# Patient Record
Sex: Female | Born: 1995 | Race: White | Hispanic: No | Marital: Single | Smoking: Current every day smoker
Health system: Southern US, Community
[De-identification: ages and names within clinical notes are randomized; demographics above are authoritative.]

## PROBLEM LIST (undated history)

## (undated) ENCOUNTER — Inpatient Hospital Stay: Payer: Self-pay

## (undated) DIAGNOSIS — F1220 Cannabis dependence, uncomplicated: Secondary | ICD-10-CM

## (undated) DIAGNOSIS — Z7289 Other problems related to lifestyle: Secondary | ICD-10-CM

## (undated) DIAGNOSIS — F17200 Nicotine dependence, unspecified, uncomplicated: Secondary | ICD-10-CM

## (undated) DIAGNOSIS — Z789 Other specified health status: Secondary | ICD-10-CM

## (undated) DIAGNOSIS — N939 Abnormal uterine and vaginal bleeding, unspecified: Secondary | ICD-10-CM

## (undated) DIAGNOSIS — F321 Major depressive disorder, single episode, moderate: Secondary | ICD-10-CM

## (undated) HISTORY — DX: Abnormal uterine and vaginal bleeding, unspecified: N93.9

## (undated) HISTORY — PX: WISDOM TOOTH EXTRACTION: SHX21

## (undated) HISTORY — PX: ORTHOPEDIC SURGERY: SHX850

---
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
## 2015-11-20 ENCOUNTER — Encounter: Payer: Self-pay | Admitting: Emergency Medicine

## 2015-11-20 ENCOUNTER — Emergency Department
Admission: EM | Admit: 2015-11-20 | Discharge: 2015-11-20 | Disposition: A | Discharge status: Home / Self Care | Payer: Self-pay | Attending: Emergency Medicine | Admitting: Emergency Medicine

## 2015-11-20 DIAGNOSIS — R112 Nausea with vomiting, unspecified: Secondary | ICD-10-CM | POA: Insufficient documentation

## 2015-11-20 DIAGNOSIS — F17200 Nicotine dependence, unspecified, uncomplicated: Secondary | ICD-10-CM | POA: Insufficient documentation

## 2015-11-20 DIAGNOSIS — J069 Acute upper respiratory infection, unspecified: Secondary | ICD-10-CM | POA: Insufficient documentation

## 2015-11-20 LAB — POCT RAPID STREP A: Streptococcus, Group A Screen (Direct): NEGATIVE

## 2015-11-20 MED ORDER — BENZONATATE 100 MG PO CAPS: 200.0000 mg | ORAL_CAPSULE | Freq: Three times a day (TID) | ORAL | 0 refills | Status: DC | PRN

## 2015-11-20 NOTE — Discharge Instructions (Signed)
[facility] if any continued problems. Increase fluids. Take Tessalon as needed for cough. Take Tylenol or ibuprofen if needed for throat pain or fever.

## 2015-11-20 NOTE — ED Notes (Signed)
Pt to ed with c/o sore throat x 3 days, cough, congestion, white colored sputum, denies fever.  Pt in no acute respiratory distress at this time.  Skin warm and dry.

## 2015-11-20 NOTE — ED Triage Notes (Addendum)
Pt with sore throat x 1 week with fever a couple of days ago. Woke up this am with nausea and emesis x 1. Non productive cough x 3-4 days.

## 2015-11-20 NOTE — ED Provider Notes (Signed)
[facility] Emergency Department Provider Note   ____________________________________________   First MD Initiated Contact with Patient 11/20/15 1216     (approximate)  I have reviewed the triage vital signs and the nursing notes.   HISTORY  Chief Complaint Nausea and Emesis   HPI [name] is a 20 y.o. female is here with complaint of sore throat for [redacted] week along with fever for couple days. Patient states that there has been some nausea and she has vomited once. She also has a runny nose and a nonproductive cough for the last 3-4 days.Patient is not taking any over-the-counter medication. She states that when she did vomit it was during a "coughing spell". She states cough is worse at night.   History reviewed. No pertinent past medical history.  There are no active problems to display for this patient.   Past Surgical History:  Procedure Laterality Date  . ORTHOPEDIC SURGERY Right arm    Prior to Admission medications   Medication Sig Start Date End Date Taking? Authorizing Provider  benzonatate (TESSALON PERLES) 100 MG capsule Take 2 capsules (200 mg total) by mouth 3 (three) times daily as needed for cough. 11/20/15 11/19/16  Tommi Rumps, PA-C  fluconazole (DIFLUCAN) 150 MG tablet Take 1 tablet (150 mg total) by mouth daily. 12/19/14   Charmayne Sheer Beers, PA-C  sulfamethoxazole-trimethoprim (BACTRIM DS,SEPTRA DS) 800-160 MG per tablet Take 1 tablet by mouth 2 (two) times daily. 12/19/14   Evangeline Dakin, PA-C    Allergies Review of patient's allergies indicates no known allergies.  History reviewed. No pertinent family history.  Social History Social History  Substance Use Topics  . Smoking status: Current Every Day Smoker  . Smokeless tobacco: Not on file  . Alcohol use No    Review of Systems Constitutional: Positive fever/negative chills Eyes: No visual changes. ENT: Positive sore throat. Cardiovascular: Denies chest  pain. Respiratory: Denies shortness of breath. Gastrointestinal: No abdominal pain.  Positive nausea, positive vomiting 1.  Genitourinary: Negative for dysuria. Musculoskeletal: Negative for back pain. Skin: Negative for rash. Neurological: Negative for headaches, focal weakness or numbness.  10-point ROS otherwise negative.  ____________________________________________   PHYSICAL EXAM:  VITAL SIGNS: ED Triage Vitals  Enc Vitals Group     BP 11/20/15 1202 121/70     Pulse Rate 11/20/15 1202 (!) 117     Resp --      Temp 11/20/15 1202 98.6 F (37 C)     Temp Source 11/20/15 1202 Oral     SpO2 11/20/15 1202 92 %     Weight 11/20/15 1207 90 lb (40.8 kg)     Height 11/20/15 1207  (1.626 m)     Head Circumference --      Peak Flow --      Pain Score --      Pain Loc --      Pain Edu? --      Excl. in GC? --     Constitutional: Alert and oriented. Well appearing and in no acute distress. Eyes: Conjunctivae are normal. PERRL. EOMI. Head: Atraumatic. Nose: Mild congestion/rhinnorhea.  EACs are clear bilaterally. TMs are dull bilaterally without erythema or injection. Mouth/Throat: Mucous membranes are moist.  Oropharynx non-erythematous. No exudate or tonsillar enlargement noted. Neck: No stridor.   Hematological/Lymphatic/Immunilogical: No cervical lymphadenopathy. Cardiovascular: Normal rate, regular rhythm. Grossly normal heart sounds.  Good peripheral circulation. Respiratory: Normal respiratory effort.  No retractions. Lungs CTAB. Musculoskeletal:Moves upper and lower extremities  without any difficulty. Normal gait was noted. Neurologic:  Normal speech and language. No gross focal neurologic deficits are appreciated. No gait instability. Skin:  Skin is warm, dry and intact. No rash noted. Psychiatric: Mood and affect are normal. Speech and behavior are normal.  ____________________________________________   LABS (all labs ordered are listed, but only abnormal  results are displayed)  Labs Reviewed  POCT RAPID STREP A     PROCEDURES  Procedure(s) performed: None  Procedures  Critical Care performed: No  ____________________________________________   INITIAL IMPRESSION / ASSESSMENT AND PLAN / ED COURSE  Pertinent labs & imaging results that were available during my care of the patient were reviewed by me and considered in my medical decision making (see chart for details).    Clinical Course   Patient is encouraged take Tylenol or ibuprofen if needed for throat pain. She is to increase fluids. She is also given a prescription for Tessalon Perles 1 or 2 every 8 hours if needed for cough. [facility] if any continued problems. Patient requested a note for work as she did not go to work today.  ____________________________________________   FINAL CLINICAL IMPRESSION(S) / ED DIAGNOSES  Final diagnoses:  Acute upper respiratory infection      NEW MEDICATIONS STARTED DURING THIS VISIT:  New Prescriptions   BENZONATATE (TESSALON PERLES) 100 MG CAPSULE    Take 2 capsules (200 mg total) by mouth 3 (three) times daily as needed for cough.     Note:  This document was prepared using Dragon voice recognition software and may include unintentional dictation errors.    Tommi Rumpshonda L Summers, PA-C 11/20/15 1302    Emily FilbertJonathan E Williams, MD 11/20/15 [phone]

## 2016-05-03 ENCOUNTER — Emergency Department
Admission: EM | Admit: 2016-05-03 | Discharge: 2016-05-03 | Disposition: A | Discharge status: Home / Self Care | Payer: Self-pay | Attending: Emergency Medicine | Admitting: Emergency Medicine

## 2016-05-03 ENCOUNTER — Encounter: Payer: Self-pay | Admitting: Emergency Medicine

## 2016-05-03 DIAGNOSIS — F17200 Nicotine dependence, unspecified, uncomplicated: Secondary | ICD-10-CM | POA: Insufficient documentation

## 2016-05-03 DIAGNOSIS — J101 Influenza due to other identified influenza virus with other respiratory manifestations: Secondary | ICD-10-CM

## 2016-05-03 DIAGNOSIS — J09X2 Influenza due to identified novel influenza A virus with other respiratory manifestations: Secondary | ICD-10-CM | POA: Insufficient documentation

## 2016-05-03 DIAGNOSIS — J111 Influenza due to unidentified influenza virus with other respiratory manifestations: Secondary | ICD-10-CM

## 2016-05-03 LAB — INFLUENZA PANEL BY PCR (TYPE A & B)
Influenza A By PCR: POSITIVE — AB
Influenza B By PCR: NEGATIVE

## 2016-05-03 MED ORDER — OSELTAMIVIR PHOSPHATE 75 MG PO CAPS: 75.0000 mg | ORAL_CAPSULE | Freq: Two times a day (BID) | ORAL | 0 refills | Status: AC

## 2016-05-03 MED ORDER — SODIUM CHLORIDE 0.9 % IV BOLUS (SEPSIS)
1000.0000 mL | Freq: Once | INTRAVENOUS | Status: AC
  Administered 2016-05-03: 1000 mL via INTRAVENOUS

## 2016-05-03 MED ORDER — BENZONATATE 100 MG PO CAPS: 100.0000 mg | ORAL_CAPSULE | Freq: Three times a day (TID) | ORAL | 0 refills | Status: AC | PRN

## 2016-05-03 NOTE — ED Notes (Signed)

## 2016-05-03 NOTE — ED Provider Notes (Signed)
[facility] Emergency Department Provider Note  ____________________________________________  Time seen: Approximately 4:27 PM  I have reviewed the triage vital signs and the nursing notes.   HISTORY  Chief Complaint Generalized Body Aches    HPI [name] is a 21 y.o. female presenting to the emergency department with rhinorrhea and nonproductive cough, malaise, fatigue, congestion, fever, frontal headache and vomiting for the past 2 days. Fever has been as high as 101F assessed orally. Patient has tried ibuprofen but has attempted no other alleviating measures. Patient has had a diminished appetite and has not been drinking as much as usual. No recent travel. She denies chest pain, chest tightness, nausea, dysuria, hematuria or diarrhea. Patient has numerous sick contacts at school. Patient is currently studying to be a Engineer, civil (consulting)nurse.    History reviewed. No pertinent past medical history.  There are no active problems to display for this patient.   Past Surgical History:  Procedure Laterality Date  . ORTHOPEDIC SURGERY Right arm    Prior to Admission medications   Medication Sig Start Date End Date Taking? Authorizing Provider  benzonatate (TESSALON PERLES) 100 MG capsule Take 1 capsule (100 mg total) by mouth 3 (three) times daily as needed for cough. 05/03/16 05/10/16  Orvil FeilJaclyn M Wynette Jersey, PA-C  fluconazole (DIFLUCAN) 150 MG tablet Take 1 tablet (150 mg total) by mouth daily. 12/19/14   Evangeline Dakinharles M Beers, PA-C  oseltamivir (TAMIFLU) 75 MG capsule Take 1 capsule (75 mg total) by mouth 2 (two) times daily. 05/03/16 05/08/16  Orvil FeilJaclyn M Luvena Wentling, PA-C  sulfamethoxazole-trimethoprim (BACTRIM DS,SEPTRA DS) 800-160 MG per tablet Take 1 tablet by mouth 2 (two) times daily. 12/19/14   Evangeline Dakinharles M Beers, PA-C    Allergies Patient has no known allergies.  History reviewed. No pertinent family history.  Social History Social History  Substance Use Topics  . Smoking status: Current  Every Day Smoker  . Smokeless tobacco: Not on file  . Alcohol use No     Review of Systems  Constitutional: Patient has had fever.  Eyes: No visual changes. No discharge ENT: Patient has had congestion.  Cardiovascular: no chest pain. Respiratory: Patient has had non-productive cough.  No SOB. Gastrointestinal: Patient has had nausea and vomiting Genitourinary: Negative for dysuria. No hematuria Musculoskeletal: Patient has had myalgias. Skin: Negative for rash, abrasions, lacerations, ecchymosis. Neurological: Patient has had headache, no focal weakness or numbness. 10-point ROS otherwise negative. ____________________________________________   PHYSICAL EXAM:  VITAL SIGNS: ED Triage Vitals  Enc Vitals Group     BP 05/03/16 1503 116/81     Pulse Rate 05/03/16 1503 (!) 135     Resp 05/03/16 1503 18     Temp 05/03/16 1503 98.4 F (36.9 C)     Temp Source 05/03/16 1503 Oral     SpO2 05/03/16 1503 98 %     Weight 05/03/16 1501 90 lb (40.8 kg)     Height 05/03/16 1501 5\' 4"  (1.626 m)     Head Circumference --      Peak Flow --      Pain Score 05/03/16 1501 8     Pain Loc --      Pain Edu? --      Excl. in GC? --    Constitutional: Alert and oriented. Patient is lying supine in bed.  Eyes: Conjunctivae are normal. PERRL. EOMI. Head: Atraumatic. ENT:      Ears: Tympanic membranes are injected bilaterally without evidence of effusion or purulent exudate. Bony landmarks are  visualized bilaterally. No pain with palpation at the tragus.      Nose: Nasal turbinates are edematous and erythematous. Trace rhinorrhea visualized.      Mouth/Throat: Mucous membranes are moist. Posterior pharynx is mildly erythematous. No tonsillar hypertrophy or purulent exudate. Uvula is midline. Neck: Full range of motion. No pain is elicited with flexion at the neck. Hematological/Lymphatic/Immunilogical: No cervical lymphadenopathy. Cardiovascular: Tachycardia, regular rhythm. Normal S1 and S2.   Good peripheral circulation. Respiratory: Normal respiratory effort without tachypnea or retractions. Lungs CTAB. Good air entry to the bases with no decreased or absent breath sounds. Gastrointestinal: Bowel sounds 4 quadrants. Soft and nontender to palpation. No guarding or rigidity. No palpable masses. No distention. No CVA tenderness.  Skin:  Skin is warm, dry and intact. No rash noted. Psychiatric: Mood and affect are normal. Speech and behavior are normal. Patient exhibits appropriate insight and judgement.   ____________________________________________   LABS (all labs ordered are listed, but only abnormal results are displayed)  Labs Reviewed  INFLUENZA PANEL BY PCR (TYPE A & B) - Abnormal; Notable for the following:       Result Value   Influenza A By PCR POSITIVE (*)    All other components within normal limits   ____________________________________________  EKG   ____________________________________________  RADIOLOGY   No results found.  ____________________________________________    PROCEDURES  Procedure(s) performed:    Procedures    Medications  sodium chloride 0.9 % bolus 1,000 mL (0 mLs Intravenous Stopped 05/03/16 1731)     ____________________________________________   INITIAL IMPRESSION / ASSESSMENT AND PLAN / ED COURSE  Pertinent labs & imaging results that were available during my care of the patient were reviewed by me and considered in my medical decision making (see chart for details).  Review of the [place] CSRS was performed in accordance of the [place] prior to dispensing any controlled drugs.     Assessment and Plan: Influenza A Patient presents to the emergency department with rhinorrhea,nonproductive cough, malaise, fatigue, congestion, fever, frontal headache and vomiting for the past 2 days. Patient tested positive for influenza A in the emergency department. She received IV fluids for suspected dehydration. Tachycardia largely  resolved after supplemental fluids. Rest and hydration was encouraged. Patient was advised to follow-up with her PCP in one week. Strict return precautions were given to patient twice. All patient questions were answered. Physical exam and vital signs are reassuring at this time. ____________________________________________  FINAL CLINICAL IMPRESSION(S) / ED DIAGNOSES  Final diagnoses:  Influenza  Influenza A      NEW MEDICATIONS STARTED DURING THIS VISIT:  Discharge Medication List as of 05/03/2016  5:35 PM    START taking these medications   Details  oseltamivir (TAMIFLU) 75 MG capsule Take 1 capsule (75 mg total) by mouth 2 (two) times daily., Starting Sun 05/03/2016, Until Fri 05/08/2016, Print            This chart was dictated using voice recognition software/Dragon. Despite best efforts to proofread, errors can occur which can change the meaning. Any change was purely unintentional.    Orvil Feil, PA-C 05/03/16 2243    Nita Sickle, MD 05/05/16 [phone]

## 2016-05-03 NOTE — ED Triage Notes (Signed)
Pt c/o fever of 101, body aches, congestion, runny nose and cough for 2 days. Ambulatory to triage.

## 2016-05-03 NOTE — ED Notes (Signed)
Pt presents to ED with c/o generalized body aches that started approx 2 days ago. Pt c/o fever and vomiting that started yesterday. Pt reports 1 episode of vomiting yesterday. Pt also c/o sore throat and cough at this time.

## 2016-09-17 ENCOUNTER — Encounter: Payer: Self-pay | Admitting: Emergency Medicine

## 2016-09-17 ENCOUNTER — Emergency Department
Admission: EM | Admit: 2016-09-17 | Discharge: 2016-09-17 | Disposition: A | Discharge status: Other Acute Inpatient Hospital | Payer: Self-pay | Attending: Emergency Medicine | Admitting: Emergency Medicine

## 2016-09-17 ENCOUNTER — Inpatient Hospital Stay
Admission: EM | Admit: 2016-09-17 | Discharge: 2016-09-19 | DRG: 885 | Disposition: A | Discharge status: Home / Self Care | Payer: No Typology Code available for payment source | Source: Intra-hospital | Attending: Psychiatry | Admitting: Psychiatry

## 2016-09-17 DIAGNOSIS — F17200 Nicotine dependence, unspecified, uncomplicated: Secondary | ICD-10-CM | POA: Insufficient documentation

## 2016-09-17 DIAGNOSIS — R45851 Suicidal ideations: Secondary | ICD-10-CM | POA: Diagnosis present

## 2016-09-17 DIAGNOSIS — Z7289 Other problems related to lifestyle: Secondary | ICD-10-CM | POA: Diagnosis present

## 2016-09-17 DIAGNOSIS — G4700 Insomnia, unspecified: Secondary | ICD-10-CM | POA: Diagnosis present

## 2016-09-17 DIAGNOSIS — F1220 Cannabis dependence, uncomplicated: Secondary | ICD-10-CM | POA: Diagnosis present

## 2016-09-17 DIAGNOSIS — N760 Acute vaginitis: Secondary | ICD-10-CM | POA: Diagnosis present

## 2016-09-17 DIAGNOSIS — S61519A Laceration without foreign body of unspecified wrist, initial encounter: Secondary | ICD-10-CM | POA: Diagnosis present

## 2016-09-17 DIAGNOSIS — Z915 Personal history of self-harm: Secondary | ICD-10-CM | POA: Insufficient documentation

## 2016-09-17 DIAGNOSIS — X789XXA Intentional self-harm by unspecified sharp object, initial encounter: Secondary | ICD-10-CM | POA: Diagnosis present

## 2016-09-17 DIAGNOSIS — F4310 Post-traumatic stress disorder, unspecified: Secondary | ICD-10-CM | POA: Diagnosis present

## 2016-09-17 DIAGNOSIS — Z811 Family history of alcohol abuse and dependence: Secondary | ICD-10-CM | POA: Diagnosis not present

## 2016-09-17 DIAGNOSIS — F32A Depression, unspecified: Secondary | ICD-10-CM

## 2016-09-17 DIAGNOSIS — S41112A Laceration without foreign body of left upper arm, initial encounter: Secondary | ICD-10-CM | POA: Diagnosis present

## 2016-09-17 DIAGNOSIS — Z79899 Other long term (current) drug therapy: Secondary | ICD-10-CM

## 2016-09-17 DIAGNOSIS — N390 Urinary tract infection, site not specified: Secondary | ICD-10-CM | POA: Diagnosis present

## 2016-09-17 DIAGNOSIS — IMO0002 Reserved for concepts with insufficient information to code with codable children: Secondary | ICD-10-CM

## 2016-09-17 DIAGNOSIS — F332 Major depressive disorder, recurrent severe without psychotic features: Secondary | ICD-10-CM

## 2016-09-17 DIAGNOSIS — Z818 Family history of other mental and behavioral disorders: Secondary | ICD-10-CM | POA: Diagnosis not present

## 2016-09-17 DIAGNOSIS — Z560 Unemployment, unspecified: Secondary | ICD-10-CM

## 2016-09-17 DIAGNOSIS — F329 Major depressive disorder, single episode, unspecified: Secondary | ICD-10-CM | POA: Insufficient documentation

## 2016-09-17 DIAGNOSIS — F321 Major depressive disorder, single episode, moderate: Secondary | ICD-10-CM | POA: Diagnosis present

## 2016-09-17 HISTORY — DX: Other problems related to lifestyle: Z72.89

## 2016-09-17 HISTORY — DX: Nicotine dependence, unspecified, uncomplicated: F17.200

## 2016-09-17 HISTORY — DX: Cannabis dependence, uncomplicated: F12.20

## 2016-09-17 LAB — PREGNANCY, URINE: Preg Test, Ur: NEGATIVE

## 2016-09-17 LAB — URINE DRUG SCREEN, QUALITATIVE (ARMC ONLY)
Amphetamines, Ur Screen: NOT DETECTED
Barbiturates, Ur Screen: NOT DETECTED
Benzodiazepine, Ur Scrn: POSITIVE — AB
Cannabinoid 50 Ng, Ur ~~LOC~~: POSITIVE — AB
Cocaine Metabolite,Ur ~~LOC~~: NOT DETECTED
MDMA (ECSTASY) UR SCREEN: NOT DETECTED
Methadone Scn, Ur: NOT DETECTED
OPIATE, UR SCREEN: NOT DETECTED
PHENCYCLIDINE (PCP) UR S: NOT DETECTED
Tricyclic, Ur Screen: NOT DETECTED

## 2016-09-17 LAB — CBC
HEMATOCRIT: 43.1 % (ref 35.0–47.0)
Hemoglobin: 14.8 g/dL (ref 12.0–16.0)
MCH: 32.8 pg (ref 26.0–34.0)
MCHC: 34.2 g/dL (ref 32.0–36.0)
MCV: 95.9 fL (ref 80.0–100.0)
Platelets: 212 10*3/uL (ref 150–440)
RBC: 4.5 MIL/uL (ref 3.80–5.20)
RDW: 13.4 % (ref 11.5–14.5)
WBC: 9.1 10*3/uL (ref 3.6–11.0)

## 2016-09-17 LAB — URINALYSIS, COMPLETE (UACMP) WITH MICROSCOPIC
Bacteria, UA: NONE SEEN
Bilirubin Urine: NEGATIVE
Glucose, UA: NEGATIVE mg/dL
KETONES UR: NEGATIVE mg/dL
Nitrite: NEGATIVE
Protein, ur: NEGATIVE mg/dL
Specific Gravity, Urine: 1.017 (ref 1.005–1.030)
pH: 5 (ref 5.0–8.0)

## 2016-09-17 LAB — COMPREHENSIVE METABOLIC PANEL
ALT: 12 U/L — ABNORMAL LOW (ref 14–54)
ANION GAP: 8 (ref 5–15)
AST: 22 U/L (ref 15–41)
Albumin: 5 g/dL (ref 3.5–5.0)
Alkaline Phosphatase: 71 U/L (ref 38–126)
BUN: 9 mg/dL (ref 6–20)
CO2: 24 mmol/L (ref 22–32)
Calcium: 9.4 mg/dL (ref 8.9–10.3)
Chloride: 105 mmol/L (ref 101–111)
Creatinine, Ser: 0.65 mg/dL (ref 0.44–1.00)
GFR calc non Af Amer: >60 mL/min (ref >60)
Glucose, Bld: 90 mg/dL (ref 65–99)
Potassium: 3.5 mmol/L (ref 3.5–5.1)
SODIUM: 137 mmol/L (ref 135–145)
Total Bilirubin: 0.8 mg/dL (ref 0.3–1.2)
Total Protein: 8.6 g/dL — ABNORMAL HIGH (ref 6.5–8.1)

## 2016-09-17 LAB — ETHANOL: Alcohol, Ethyl (B): <5 mg/dL (ref <5)

## 2016-09-17 MED ORDER — ALUM & MAG HYDROXIDE-SIMETH 200-200-20 MG/5ML PO SUSP: 30.0000 mL | ORAL | Status: DC | PRN

## 2016-09-17 MED ORDER — HYDROXYZINE HCL 25 MG PO TABS
25.0000 mg | ORAL_TABLET | Freq: Three times a day (TID) | ORAL | Status: DC | PRN
  Administered 2016-09-17: 25 mg via ORAL
  Filled 2016-09-17: qty 1

## 2016-09-17 MED ORDER — MAGNESIUM HYDROXIDE 400 MG/5ML PO SUSP: 30.0000 mL | Freq: Every day | ORAL | Status: DC | PRN

## 2016-09-17 MED ORDER — ACETAMINOPHEN 325 MG PO TABS
650.0000 mg | ORAL_TABLET | Freq: Four times a day (QID) | ORAL | Status: DC | PRN
  Administered 2016-09-17: 650 mg via ORAL
  Filled 2016-09-17: qty 2

## 2016-09-17 MED ORDER — FOSFOMYCIN TROMETHAMINE 3 G PO PACK
3.0000 g | PACK | Freq: Once | ORAL | Status: AC
  Administered 2016-09-17: 3 g via ORAL
  Filled 2016-09-17: qty 3

## 2016-09-17 MED ORDER — TRAZODONE HCL 100 MG PO TABS
100.0000 mg | ORAL_TABLET | Freq: Every evening | ORAL | Status: DC | PRN
  Administered 2016-09-17: 100 mg via ORAL
  Filled 2016-09-17: qty 1

## 2016-09-17 MED ORDER — NICOTINE 14 MG/24HR TD PT24
14.0000 mg | MEDICATED_PATCH | Freq: Every day | TRANSDERMAL | Status: DC
  Administered 2016-09-17: 14 mg via TRANSDERMAL
  Filled 2016-09-17: qty 1

## 2016-09-17 MED ORDER — NICOTINE 21 MG/24HR TD PT24
21.0000 mg | MEDICATED_PATCH | Freq: Every day | TRANSDERMAL | Status: DC
  Administered 2016-09-18 – 2016-09-19 (×2): 21 mg via TRANSDERMAL
  Filled 2016-09-17 (×2): qty 1
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
## 2016-09-17 NOTE — ED Notes (Signed)
BEHAVIORAL HEALTH ROUNDING Patient sleeping: No. Patient alert and oriented: yes Behavior appropriate: Yes.  ; If no, describe:  Nutrition and fluids offered: yes Toileting and hygiene offered: Yes  Sitter present: q15 minute observations and security  monitoring Law enforcement present: Yes  ODS  

## 2016-09-17 NOTE — ED Notes (Signed)
[facility] by Dr. Toni Amendlapacs.  Attending Physician will be Dr. Jennet MaduroPucilowska.   Patient has been [facility] to room 306A, [facility] Gwen.   Intake Paper Work has been signed and placed on patient chart.  ER staff is aware of the admission [facility]( [facility].; Dr ER MD; Vikki PortsValerie Patient's Nurse & Byrd HesselbachMaria Patient Access).
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
## 2016-09-17 NOTE — ED Notes (Addendum)
Pt dressed out into appropriate behavioral health clothing. Pt belongings consist of a pack of marlboro cigarettes,a black cell phone, a white lighter,a green lighter, brown boots, a pink shirt, a white necklace with a cross charm, a pair of white earrings with clear stones, a white nose ring with a clear stone, two navel rings one blue and other is pink and black, a orange bra, a blue sports bra, green panties and a pair of yellow socks.  Pt has a dermal piercing in her back that is unable to take out.

## 2016-09-17 NOTE — Progress Notes (Signed)
Pt also requested to be possibly screened for STD's she complained of discharge and foul odor.
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
## 2016-09-17 NOTE — ED Notes (Signed)
Report given to Tai, RN, BMU

## 2016-09-17 NOTE — ED Notes (Signed)
Pt unable to urinate at this moment, given specimen cup to hold on to for once she feels the urge to void.

## 2016-09-17 NOTE — Tx Team (Signed)
Initial Treatment Plan 09/17/2016 4:31 PM Christy BoutonKendra Warner ZOX:[id number]    PATIENT STRESSORS: Educational concerns Financial difficulties Marital or family conflict Substance abuse   PATIENT STRENGTHS: Ability for insight Average or above average intelligence Capable of independent living Communication skills General fund of knowledge Motivation for treatment/growth Physical Health Supportive family/friends   PATIENT IDENTIFIED PROBLEMS: Depression   PTSD  Anxiety                  DISCHARGE CRITERIA:  Ability to meet basic life and health needs Improved stabilization in mood, thinking, and/or behavior Safe-care adequate arrangements made  PRELIMINARY DISCHARGE PLAN: Attend aftercare/continuing care group Return to previous living arrangement  PATIENT/FAMILY INVOLVEMENT: This treatment plan has been presented to and reviewed with the patient, Christy BoutonKendra Warner, and/or family member, .  The patient and family have been given the opportunity to ask questions and make suggestions.  [name]  Christy Stallone, RN 09/17/2016, 4:31 PM
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
## 2016-09-17 NOTE — ED Notes (Signed)
Patient transferred to lower level, BMU accompanied by staff with all belongings. Pt stable at this time and denies SI/HI and A/V hallucinations.

## 2016-09-17 NOTE — ED Notes (Signed)

## 2016-09-17 NOTE — Plan of Care (Signed)
Problem: Safety: Goal: Ability to remain free from injury will improve Outcome: Progressing Pt commits to safety upon admission during hospital stay

## 2016-09-17 NOTE — ED Notes (Signed)
Clapacs at bedside 

## 2016-09-18 ENCOUNTER — Encounter: Payer: Self-pay | Admitting: Psychiatry

## 2016-09-18 DIAGNOSIS — F321 Major depressive disorder, single episode, moderate: Secondary | ICD-10-CM

## 2016-09-18 HISTORY — DX: Major depressive disorder, single episode, moderate: F32.1

## 2016-09-18 LAB — LIPID PANEL
CHOLESTEROL: 155 mg/dL (ref 0–200)
HDL: 55 mg/dL (ref >40)
LDL Cholesterol: 79 mg/dL (ref 0–99)
Total CHOL/HDL Ratio: 2.8 RATIO
Triglycerides: 107 mg/dL (ref <150)
VLDL: 21 mg/dL (ref 0–40)

## 2016-09-18 LAB — TSH: TSH: 2.929 u[IU]/mL (ref 0.350–4.500)

## 2016-09-18 LAB — CHLAMYDIA/NGC RT PCR (ARMC ONLY)
CHLAMYDIA TR: NOT DETECTED
N GONORRHOEAE: NOT DETECTED

## 2016-09-18 MED ORDER — TRAZODONE HCL 50 MG PO TABS
50.0000 mg | ORAL_TABLET | Freq: Every evening | ORAL | Status: DC | PRN
  Filled 2016-09-18: qty 1

## 2016-09-18 NOTE — Progress Notes (Signed)
D: Patient stated slept good last night .Stated appetite is good and energy level  Is normal. Stated concentration is good . Stated on Depression scale , hopeless and anxiety .( low 0-10 high) Denies suicidal  homicidal ideations  .  No auditory hallucinations  No pain concerns . Appropriate ADL'S. Interacting with peers and staff. Voice fof working on coping skills  Patient signed AMA today at [id number] A: Encourage patient participation with unit programming . Instruction  Given on  Medication , verbalize understanding. R: Voice no other concerns. Staff continue to monitor
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
## 2016-09-18 NOTE — Plan of Care (Signed)
Problem: Coping: Goal: Ability to cope will improve Outcome: Progressing Working on coping skills   

## 2016-09-18 NOTE — Plan of Care (Signed)
Problem: Activity: Goal: Sleeping patterns will improve Outcome: Progressing Patient slept for Estimated Hours of 6.0; q15 minutes safety round maintained, no injury or falls during this shift.

## 2016-09-18 NOTE — Progress Notes (Signed)
   09/18/16 0945  Clinical Encounter Type  Visited With Patient  Visit Type Initial;Spiritual support  Referral From Nurse;Physician  Spiritual Encounters  Spiritual Needs Prayer;Emotional  Stress Factors  Patient Stress Factors Not reviewed  Family Stress Factors Not reviewed   Chaplain responded to consult requests to offer spiritual care and emotional support to patient. Patient was in room eating a snack when Chaplain responded. Patient asked for prayer and said she didn't need anything and didn't know why Chaplain was there. I explained to patient the services and programs of the Chaplain and patient stated she would have nurse page if needed. Prayer and educational services provided for patient.   Nona Dellhristina J Addelyn Alleman, Chaplain

## 2016-09-18 NOTE — [facility] Group Notes (Signed)
[facility] LCSW Group Therapy Note  Date/Time: 09/18/16, 1300  Type of Therapy and Topic:  Group Therapy:  Feelings around Relapse and Recovery  Participation Level:  Active   Mood: pleasant  Description of Group:    Patients in this group will discuss emotions they experience before and after a relapse. They will process how experiencing these feelings, or avoidance of experiencing them, relates to having a relapse. Facilitator will guide patients to explore emotions they have related to recovery. Patients will be encouraged to process which emotions are more powerful. They will be guided to discuss the emotional reaction significant others in their lives may have to patients' relapse or recovery. Patients will be assisted in exploring ways to respond to the emotions of others without this contributing to a relapse.  Therapeutic Goals: 1. Patient will identify two or more emotions that lead to relapse for them:  2. Patient will identify two emotions that result when they relapse:  3. Patient will identify two emotions related to recovery:  4. Patient will demonstrate ability to communicate their needs through discussion and/or role plays.   Summary of Patient Progress: Pt identified anger and lonliness as emotions that can lead to relapse.  Pt was active participant in the discussion about relapse and how feelings of others can lead to relapse as well as personal feelings.  Good participation.     Therapeutic Modalities:   Cognitive Behavioral Therapy Solution-Focused Therapy Assertiveness Training Relapse Prevention Therapy  Daleen SquibbGreg Lynsee Wands, LCSW

## 2016-09-18 NOTE — Progress Notes (Signed)
NUTRITION ASSESSMENT  Pt identified as at risk on the Malnutrition Screen Tool  INTERVENTION: 1. Educated patient on the importance of nutrition and encouraged intake of food and beverages. 2. Discussed weight goals. 3. Encouraged patient to drink milk at each meal.  NUTRITION DIAGNOSIS: Unintentional weight loss related to sub-optimal intake as evidenced by pt report.   Goal: Pt to meet >/= 90% of their estimated nutrition needs.  Monitor:  PO intake  Assessment:  21 y.o. female who presented with worsening depression and self injury.  Patient reports has a good appetite. Her UBW is 90 lbs and she has lost 2 lbs lately due to stress and nervousness. Reports she continues to eat well, though. Finishing 90-100% of meals here and also eating snacks. Patient reports she has always been small and so have her mother and father. Although patient is underweight per BMI, do not visualize any noticeable fat or muscle wasting in face, clavicles, or arms that can be seen during conversation. Patient reports she is interested in gaining some weight in a health way. She reports she has considered nutrition supplements but will not be able to afford them regularly. Discussed eating well-balanced meals with carbohydrate, adequate protein, and fruits and vegetables. Patient also really enjoys milk. Encouraged her to drink milk TID at meals. Encouraged ongoing activity with increased intake of calories and protein for healthy weight gain of lean body mass. Denise any N/V, abdominal pain.  Height: Ht Readings from Last 1 Encounters:  09/17/16 5\' 4"  (1.626 m)    Weight: Wt Readings from Last 1 Encounters:  09/17/16 88 lb (39.9 kg)    Weight Hx: Wt Readings from Last 10 Encounters:  09/17/16 88 lb (39.9 kg)  09/17/16 90 lb (40.8 kg)  05/03/16 90 lb (40.8 kg)  11/20/15 90 lb (40.8 kg)  12/19/14 88 lb (39.9 kg) (<1 %, Z= -3.07)*  11/23/14 88 lb (39.9 kg) (<1 %, Z= -3.07)*  11/19/14 88 lb (39.9 kg)  (<1 %, Z= -3.07)*   * Growth percentiles are based on CDC 2-20 Years data.    BMI:  Body mass index is 15.11 kg/m. Pt meets criteria for underweight based on current BMI. One of patient's goals is weight gain. Agree that healthy weight gain would benefit patient as she is underweight.  Estimated Nutritional Needs: Kcal: 25-30 kcal/kg Protein: > 1 gram protein/kg Fluid: 1 ml/kcal  Diet Order: Diet regular Room service appropriate? Yes; Fluid consistency: Thin Pt is also offered choice of unit snacks mid-morning and mid-afternoon.  Pt is eating as desired.   Lab results and medications reviewed.   Helane RimaLeanne Raun Routh, MS, RD, LDN Pager: [phone] After Hours Pager: [phone]

## 2016-09-18 NOTE — [facility] Suicide Risk Assessment (Addendum)
[facility]   Nursing information obtained from:  Patient Demographic factors:    Current Mental Status:    Loss Factors:    Historical Factors:    Risk Reduction Factors:     Total Time spent with patient: 30 minutes Principal Problem: Severe recurrent major depression without psychotic features (HCC) Diagnosis:   Patient Active Problem List   Diagnosis Date Noted  . Severe recurrent major depression without psychotic features (HCC) [F33.2] 09/17/2016  . PTSD (post-traumatic stress disorder) [F43.10] 09/17/2016  . Self-inflicted laceration of wrist [S61.519A] 09/17/2016  . UTI (urinary tract infection) [N39.0] 09/17/2016  . Tobacco use disorder [F17.200] 09/17/2016  . Cannabis use disorder, moderate, dependence (HCC) [F12.20] 09/17/2016   Subjective Data:   Continued Clinical Symptoms:  Alcohol Use Disorder Identification Test Final Score (AUDIT): 0 The "Alcohol Use Disorders Identification Test", Guidelines for Use in Primary Care, Second Edition.  [facility] [employer] [facility](WHO). Score between 0-7:  no or low risk or alcohol related problems. Score between 8-15:  moderate risk of alcohol related problems. Score between 16-19:  high risk of alcohol related problems. Score 20 or above:  warrants further diagnostic evaluation for alcohol dependence and treatment.   CLINICAL FACTORS:   Severe Anxiety and/or Agitation Depression:   Impulsivity Alcohol/Substance Abuse/Dependencies Previous Psychiatric Diagnoses and Treatments     Psychiatric Specialty Exam: Physical Exam  ROS  Blood pressure 121/71, pulse (!) 111, temperature 98 F (36.7 C), temperature source Oral, resp. rate 18, height 5\' 4"  (1.626 m), weight 39.9 kg (88 lb), last menstrual period 08/27/2016, SpO2 100 %.Body mass index is 15.11 kg/m.                                                    Sleep:  Number of Hours: 6      COGNITIVE FEATURES THAT  CONTRIBUTE TO RISK:  Polarized thinking    SUICIDE RISK:   Moderate:  Frequent suicidal ideation with limited intensity, and duration, some specificity in terms of plans, no associated intent, good self-control, limited dysphoria/symptomatology, some risk factors present, and identifiable protective factors, including available and accessible social support.  [facility]  I certify that inpatient services furnished can reasonably be expected to improve the patient's condition.   Jimmy FootmanHernandez-Gonzalez,  Joffrey Kerce, MD 09/18/2016, 10:42 AM

## 2016-09-18 NOTE — [facility] Group Notes (Signed)
[facility] Group Notes:  (Nursing/MHT/Case Management/Adjunct)  Date:  09/18/2016  Time:  12:32 AM  Type of Therapy:  Group Therapy  Participation Level:  Active  Participation Quality:  Appropriate  Affect:  Appropriate  Cognitive:  Alert  Insight:  Good  Engagement in Group:  Engaged  Modes of Intervention:  Support  Summary of Progress/Problems:  [name] 09/18/2016, 12:32 AM

## 2016-09-18 NOTE — [facility] Suicide Risk Assessment (Signed)
[facility] INPATIENT:  Family/Significant Other Suicide Prevention Education  Suicide Prevention Education:  Education Completed; mother, Johney MaineKimberly Gulley ph#: [phone] has been identified by the patient as the family member/significant other with whom the patient will be residing, and identified as the person(s) who will aid the patient in the event of a mental health crisis (suicidal ideations/suicide attempt).  With written consent from the patient, the family member/significant other has been provided the following suicide prevention education, prior to the and/or following the discharge of the patient.  The suicide prevention education provided includes the following:  Suicide risk factors  Suicide prevention and interventions  National Suicide Hotline telephone number  [facility] assessment telephone number  [facility] Emergency Assistance 911  [facility] and/or [facility]idential [facility] Crisis [facility] telephone number  Request made of family/significant other to:  Remove weapons (e.g., guns, rifles, knives), all items previously/currently identified as safety concern.    Remove drugs/medications (over-the-counter, prescriptions, illicit drugs), all items previously/currently identified as a safety concern.  The family member/significant other verbalizes understanding of the suicide prevention education information provided.  The family member/significant other agrees to remove the items of safety concern listed above.  Lynden OxfordKadijah R Iolanda Folson, MSW, LCSW-A 09/18/2016, 2:52 PM

## 2016-09-18 NOTE — Tx Team (Signed)
Interdisciplinary Treatment and Diagnostic Plan Update  09/18/2016 Time of Session: 11:00 AM [name] MRN: [id number]  Principal Diagnosis: MDD (major depressive disorder), single episode, moderate (HCC)  Secondary Diagnoses: Principal Problem:   MDD (major depressive disorder), single episode, moderate (HCC) Active Problems:   Self-inflicted laceration of wrist   Tobacco use disorder   Cannabis use disorder, moderate, dependence (HCC)   Current Medications:  Current Facility-Administered Medications  Medication Dose Route Frequency Provider Last Rate Last Dose  . acetaminophen (TYLENOL) tablet 650 mg  650 mg Oral Q6H PRN Clapacs, Madie Reno, MD   650 mg at 09/17/16 1751  . alum & mag hydroxide-simeth (MAALOX/MYLANTA) 200-200-20 MG/5ML suspension 30 mL  30 mL Oral Q4H PRN Clapacs, John T, MD      . magnesium hydroxide (MILK OF MAGNESIA) suspension 30 mL  30 mL Oral Daily PRN Clapacs, John T, MD      . nicotine (NICODERM CQ - dosed in mg/24 hours) patch 21 mg  21 mg Transdermal Q0600 Clapacs, Madie Reno, MD   21 mg at 09/18/16 0947  . traZODone (DESYREL) tablet 50 mg  50 mg Oral QHS PRN Hildred Priest, MD       PTA Medications: No prescriptions prior to admission.    Patient Stressors: Network engineer difficulties Marital or family conflict Substance abuse  Patient Strengths: Ability for insight Average or above average intelligence Capable of independent living Curator fund of knowledge Motivation for treatment/growth Physical Health Supportive family/friends  Treatment Modalities: Medication Management, Group therapy, Case management,  1 to 1 session with clinician, Psychoeducation, Recreational therapy.   Physician Treatment Plan for Primary Diagnosis: MDD (major depressive disorder), single episode, moderate (HCC) Long Term Goal(s): Improvement in symptoms so as ready for discharge Improvement in symptoms so as ready for  discharge   Short Term Goals: Ability to identify changes in lifestyle to reduce recurrence of condition will improve Ability to verbalize feelings will improve Ability to identify and develop effective coping behaviors will improve Ability to identify triggers associated with substance abuse/mental health issues will improve  Medication Management: Evaluate patient's response, side effects, and tolerance of medication regimen.  Therapeutic Interventions: 1 to 1 sessions, Unit Group sessions and Medication administration.  Evaluation of Outcomes: Met  Physician Treatment Plan for Secondary Diagnosis: Principal Problem:   MDD (major depressive disorder), single episode, moderate (HCC) Active Problems:   Self-inflicted laceration of wrist   Tobacco use disorder   Cannabis use disorder, moderate, dependence ([place])  Long Term Goal(s): Improvement in symptoms so as ready for discharge Improvement in symptoms so as ready for discharge   Short Term Goals: Ability to identify changes in lifestyle to reduce recurrence of condition will improve Ability to verbalize feelings will improve Ability to identify and develop effective coping behaviors will improve Ability to identify triggers associated with substance abuse/mental health issues will improve     Medication Management: Evaluate patient's response, side effects, and tolerance of medication regimen.  Therapeutic Interventions: 1 to 1 sessions, Unit Group sessions and Medication administration.  Evaluation of Outcomes: Met   RN Treatment Plan for Primary Diagnosis: MDD (major depressive disorder), single episode, moderate (HCC) Long Term Goal(s): Knowledge of disease and therapeutic regimen to maintain health will improve  Short Term Goals: Ability to demonstrate self-control, Ability to disclose and discuss suicidal ideas and Compliance with prescribed medications will improve  Medication Management: RN will administer medications  as ordered by provider, will assess and evaluate patient's response and  provide education to patient for prescribed medication. RN will report any adverse and/or side effects to prescribing provider.  Therapeutic Interventions: 1 on 1 counseling sessions, Psychoeducation, Medication administration, Evaluate responses to treatment, Monitor vital signs and CBGs as ordered, Perform/monitor CIWA, COWS, AIMS and Fall Risk screenings as ordered, Perform wound care treatments as ordered.  Evaluation of Outcomes: Met   LCSW Treatment Plan for Primary Diagnosis: MDD (major depressive disorder), single episode, moderate ([place]) Long Term Goal(s): Safe transition to appropriate next level of care at discharge, Engage patient in therapeutic group addressing interpersonal concerns.  Short Term Goals: Engage patient in aftercare planning with referrals and resources, Increase social support, Increase ability to appropriately verbalize feelings and Increase emotional regulation  Therapeutic Interventions: Assess for all discharge needs, 1 to 1 time with Social worker, Explore available resources and support systems, Assess for adequacy in community support network, Educate family and significant other(s) on suicide prevention, Complete Psychosocial Assessment, Interpersonal group therapy.  Evaluation of Outcomes: Progressing   Progress in Treatment: Attending groups: Yes. Participating in groups: Yes. Taking medication as prescribed: Yes. Toleration medication: Yes. Family/Significant other contact made: Yes, individual(s) contacted:  CSW spoke with patient's mother. Patient understands diagnosis: Yes. Discussing patient identified problems/goals with staff: Yes. Medical problems stabilized or resolved: Yes. Denies suicidal/homicidal ideation: Yes. Issues/concerns per patient self-inventory: No.  New problem(s) identified: No, Describe:  None  New Short Term/Long Term Goal(s): Patient stated that her  goal is to learn how to find better coping skills.  Discharge Plan or Barriers: Patient will discharge home to her dad and f/u with [employer].  Reason for Continuation of Hospitalization: Depression Suicidal ideation  Estimated Length of Stay: Possible d/c Saturday, 6/9  Attendees: Patient: [name]  09/18/2016 3:45 PM  Physician: Dr. Merlyn Albert, MD  09/18/2016 3:45 PM  Nursing: Polly Cobia, RN 09/18/2016 3:45 PM  RN Care Manager: 09/18/2016 3:45 PM  Social Worker: Glorious Peach, MSW, [place] 09/18/2016 3:45 PM  Recreational Therapist:  09/18/2016 3:45 PM  Other:  09/18/2016 3:45 PM  Other:  09/18/2016 3:45 PM  Other: 09/18/2016 3:45 PM    Scribe for Treatment Team: Emilie Rutter, [place] 09/18/2016 3:45 PM

## 2016-09-19 ENCOUNTER — Encounter: Payer: Self-pay | Admitting: Psychiatry

## 2016-09-19 LAB — HEMOGLOBIN A1C
Hgb A1c MFr Bld: 5.1 % (ref 4.8–5.6)
Mean Plasma Glucose: 100 mg/dL

## 2016-09-19 MED ORDER — METRONIDAZOLE 250 MG PO TABS
250.0000 mg | ORAL_TABLET | Freq: Three times a day (TID) | ORAL | Status: DC
  Filled 2016-09-19 (×2): qty 1

## 2016-09-19 MED ORDER — METRONIDAZOLE 250 MG PO TABS: 250.0000 mg | ORAL_TABLET | Freq: Two times a day (BID) | ORAL | Status: DC

## 2016-09-19 MED ORDER — METRONIDAZOLE 500 MG PO TABS
500.0000 mg | ORAL_TABLET | Freq: Two times a day (BID) | ORAL | Status: DC
  Filled 2016-09-19: qty 1

## 2016-09-19 MED ORDER — METRONIDAZOLE 500 MG PO TABS: 500.0000 mg | ORAL_TABLET | Freq: Two times a day (BID) | ORAL | 1 refills | Status: DC

## 2016-09-19 MED ORDER — METRONIDAZOLE 250 MG PO TABS: 250.0000 mg | ORAL_TABLET | Freq: Three times a day (TID) | ORAL | 0 refills | Status: DC

## 2016-09-19 NOTE — [facility] Counselor (Addendum)
CSW completed assessment in notes as a progress note.  Christy PeaJonathan F. Bronislaw Switzer, Theresia MajorsLCSWA, LCAS Clinical Social Worker Ph: [phone]

## 2016-09-19 NOTE — Progress Notes (Signed)
Pt discharged home. Pt was dressed in her personal clothing upon discharged. All paperwork, medication, and appointment's, were explain to patient. Patient verbalized understanding of d/c and medication. Pt denies AH,VH,SI, and HI at this prior to d/c. Pt denies pain. All belongings account and signed by patient prior to d/c. No issues concern/voiced prior to d/c.

## 2016-09-19 NOTE — [facility] Counselor (Signed)
Adult Comprehensive Assessment Late Entry for CSW [name]  Patient ID: [name], female   DOB: Jul 16, 1995, 21 y.o.   MRN: [id number]  Information Source: Information source: Patient  Current Stressors:  Educational / Learning stressors: No stressors identified  Employment / Job issues: Pt unemployed and is seeking employment  Family Relationships: No stressors identified  Surveyor, quantity / Lack of resources (include bankruptcy): Pt experiencing stress from lack of finances  Housing / Lack of housing: No stressors identified  Physical health (include injuries & life threatening diseases): No stressors identified  Social relationships: No stressors identified  Substance abuse: Pt reports daily use of marijuana Bereavement / Loss: No stressors identified   Living/Environment/Situation:  Living Arrangements: Parent Living conditions (as described by patient or guardian): They are fine  How long has patient lived in current situation?: Few weeks  What is atmosphere in current home: Comfortable, Supportive  Family History:  Marital status: Long term relationship Long term relationship, how long?: 4 months  What types of issues is patient dealing with in the relationship?: Some arguments  Are you sexually active?: Yes What is your sexual orientation?: Heterosexual  Has your sexual activity been affected by drugs, alcohol, medication, or emotional stress?: No Does patient have children?: No  Childhood History:  By whom was/is the patient raised?: Both parents Description of patient's relationship with caregiver when they were a child: Had a great relationship with both parents  Patient's description of current relationship with people who raised him/her: Currently has a great relationship with both parents  How were you disciplined when you got in trouble as a child/adolescent?: Spankings, grounded  Does patient have siblings?: Yes Number of Siblings: 1 Description of patient's  current relationship with siblings: One older sister - has an "okay" relationship  Did patient suffer any verbal/emotional/physical/sexual abuse as a child?: No Did patient suffer from severe childhood neglect?: No Has patient ever been sexually abused/assaulted/raped as an adolescent or adult?: No Was the patient ever a victim of a crime or a disaster?: No Witnessed domestic violence?: No Has patient been effected by domestic violence as an adult?: No  Education:  Highest grade of school patient has completed: 10th grade  Currently a student?: No  Employment/Work Situation:   Employment situation: Unemployed Patient's job has been impacted by current illness: No What is the longest time patient has a held a job?: 1 year  Where was the patient employed at that time?: [employer]  Has patient ever been in the [employer]?: No Has patient ever served in combat?: No Did You Receive Any Psychiatric Treatment/Services While in Equities trader?: No Are There Guns or Other Weapons in Your Home?: No Are These Weapons Safely Secured?: No (N/A)  Financial Resources:   Financial resources: Income from spouse, Support from parents / caregiver Does patient have a representative payee or guardian?: No  Alcohol/Substance Abuse:   What has been your use of drugs/alcohol within the last 12 months?: Marijuana daily use  Alcohol/Substance Abuse Treatment Hx: Denies past history Has alcohol/substance abuse ever caused legal problems?: No  Social Support System:   Conservation officer, nature Support System: Good Describe Community Support System: Has a good family/friend support system - feels she counts on multiple people  Type of faith/religion: N/A How does patient's faith help to cope with current illness?: Reading the bible and prayer   Leisure/Recreation:   Leisure and Hobbies: Mudlogger, walking   Strengths/Needs:   What things does the patient do well?: Communication skills,  smiling, outgoing personality   In what areas does patient struggle / problems for patient: Better coping skills   Discharge Plan:   Does patient have access to transportation?: Yes Will patient be returning to same living situation after discharge?: No Plan for living situation after discharge: Return home with dad  Currently receiving community mental health services: No If no, would patient like referral for services when discharged?: Yes (What county?) ([facility] ) Does patient have financial barriers related to discharge medications?: Yes Patient description of barriers related to discharge medications: Pt referred to medication management   Summary/Recommendations:   Summary and Recommendations (to be completed by the evaluator): Patient is a 21 year old female, admitted voluntarily and diagnosed w Major Depressive Disorder.  Stressors prior to admission were worsening symptoms of depression, feeling overwhelmed and losing control.  Psychosocial stressors were relationship conflict, lack of transportation and inability to reach life goals.  No current mental health providers, [facility]  Goals of hospitalization include safety, increase emotion regulation and coping skills.    Sallee LangeAnne C Noella Kipnis. 09/19/2016

## 2016-09-19 NOTE — B[facility] Suicide Risk Assessment (Signed)
[facility]   Principal Problem: MDD (major depressive disorder), single episode, moderate (HCC) Discharge Diagnoses:  Patient Active Problem List   Diagnosis Date Noted  . MDD (major depressive disorder), single episode, moderate (HCC) [F32.1] 09/18/2016  . Self-inflicted laceration of wrist [S61.519A] 09/17/2016  . Tobacco use disorder [F17.200] 09/17/2016  . Cannabis use disorder, moderate, dependence (HCC) [F12.20] 09/17/2016    Total Time spent with patient: 30 minutes  Musculoskeletal: Strength & Muscle Tone: within normal limits Gait & Station: normal Patient leans: N/A  Psychiatric Specialty Exam: Review of Systems  Psychiatric/Behavioral: Positive for substance abuse.  All other systems reviewed and are negative.   Blood pressure 120/75, pulse 92, temperature 98.7 F (37.1 C), temperature source Oral, resp. rate 18, height 5\' 4"  (1.626 m), weight 39.9 kg (88 lb), last menstrual period 08/27/2016, SpO2 100 %.Body mass index is 15.11 kg/m.  General Appearance: Casual  Eye Contact::  Good  Speech:  Clear and Coherent409  Volume:  Normal  Mood:  Anxious  Affect:  Appropriate  Thought Process:  Goal Directed and Descriptions of Associations: Intact  Orientation:  Full (Time, Place, and Person)  Thought Content:  WDL  Suicidal Thoughts:  No  Homicidal Thoughts:  No  Memory:  Immediate;   Fair Recent;   Fair Remote;   Fair  Judgement:  Fair  Insight:  Fair  Psychomotor Activity:  Normal  Concentration:  Fair  Recall:  [employer]  Fund of Knowledge:Fair  Language: Fair  Akathisia:  No  Handed:  Right  AIMS (if indicated):     Assets:  Communication Skills Desire for Improvement Housing Physical Health Resilience Social Support  Sleep:  Number of Hours: 5.5  Cognition: WNL  ADL's:  Intact   Mental Status Per Nursing Assessment::   On Admission:     Demographic Factors:  Adolescent or young adult, Caucasian, Low socioeconomic status  and Unemployed  Loss Factors: Decrease in vocational status, Loss of significant relationship and Financial problems/change in socioeconomic status  Historical Factors: Prior suicide attempts, Family history of mental illness or substance abuse and Impulsivity  Risk Reduction Factors:   Sense of responsibility to family, Living with another person, especially a relative and Positive social support  Continued Clinical Symptoms:  Depression:   Comorbid alcohol abuse/dependence Impulsivity Alcohol/Substance Abuse/Dependencies  Cognitive Features That Contribute To Risk:  None    Suicide Risk:  Minimal: No identifiable suicidal ideation.  Patients presenting with no risk factors but with morbid ruminations; may be classified as minimal risk based on the severity of the depressive symptoms  Follow-up Information    [facility] Go on 09[facility]22/2016.   Why:  [facility] for your initial hospital discharge appointment on Tuesday, June 12th between 9AM-2PM. Please bring your discharge paperwork to this appointment. Contact information: [address] [place] [address] [phone]           Plan Of Care/Follow-up recommendations:  Activity:  as tolerated. Diet:  Regular. Other:  Keep follow-up appointments.  Kristine LineaJolanta Yashica Sterbenz, MD 09/19/2016, 7:55 AM

## 2016-09-19 NOTE — Clinical Social Work Note (Signed)
Clinical Social Work Assessment  Patient Details  Name: [name] MRN: [id number] Date of Birth: December 20, 1995  Date of referral:  09/19/16               Reason for consult:  Substance Use/ETOH Abuse, Mental Health Concerns, Emotional/Coping/Adjustment to Illness                Permission sought to share information with:  Facility Sport and exercise psychologist, Family Supports Permission granted to share information::  Yes, Verbal Permission Granted  Name::        Agency::     Relationship::     Contact Information:     Housing/Transportation Living arrangements for the past 2 months:  Single Family Home Source of Information:  Patient Patient Interpreter Needed:  None Criminal Activity/Legal Involvement Pertinent to Current Situation/Hospitalization:    Significant Relationships:  Significant Other, Parents Lives with:  Significant Other Do you feel safe going back to the place where you live?  Yes Need for family participation in patient care:  No (Coment)  Care giving concerns:  *None listed by pt/family  Social Worker assessment / plan:  CSW met with pt and confirmed pt's plan to be discharged to her father's house to live at discharge.  CSW provided active listening and validated pt's concerns.   CSW Dept has completed ROI and follow up with [employer].  Pt has been living independently prior to being admitted to [facility]  Employment status:    Insurance information:  Self Pay (Medicaid Pending) PT Recommendations:  Not assessed at this time Information / Referral to community resources:  Outpatient Psychiatric Care (Comment Required)  Patient/Family's Response to care:  Patient alert and oriented.  Patient agreeable to plan.  Pt's father supportive and strongly involved in pt.'s care.  Pt pleasant and appreciated CSW intervention.    Patient/Family's Understanding of and Emotional Response to Diagnosis, Current Treatment, and Prognosis:  Pt understands current  prognosis and treatment.  Emotional Assessment Appearance:  Appears stated age Attitude/Demeanor/Rapport:    Affect (typically observed):  Accepting, Calm, Pleasant, Adaptable Orientation:  Oriented to Self, Oriented to Place, Oriented to  Time, Oriented to Situation Alcohol / Substance use:    Psych involvement (Current and /or in the community):     Discharge Needs  Concerns to be addressed:  Coping/Stress Concerns Readmission within the last 30 days:  No Current discharge risk:  None Barriers to Discharge:  No Barriers Identified   Claudine Mouton, LCSWA 09/19/2016, 12:50 PM

## 2016-09-19 NOTE — BHH Group Notes (Signed)
B[facility] Group Notes:  (Nursing/MHT/Case Management/Adjunct)  Date:  09/19/2016  Time:  12:46 AM  Type of Therapy:  Evening Wrap-up Group  Participation Level:  Active  Participation Quality:  Appropriate and Attentive  Affect:  Appropriate  Cognitive:  Alert and Appropriate  Insight:  Appropriate, Good and Improving  Engagement in Group:  Developing/Improving and Engaged  Modes of Intervention:  Activity  Summary of Progress/Problems:  Christy MorrowChelsea Warner [name] 09/19/2016, 12:46 AM

## 2016-09-19 NOTE — Progress Notes (Signed)
  [facility] :  Will you be returning to the same living situation after discharge:  No. Will live w mother At discharge, do you have transportation home?: Yes,  mother Do you have the ability to pay for your medications: No. REferred to provider who can assist  Release of information consent forms completed and in the chart;  Patient's signature needed at discharge.  Patient to Follow up at: Follow-up Information    [facility], [facility]. Go on 09/22/2016.   Why:  [facility] for your initial hospital discharge appointment on Tuesday, June 12th between 9AM-2PM. Please bring your discharge paperwork to this appointment. Contact information: [address] [place] [address] [phone]           Next level of care provider has access to [facility] Link:no  Safety Planning and Suicide Prevention discussed: Yes,  reviewed w mother and patient, brochure provided  Have you used any form of tobacco in the last 30 days? (Cigarettes, Smokeless Tobacco, Cigars, and/or Pipes): Yes  Has patient been referred to the Quitline?: Patient refused referral  Patient has been referred for addiction treatment: Yes  [name] 09/19/2016, 12:13 PM
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
## 2016-09-19 NOTE — Progress Notes (Signed)
Cooperative with treatment, she spent most of the evening in the dayroom with peers, she had no complaints on shift or no behavioral issues. She appears to be resting in bed at this time.
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
## 2017-01-23 ENCOUNTER — Emergency Department: Payer: Medicaid Other

## 2017-01-23 ENCOUNTER — Encounter: Payer: Self-pay | Admitting: Emergency Medicine

## 2017-01-23 ENCOUNTER — Emergency Department
Admission: EM | Admit: 2017-01-23 | Discharge: 2017-01-23 | Disposition: A | Discharge status: Home / Self Care | Payer: Medicaid Other | Attending: Emergency Medicine | Admitting: Emergency Medicine

## 2017-01-23 DIAGNOSIS — O418X10 Other specified disorders of amniotic fluid and membranes, first trimester, not applicable or unspecified: Secondary | ICD-10-CM | POA: Diagnosis not present

## 2017-01-23 DIAGNOSIS — O99331 Smoking (tobacco) complicating pregnancy, first trimester: Secondary | ICD-10-CM | POA: Diagnosis not present

## 2017-01-23 DIAGNOSIS — O209 Hemorrhage in early pregnancy, unspecified: Secondary | ICD-10-CM | POA: Diagnosis present

## 2017-01-23 DIAGNOSIS — O200 Threatened abortion: Secondary | ICD-10-CM | POA: Diagnosis not present

## 2017-01-23 DIAGNOSIS — Z3A01 Less than 8 weeks gestation of pregnancy: Secondary | ICD-10-CM | POA: Insufficient documentation

## 2017-01-23 DIAGNOSIS — F17210 Nicotine dependence, cigarettes, uncomplicated: Secondary | ICD-10-CM | POA: Diagnosis not present

## 2017-01-23 DIAGNOSIS — O468X1 Other antepartum hemorrhage, first trimester: Secondary | ICD-10-CM

## 2017-01-23 LAB — HCG, QUANTITATIVE, PREGNANCY: hCG, Beta Chain, Quant, S: 46850 m[IU]/mL — ABNORMAL HIGH (ref <5)

## 2017-01-23 LAB — ABO/RH: ABO/RH(D): AB POS

## 2017-01-23 NOTE — ED Triage Notes (Signed)
States saw health department couple weeks ago and was told at that time she was about [redacted] weeks pregnant. Bloody vaginal spotting started today.

## 2017-01-23 NOTE — ED Notes (Signed)
ED Provider at bedside. 

## 2017-01-23 NOTE — ED Provider Notes (Signed)
[facility] Emergency Department Provider Note  Time seen: 4:04 PM  I have reviewed the triage vital signs and the nursing notes.   HISTORY  Chief Complaint Vaginal Bleeding    HPI [name] is a 21 y.o. female with a past medical history of depression, G1 P0 presents to the emergency department with vaginal bleeding according to the patient she took a pregnancy test one week ago and was seen at the health department with a repeat of the urine pregnancy test which was positive. She states today she had vaginal bleeding/spotting, which concerned her so she came to the emergency department. Denies any abdominal pain.  History reviewed. No pertinent past medical history.  Patient Active Problem List   Diagnosis Date Noted  . MDD (major depressive disorder), single episode, moderate (HCC) 09/18/2016  . Self-inflicted laceration of wrist 09/17/2016  . Tobacco use disorder 09/17/2016  . Cannabis use disorder, moderate, dependence (HCC) 09/17/2016    Past Surgical History:  Procedure Laterality Date  . ORTHOPEDIC SURGERY Right arm    Prior to Admission medications   Medication Sig Start Date End Date Taking? Authorizing Provider  metroNIDAZOLE (FLAGYL) 500 MG tablet Take 1 tablet (500 mg total) by mouth every 12 (twelve) hours. 09/19/16   Pucilowska, Ellin Goodie, MD    No Known Allergies  No family history on file.  Social History Social History  Substance Use Topics  . Smoking status: Current Every Day Smoker    Packs/day: 0.10    Types: Cigarettes  . Smokeless tobacco: Never Used  . Alcohol use Yes    Review of Systems Constitutional: Negative for fever. Cardiovascular: Negative for chest pain. Respiratory: Negative for shortness of breath. Gastrointestinal: Negative for abdominal pain, vomiting.  Genitourinary: Negative for dysuria.Positive for vaginal bleeding. Neurological: Negative for headache All other ROS  negative  ____________________________________________   PHYSICAL EXAM:  VITAL SIGNS: ED Triage Vitals [01/23/17 1233]  Enc Vitals Group     BP 124/81     Pulse Rate 73     Resp 20     Temp 98.3 F (36.8 C)     Temp Source Oral     SpO2 100 %     Weight 94 lb (42.6 kg)     Height  (1.626 m)     Head Circumference      Peak Flow      Pain Score      Pain Loc      Pain Edu?      Excl. in GC?     Constitutional: Alert and oriented. Well appearing and in no distress. Eyes: Normal exam ENT   Head: Normocephalic and atraumatic.   Mouth/Throat: Mucous membranes are moist. Cardiovascular: Normal rate, regular rhythm. No murmur Respiratory: Normal respiratory effort without tachypnea nor retractions. Breath sounds are clear Gastrointestinal: Soft and nontender. No distention.  Musculoskeletal: Nontender with normal range of motion in all extremities.  Neurologic:  Normal speech and language. No gross focal neurologic deficits Skin:  Skin is warm, dry and intact.  Psychiatric: Mood and affect are normal.   ____________________________________________   RADIOLOGY  IMPRESSION: Single live intrauterine gestation of 7 weeks 1 day. Moderate size subchorionic hemorrhage is noted as described above.  ____________________________________________   INITIAL IMPRESSION / ASSESSMENT AND PLAN / ED COURSE  Pertinent labs & imaging results that were available during my care of the patient were reviewed by me and considered in my medical decision making (see chart for details).  Patient presents to the emergency department for vaginal bleeding, states her last period was the end of August. Differential this time would include menstrual bleeding, ectopic pregnancy, miscarriage, threatened miscarriage. Patient's beta-hCG is reassuring, she is AB+. Ultrasound shows a 7 week 1 day live intrauterine pregnancy with a normal heart rate.  Ultrasound does show a moderate size  subchorionic hemorrhage. Likely the cause of patient's bleed. Highly suspect this to be the cause of her bleeding. We will discharge with OB care. I discussed return precautions. Patient agreeable to plan.  ____________________________________________   FINAL CLINICAL IMPRESSION(S) / ED DIAGNOSES  Threatened miscarriage Subchorionic hemorrhage    Minna Antis, MD 01/23/17 1616

## 2017-01-23 NOTE — Discharge Instructions (Signed)
Please follow-up with a doctor or the health department in the next 2-3 weeks for recheck/reevaluation. Return to the emergency department for any significant increase in bleeding, development of abdominal pain, or any other symptom personally concerning to yourself.

## 2017-01-31 ENCOUNTER — Emergency Department
Admission: EM | Admit: 2017-01-31 | Discharge: 2017-01-31 | Disposition: A | Discharge status: Home / Self Care | Payer: Medicaid Other | Attending: Emergency Medicine | Admitting: Emergency Medicine

## 2017-01-31 ENCOUNTER — Encounter: Payer: Self-pay | Admitting: Intensive Care

## 2017-01-31 ENCOUNTER — Emergency Department: Payer: Medicaid Other

## 2017-01-31 DIAGNOSIS — F17210 Nicotine dependence, cigarettes, uncomplicated: Secondary | ICD-10-CM | POA: Insufficient documentation

## 2017-01-31 DIAGNOSIS — O2341 Unspecified infection of urinary tract in pregnancy, first trimester: Secondary | ICD-10-CM | POA: Diagnosis not present

## 2017-01-31 DIAGNOSIS — Z3A09 9 weeks gestation of pregnancy: Secondary | ICD-10-CM | POA: Insufficient documentation

## 2017-01-31 DIAGNOSIS — E876 Hypokalemia: Secondary | ICD-10-CM | POA: Diagnosis not present

## 2017-01-31 DIAGNOSIS — O99111 Other diseases of the blood and blood-forming organs and certain disorders involving the immune mechanism complicating pregnancy, first trimester: Secondary | ICD-10-CM | POA: Insufficient documentation

## 2017-01-31 DIAGNOSIS — R8271 Bacteriuria: Secondary | ICD-10-CM

## 2017-01-31 DIAGNOSIS — O208 Other hemorrhage in early pregnancy: Secondary | ICD-10-CM | POA: Insufficient documentation

## 2017-01-31 DIAGNOSIS — O210 Mild hyperemesis gravidarum: Secondary | ICD-10-CM | POA: Insufficient documentation

## 2017-01-31 DIAGNOSIS — O99331 Smoking (tobacco) complicating pregnancy, first trimester: Secondary | ICD-10-CM | POA: Diagnosis not present

## 2017-01-31 DIAGNOSIS — O209 Hemorrhage in early pregnancy, unspecified: Secondary | ICD-10-CM

## 2017-01-31 DIAGNOSIS — O219 Vomiting of pregnancy, unspecified: Secondary | ICD-10-CM | POA: Diagnosis present

## 2017-01-31 LAB — COMPREHENSIVE METABOLIC PANEL
ALK PHOS: 44 U/L (ref 38–126)
ALT: 10 U/L — AB (ref 14–54)
AST: 18 U/L (ref 15–41)
Albumin: 4.2 g/dL (ref 3.5–5.0)
Anion gap: 5 (ref 5–15)
BILIRUBIN TOTAL: 0.4 mg/dL (ref 0.3–1.2)
BUN: 10 mg/dL (ref 6–20)
CALCIUM: 9.2 mg/dL (ref 8.9–10.3)
CHLORIDE: 105 mmol/L (ref 101–111)
CO2: 25 mmol/L (ref 22–32)
CREATININE: 0.46 mg/dL (ref 0.44–1.00)
Glucose, Bld: 85 mg/dL (ref 65–99)
Potassium: 3.3 mmol/L — ABNORMAL LOW (ref 3.5–5.1)
Sodium: 135 mmol/L (ref 135–145)
TOTAL PROTEIN: 7.3 g/dL (ref 6.5–8.1)

## 2017-01-31 LAB — URINALYSIS, COMPLETE (UACMP) WITH MICROSCOPIC
Bilirubin Urine: NEGATIVE
GLUCOSE, UA: NEGATIVE mg/dL
Ketones, ur: NEGATIVE mg/dL
Leukocytes, UA: NEGATIVE
NITRITE: NEGATIVE
PH: 7 (ref 5.0–8.0)
Protein, ur: NEGATIVE mg/dL
SPECIFIC GRAVITY, URINE: 1.016 (ref 1.005–1.030)

## 2017-01-31 LAB — CBC
HEMATOCRIT: 36.2 % (ref 35.0–47.0)
Hemoglobin: 12.6 g/dL (ref 12.0–16.0)
MCH: 33.6 pg (ref 26.0–34.0)
MCHC: 34.8 g/dL (ref 32.0–36.0)
MCV: 96.8 fL (ref 80.0–100.0)
PLATELETS: 194 10*3/uL (ref 150–440)
RBC: 3.75 MIL/uL — AB (ref 3.80–5.20)
RDW: 12.3 % (ref 11.5–14.5)
WBC: 8.7 10*3/uL (ref 3.6–11.0)

## 2017-01-31 LAB — HCG, QUANTITATIVE, PREGNANCY: hCG, Beta Chain, Quant, S: 81617 m[IU]/mL — ABNORMAL HIGH (ref <5)

## 2017-01-31 MED ORDER — SODIUM CHLORIDE 0.9 % IV BOLUS (SEPSIS)
1000.0000 mL | Freq: Once | INTRAVENOUS | Status: AC
  Administered 2017-01-31: 1000 mL via INTRAVENOUS

## 2017-01-31 MED ORDER — VITAMIN B-6 50 MG PO TABS: 50.0000 mg | ORAL_TABLET | Freq: Three times a day (TID) | ORAL | 0 refills | Status: DC | PRN

## 2017-01-31 MED ORDER — ONDANSETRON HCL 4 MG/2ML IJ SOLN
INTRAMUSCULAR | Status: AC
  Administered 2017-01-31: 4 mg via INTRAVENOUS
  Filled 2017-01-31: qty 2

## 2017-01-31 MED ORDER — NITROFURANTOIN MONOHYD MACRO 100 MG PO CAPS
100.0000 mg | ORAL_CAPSULE | Freq: Once | ORAL | Status: AC
  Administered 2017-01-31: 100 mg via ORAL
  Filled 2017-01-31: qty 1

## 2017-01-31 MED ORDER — POTASSIUM CHLORIDE CRYS ER 20 MEQ PO TBCR
40.0000 meq | EXTENDED_RELEASE_TABLET | Freq: Once | ORAL | Status: AC
  Administered 2017-01-31: 40 meq via ORAL
  Filled 2017-01-31: qty 2

## 2017-01-31 MED ORDER — NITROFURANTOIN MONOHYD MACRO 100 MG PO CAPS: 100.0000 mg | ORAL_CAPSULE | Freq: Two times a day (BID) | ORAL | 0 refills | Status: AC

## 2017-01-31 MED ORDER — ONDANSETRON HCL 4 MG/2ML IJ SOLN
4.0000 mg | Freq: Once | INTRAMUSCULAR | Status: AC
  Administered 2017-01-31: 4 mg via INTRAVENOUS

## 2017-01-31 MED ORDER — ONDANSETRON 4 MG PO TBDP: 4.0000 mg | ORAL_TABLET | Freq: Three times a day (TID) | ORAL | 0 refills | Status: DC | PRN

## 2017-01-31 NOTE — Discharge Instructions (Signed)
Please drink plenty of fluids stay well-hydrated. Please try to take something by mouth, even if it is a full liquid diet, prior to going to work. You may pyridoxine or Zofran for nausea.  Return to the emergency department if you develop severe pain, lightheadedness or fainting, shortness of breath, increasing bleeding, abdominal pain, inability to keep down fluids, fever, or any other symptoms concerning to you

## 2017-01-31 NOTE — ED Notes (Signed)
Patient given crackers and ginger ale. Patient states she feels hungry.

## 2017-01-31 NOTE — ED Triage Notes (Signed)
Patient presents today with c/o nausea. Currently reports being [redacted] weeks pregnant and states "I need fluids because I am dehydrated" Patient was seen here X1 week ago for bleeding and reports having US and was told everything is normal and not to be alarmed by minimal bleeding unless having abdominal pain. Denies abdominal pain today. Patient states she has had some bleeding today using 1 pad but is here for nausea

## 2017-01-31 NOTE — ED Provider Notes (Addendum)
[facility] Emergency Department Provider Note  ____________________________________________  Time seen: Approximately 6:40 PM  I have reviewed the triage vital signs and the nursing notes.   HISTORY  Chief Complaint Emesis During Pregnancy    HPI [name] is a 21 y.o. female G1 P08 weeks pregnant presenting with nausea, vomiting, and ongoing vaginal bleeding. The patient reports that every day she is having vomiting, worsen the morning. It is exacerbated by not eating breakfast and having to go to work early. She has not tried anything for her nausea. She is also continuing to have intermittent vaginal bleeding "like a period" without any abdominal cramping or pain. No lightheadedness or syncope, no fevers or chills. She was seen here 01/23/17 for vaginal bleeding and was found to have a live IUP with evidence of a subchorionic hemorrhage.  She has not yet established prenatal care but has been taking prenatal vitamins.   History reviewed. No pertinent past medical history.  Patient Active Problem List   Diagnosis Date Noted  . MDD (major depressive disorder), single episode, moderate (HCC) 09/18/2016  . Self-inflicted laceration of wrist 09/17/2016  . Tobacco use disorder 09/17/2016  . Cannabis use disorder, moderate, dependence (HCC) 09/17/2016    Past Surgical History:  Procedure Laterality Date  . ORTHOPEDIC SURGERY Right arm    Current Outpatient Rx  . Order #: [id number] Class: Print  . Order #: [id number] Class: Print  . Order #: [id number] Class: Print  . Order #: [id number] Class: Print    Allergies Patient has no known allergies.  History reviewed. No pertinent family history.  Social History Social History  Substance Use Topics  . Smoking status: Current Every Day Smoker    Packs/day: 0.10    Types: Cigarettes  . Smokeless tobacco: Never Used  . Alcohol use Yes    Review of Systems Constitutional: No fever/chills. No  lightheadedness or syncope. Eyes: No visual changes. ENT: No sore throat. No congestion or rhinorrhea. Cardiovascular: Denies chest pain. Denies palpitations. Respiratory: Denies shortness of breath.  No cough. Gastrointestinal: No abdominal pain.  Positive nausea, positive vomiting.  No diarrhea.  No constipation. Genitourinary: Negative for dysuria. Positive for ongoing unchanged vaginal bleeding.  No change in vaginal discharge. Musculoskeletal: Negative for back pain. Skin: Negative for rash. Neurological: Negative for headaches. No focal numbness, tingling or weakness.     ____________________________________________   PHYSICAL EXAM:  VITAL SIGNS: ED Triage Vitals  Enc Vitals Group     BP 01/31/17 1653 127/68     Pulse Rate 01/31/17 1653 84     Resp 01/31/17 1653 14     Temp 01/31/17 1653 98.4 F (36.9 C)     Temp Source 01/31/17 1653 Oral     SpO2 01/31/17 1653 100 %     Weight 01/31/17 1654 94 lb (42.6 kg)     Height 01/31/17 1654 5\' 4"  (1.626 m)     Head Circumference --      Peak Flow --      Pain Score 01/31/17 1815 0     Pain Loc --      Pain Edu? --      Excl. in GC? --     Constitutional: Alert and oriented. Well appearing and in no acute distress. Answers questions appropriately. Eyes: Conjunctivae are normal.  EOMI. No scleral icterus. Head: Atraumatic. Nose: No congestion/rhinnorhea. Mouth/Throat: Mucous membranes are moist.  Neck: No stridor.  Supple.   Cardiovascular: Normal rate, regular rhythm. No murmurs, rubs or gallops.  Respiratory: Normal respiratory effort.  No accessory muscle use or retractions. Lungs CTAB.  No wheezes, rales or ronchi. Gastrointestinal: Soft, nontender and nondistended.  No guarding or rebound.  No peritoneal signs. Musculoskeletal: No LE edema.  Neurologic:  A&Ox3.  Speech is clear.  Face and smile are symmetric.  EOMI.  Moves all extremities well. Skin:  Skin is warm, dry and intact. No rash noted. Psychiatric: Mood and  affect are normal. Speech and behavior are normal.  Normal judgement.  ____________________________________________   LABS (all labs ordered are listed, but only abnormal results are displayed)  Labs Reviewed  CBC - Abnormal; Notable for the following:       Result Value   RBC 3.75 (*)    All other components within normal limits  COMPREHENSIVE METABOLIC PANEL - Abnormal; Notable for the following:    Potassium 3.3 (*)    ALT 10 (*)    All other components within normal limits  URINALYSIS, COMPLETE (UACMP) WITH MICROSCOPIC - Abnormal; Notable for the following:    Color, Urine YELLOW (*)    APPearance CLEAR (*)    Hgb urine dipstick SMALL (*)    Bacteria, UA RARE (*)    Squamous Epithelial / LPF 0-5 (*)    All other components within normal limits  HCG, QUANTITATIVE, PREGNANCY - Abnormal; Notable for the following:    hCG, Beta Chain, Quant, S 81,617 (*)    All other components within normal limits   ____________________________________________  EKG  Not indicated ____________________________________________  RADIOLOGY  No results found.  ____________________________________________   PROCEDURES  Procedure(s) performed: None  Procedures  Critical Care performed: No ____________________________________________   INITIAL IMPRESSION / ASSESSMENT AND PLAN / ED COURSE  Pertinent labs & imaging results that were available during my care of the patient were reviewed by me and considered in my medical decision making (see chart for details).  21 y.o. female G1 P0 proximal [redacted] weeks pregnant presenting with nausea and vomiting, ongoing vaginal bleeding in the setting of his known subchorionic hemorrhage. Overall, the patient is medically stable. She does not appear clinically significantly dehydrated. We will plan to give her intravenous fluids and an antiemetic, and make sure she is able to keep down liquids. I have already spoken to her about using pyridoxine and  Zofran for her nausea home, as well as eating small regular meals to prevent nausea area. Given her ongoing bleeding, I'll plan to get an ultrasound for reevaluation of her fetus and subchorionic hemorrhage. The patient is Rh+ on her at her last exam so Rhogam as not indicated. Plan reevaluation for final disposition.  ----------------------------------------- 9:21 PM on 01/31/2017 -----------------------------------------  The patient's workup has been reassuring. Her white blood cell count is normal. Her potassium is 3.3 and has been supplemented. Her hCG has increased. She has bacteriuria without other significant findings for UTI but given her pregnancy state, we'll treat her with Macrobid and she will receive the first dose here. At this time, the patient is feeling significantly better and has been able to tolerate food and liquid without vomiting. I'm awaiting the results of her pelvic ultrasound and if it is reassuring, the patient will be discharged home with symptomatic treatment.  I have reviewed the patient's medical chart. ____________________________________________  FINAL CLINICAL IMPRESSION(S) / ED DIAGNOSES  Final diagnoses:  Hyperemesis gravidarum  Vaginal bleeding in pregnancy, first trimester  Hypokalemia  Bacteriuria         NEW MEDICATIONS STARTED DURING THIS VISIT:  New  Prescriptions   NITROFURANTOIN, MACROCRYSTAL-MONOHYDRATE, (MACROBID) 100 MG CAPSULE    Take 1 capsule (100 mg total) by mouth 2 (two) times daily.   ONDANSETRON (ZOFRAN ODT) 4 MG DISINTEGRATING TABLET    Take 1 tablet (4 mg total) by mouth every 8 (eight) hours as needed for nausea or vomiting.   PYRIDOXINE (VITAMIN B-6) 50 MG TABLET    Take 1 tablet (50 mg total) by mouth every 8 (eight) hours as needed. For nausea or vomiting      Rockne Menghini, MD 01/31/17 1845    Rockne Menghini, MD 01/31/17 2122    Rockne Menghini, MD 01/31/17 2124

## 2017-01-31 NOTE — ED Notes (Signed)
Patient transported to Ultrasound 

## 2017-02-07 ENCOUNTER — Emergency Department
Admission: EM | Admit: 2017-02-07 | Discharge: 2017-02-07 | Disposition: A | Discharge status: Home / Self Care | Payer: Medicaid Other | Attending: Emergency Medicine | Admitting: Emergency Medicine

## 2017-02-07 DIAGNOSIS — Z3A09 9 weeks gestation of pregnancy: Secondary | ICD-10-CM | POA: Insufficient documentation

## 2017-02-07 DIAGNOSIS — O200 Threatened abortion: Secondary | ICD-10-CM | POA: Insufficient documentation

## 2017-02-07 DIAGNOSIS — Z79899 Other long term (current) drug therapy: Secondary | ICD-10-CM | POA: Insufficient documentation

## 2017-02-07 DIAGNOSIS — O209 Hemorrhage in early pregnancy, unspecified: Secondary | ICD-10-CM | POA: Diagnosis not present

## 2017-02-07 DIAGNOSIS — F17210 Nicotine dependence, cigarettes, uncomplicated: Secondary | ICD-10-CM | POA: Diagnosis not present

## 2017-02-07 DIAGNOSIS — O99331 Smoking (tobacco) complicating pregnancy, first trimester: Secondary | ICD-10-CM | POA: Insufficient documentation

## 2017-02-07 DIAGNOSIS — Z3491 Encounter for supervision of normal pregnancy, unspecified, first trimester: Secondary | ICD-10-CM

## 2017-02-07 DIAGNOSIS — R102 Pelvic and perineal pain: Secondary | ICD-10-CM

## 2017-02-07 LAB — URINALYSIS, COMPLETE (UACMP) WITH MICROSCOPIC
BACTERIA UA: NONE SEEN
Bilirubin Urine: NEGATIVE
Glucose, UA: NEGATIVE mg/dL
Ketones, ur: NEGATIVE mg/dL
Leukocytes, UA: NEGATIVE
NITRITE: NEGATIVE
PROTEIN: NEGATIVE mg/dL
RBC / HPF: NONE SEEN RBC/hpf (ref 0–5)
Specific Gravity, Urine: 1.018 (ref 1.005–1.030)
WBC UA: NONE SEEN WBC/hpf (ref 0–5)
pH: 7 (ref 5.0–8.0)

## 2017-02-07 LAB — COMPREHENSIVE METABOLIC PANEL
ALBUMIN: 4 g/dL (ref 3.5–5.0)
ALT: 10 U/L — ABNORMAL LOW (ref 14–54)
ANION GAP: 6 (ref 5–15)
AST: 16 U/L (ref 15–41)
Alkaline Phosphatase: 46 U/L (ref 38–126)
BUN: 12 mg/dL (ref 6–20)
CHLORIDE: 104 mmol/L (ref 101–111)
CO2: 25 mmol/L (ref 22–32)
Calcium: 9.3 mg/dL (ref 8.9–10.3)
Creatinine, Ser: 0.48 mg/dL (ref 0.44–1.00)
GFR calc Af Amer: >60 mL/min (ref >60)
GFR calc non Af Amer: >60 mL/min (ref >60)
GLUCOSE: 72 mg/dL (ref 65–99)
POTASSIUM: 3.7 mmol/L (ref 3.5–5.1)
SODIUM: 135 mmol/L (ref 135–145)
Total Bilirubin: 0.5 mg/dL (ref 0.3–1.2)
Total Protein: 7 g/dL (ref 6.5–8.1)

## 2017-02-07 LAB — CBC
HEMATOCRIT: 34.6 % — AB (ref 35.0–47.0)
HEMOGLOBIN: 12.1 g/dL (ref 12.0–16.0)
MCH: 33.8 pg (ref 26.0–34.0)
MCHC: 34.9 g/dL (ref 32.0–36.0)
MCV: 96.9 fL (ref 80.0–100.0)
Platelets: 200 10*3/uL (ref 150–440)
RBC: 3.57 MIL/uL — ABNORMAL LOW (ref 3.80–5.20)
RDW: 12.8 % (ref 11.5–14.5)
WBC: 9.3 10*3/uL (ref 3.6–11.0)

## 2017-02-07 LAB — LIPASE, BLOOD: Lipase: 33 U/L (ref 11–51)

## 2017-02-07 LAB — HCG, QUANTITATIVE, PREGNANCY: HCG, BETA CHAIN, QUANT, S: 90662 m[IU]/mL — AB (ref <5)

## 2017-02-07 NOTE — ED Triage Notes (Signed)
Patient reports having sharp abdominal pain and is concerned because she is pregnant.  Patient denies vaginal bleeding.

## 2017-02-07 NOTE — ED Notes (Signed)
Pt states that she was seen last week and diagnosed with a subchorionic hemorrhage pt states that she cont to have bleeding daily or every other day. Pt states that the abd discomfort makes her nervous because this is her first pregnancy. No distress noted

## 2017-02-07 NOTE — ED Provider Notes (Signed)
[facility] Emergency Department Provider Note  ____________________________________________  Time seen: Approximately 9:22 PM  I have reviewed the triage vital signs and the nursing notes.   HISTORY  Chief Complaint Abdominal Pain    HPI [name] is a 21 y.o. female who is [redacted] weeks pregnant who complains of sharp pelvic pain that started today. Brief and intermittent, nonradiating, no aggravating or alleviating factors. No dysuria frequency urgency. She has had vaginal bleeding for the past 2 or 3 weeks, had 2 ultrasounds. Review of electronic medical record and ultrasound results showed that she has confirmed IUP. She is a G1 P0. She [facility] appointment [facility] tomorrow.     No past medical history on file.   Patient Active Problem List   Diagnosis Date Noted  . MDD (major depressive disorder), single episode, moderate (HCC) 09/18/2016  . Self-inflicted laceration of wrist 09/17/2016  . Tobacco use disorder 09/17/2016  . Cannabis use disorder, moderate, dependence (HCC) 09/17/2016     Past Surgical History:  Procedure Laterality Date  . ORTHOPEDIC SURGERY Right arm     Prior to Admission medications   Medication Sig Start Date End Date Taking? Authorizing Provider  metroNIDAZOLE (FLAGYL) 500 MG tablet Take 1 tablet (500 mg total) by mouth every 12 (twelve) hours. 09/19/16   Pucilowska, Jolanta B, MD  nitrofurantoin, macrocrystal-monohydrate, (MACROBID) 100 MG capsule Take 1 capsule (100 mg total) by mouth 2 (two) times daily. 01/31/17 02/07/17  Rockne Menghini, MD  ondansetron (ZOFRAN ODT) 4 MG disintegrating tablet Take 1 tablet (4 mg total) by mouth every 8 (eight) hours as needed for nausea or vomiting. 01/31/17   Rockne Menghini, MD  pyridOXINE (VITAMIN B-6) 50 MG tablet Take 1 tablet (50 mg total) by mouth every 8 (eight) hours as needed. For nausea or vomiting 01/31/17   Rockne Menghini, MD      Allergies Patient has no known allergies.   No family history on file.  Social History Social History  Substance Use Topics  . Smoking status: Current Every Day Smoker    Packs/day: 0.10    Types: Cigarettes  . Smokeless tobacco: Never Used  . Alcohol use Yes    Review of Systems  Constitutional:   No fever or chills.  ENT:   No sore throat. No rhinorrhea. Cardiovascular:   No chest pain or syncope. Respiratory:   No dyspnea or cough. Gastrointestinal:   Positive as above for abdominal pain without vomiting and diarrhea.  Musculoskeletal:   Negative for focal pain or swelling All other systems reviewed and are negative except as documented above in ROS and HPI.  ____________________________________________   PHYSICAL EXAM:  VITAL SIGNS: ED Triage Vitals  Enc Vitals Group     BP 02/07/17 2003 112/61     Pulse Rate 02/07/17 2003 89     Resp 02/07/17 2003 18     Temp 02/07/17 2003 98.6 F (37 C)     Temp Source 02/07/17 2003 Oral     SpO2 02/07/17 2003 100 %     Weight --      Height --      Head Circumference --      Peak Flow --      Pain Score 02/07/17 2001 4     Pain Loc --      Pain Edu? --      Excl. in GC? --     Vital signs reviewed, nursing assessments reviewed.   Constitutional:  Alert and oriented. Well appearing and in no distress. Eyes:   No scleral icterus.  EOMI. No nystagmus. No conjunctival pallor. PERRL. ENT   Head:   Normocephalic and atraumatic.   Nose:   No congestion/rhinnorhea.    Mouth/Throat:   MMM, no pharyngeal erythema. No peritonsillar mass.    Neck:   No meningismus. Full ROM. Hematological/Lymphatic/Immunilogical:   No cervical lymphadenopathy. Cardiovascular:   RRR. Symmetric bilateral radial and DP pulses.  No murmurs.  Respiratory:   Normal respiratory effort without tachypnea/retractions. Breath sounds are clear and equal bilaterally. No wheezes/rales/rhonchi. Gastrointestinal:   Soft and nontender.  Non distended. There is no CVA tenderness.  No rebound, rigidity, or guarding. Bedside point of care ultrasound performed by me shows IUP, positive cardiac activity Genitourinary:   deferred Musculoskeletal:   Normal range of motion in all extremities. No joint effusions.  No lower extremity tenderness.  No edema. Neurologic:   Normal speech and language.  Motor grossly intact. No gross focal neurologic deficits are appreciated.  Skin:    Skin is warm, dry and intact. No rash noted.  No petechiae, purpura, or bullae.  ____________________________________________    LABS (pertinent positives/negatives) (all labs ordered are listed, but only abnormal results are displayed) Labs Reviewed  COMPREHENSIVE METABOLIC PANEL - Abnormal; Notable for the following:       Result Value   ALT 10 (*)    All other components within normal limits  CBC - Abnormal; Notable for the following:    RBC 3.57 (*)    HCT 34.6 (*)    All other components within normal limits  URINALYSIS, COMPLETE (UACMP) WITH MICROSCOPIC - Abnormal; Notable for the following:    Color, Urine YELLOW (*)    APPearance TURBID (*)    Hgb urine dipstick MODERATE (*)    Squamous Epithelial / LPF 0-5 (*)    All other components within normal limits  HCG, QUANTITATIVE, PREGNANCY - Abnormal; Notable for the following:    hCG, Beta Chain, Quant, S [id number] (*)    All other components within normal limits  LIPASE, BLOOD   ____________________________________________   EKG    ____________________________________________    RADIOLOGY  No results found.  ____________________________________________   PROCEDURES Procedures  ____________________________________________   DIFFERENTIAL DIAGNOSIS  Spontaneous abortion, subchorionic hemorrhage, gestational pelvic pain, cystitis  CLINICAL IMPRESSION / ASSESSMENT AND PLAN / ED COURSE  Pertinent labs & imaging results that were available during my care of the patient were  reviewed by me and considered in my medical decision making (see chart for details).   Patient well-appearing no acute distress normal vital signs. Labs and urinalysis unremarkable. Sharene ButtersQuant has risen from 81,000 and 90,000 and the past week. Unclear if this is due to impending miscarriage versus already having reached peak and now downtrending. On bedside ultrasound and appears that she has a live IUP. Counseled her to continue [facility] follow-[facility] with [facility] and to continue monitoring her symptoms. Abdomen is otherwise benign on exam      ____________________________________________   FINAL CLINICAL IMPRESSION(S) / ED DIAGNOSES    Final diagnoses:  Pelvic pain in female  First trimester pregnancy  Threatened miscarriage      New Prescriptions   No medications on file     Portions of this note were generated with dragon dictation software. Dictation errors may occur despite best attempts at proofreading.    Sharman CheekStafford, Stefania Goulart, MD 02/07/17 2128

## 2017-02-08 ENCOUNTER — Ambulatory Visit: Payer: Self-pay | Admitting: Certified Nurse Midwife

## 2017-02-08 VITALS — BP 110/55 | HR 65 | Ht 64.0 in | Wt 95.9 lb

## 2017-02-08 DIAGNOSIS — Z3401 Encounter for supervision of normal first pregnancy, first trimester: Secondary | ICD-10-CM

## 2017-02-08 DIAGNOSIS — O468X1 Other antepartum hemorrhage, first trimester: Secondary | ICD-10-CM

## 2017-02-08 DIAGNOSIS — Z1389 Encounter for screening for other disorder: Secondary | ICD-10-CM

## 2017-02-08 DIAGNOSIS — Z113 Encounter for screening for infections with a predominantly sexual mode of transmission: Secondary | ICD-10-CM

## 2017-02-08 DIAGNOSIS — O418X10 Other specified disorders of amniotic fluid and membranes, first trimester, not applicable or unspecified: Secondary | ICD-10-CM

## 2017-02-08 NOTE — Patient Instructions (Signed)
Pregnancy and Zika Virus Disease Zika virus disease, or Zika, is an illness that can spread to people from mosquitoes that carry the virus. It may also spread from person to person through infected body fluids. Zika first occurred in [place], but recently it has spread to new areas. The virus occurs in tropical climates. The location of Zika continues to change. Most people who become infected with Zika virus do not develop serious illness. However, Zika may cause birth defects in an unborn baby whose mother is infected with the virus. It may also increase the risk of miscarriage. What are the symptoms of Zika virus disease? In many cases, people who have been infected with Zika virus do not develop any symptoms. If symptoms appear, they usually start about a week after the person is infected. Symptoms are usually mild. They may include:  Fever.  Rash.  Red eyes.  Joint pain.  How does Zika virus disease spread? The main way that Zika virus spreads is through the bite of a certain type of mosquito. Unlike most types of mosquitos, which bite only at night, the type of mosquito that carries Zika virus bites both at night and during the day. Zika virus can also spread through sexual contact, through a blood transfusion, and from a mother to her baby before or during birth. Once you have had Zika virus disease, it is unlikely that you will get it again. Can I pass Zika to my baby during pregnancy? Yes, Zika can pass from a mother to her baby before or during birth. What problems can Zika cause for my baby? A woman who is infected with Zika virus while pregnant is at risk of having her baby born with a condition in which the brain or head is smaller than expected (microcephaly). Babies who have microcephaly can have developmental delays, seizures, hearing problems, and vision problems. Having Zika virus disease during pregnancy can also increase the risk of miscarriage. How can Zika virus disease be  prevented? There is no vaccine to prevent Zika. The best way to prevent the disease is to avoid infected mosquitoes and avoid exposure to body fluids that can spread the virus. Avoid any possible exposure to Zika by taking the following precautions. For women and their sex partners:  Avoid traveling to high-risk areas. The locations where Zika is being reported change often. To identify high-risk areas, check the CDC travel website: www.cdc.gov/zika/geo/index.html  If you or your sex partner must travel to a high-risk area, talk with a health care provider before and after traveling.  Take all precautions to avoid mosquito bites if you live in, or travel to, any of the high-risk areas. Insect repellents are safe to use during pregnancy.  Ask your health care provider when it is safe to have sexual contact.  For women:  If you are pregnant or trying to become pregnant, avoid sexual contact with persons who may have been exposed to Zika virus, persons who have possible symptoms of Zika, or persons whose history you are unsure about. If you choose to have sexual contact with someone who may have been exposed to Zika virus, use condoms correctly during the entire duration of sexual activity, every time. Do not share sexual devices, as you may be exposed to body fluids.  Ask your health care provider about when it is safe to attempt pregnancy after a possible exposure to Zika virus.  What steps should I take to avoid mosquito bites? Take these steps to avoid mosquito bites   when you are in a high-risk area:  Wear loose clothing that covers your arms and legs.  Limit your outdoor activities.  Do not open windows unless they have window screens.  Sleep under mosquito nets.  Use insect repellent. The best insect repellents have:  DEET, picaridin, oil of lemon eucalyptus (OLE), or IR3535 in them.  Higher amounts of an active ingredient in them.  Remember that insect repellents are safe to  use during pregnancy.  Do not use OLE on children who are younger than 57 years of age. Do not use insect repellent on babies who are younger than 54 months of age.  Cover your child's stroller with mosquito netting. Make sure the netting fits snugly and that any loose netting does not cover your child's mouth or nose. Do not use a blanket as a mosquito-protection cover.  Do not apply insect repellent underneath clothing.  If you are using sunscreen, apply the sunscreen before applying the insect repellent.  Treat clothing with permethrin. Do not apply permethrin directly to your skin. Follow label directions for safe use.  Get rid of standing water, where mosquitoes may reproduce. Standing water is often found in items such as buckets, bowls, animal food dishes, and flowerpots.  When you return from traveling to any high-risk area, continue taking actions to protect yourself against mosquito bites for 3 weeks, even if you show no signs of illness. This will prevent spreading Zika virus to uninfected mosquitoes. What should I know about the sexual transmission of Zika? People can spread Zika to their sexual partners during vaginal, anal, or oral sex, or by sharing sexual devices. Many people with [place] do not develop symptoms, so a person could spread the disease without knowing that they are infected. The greatest risk is to women who are pregnant or who may become pregnant. Zika virus can live longer in semen than it can live in blood. Couples can prevent sexual transmission of the virus by:  Using condoms correctly during the entire duration of sexual activity, every time. This includes vaginal, anal, and oral sex.  Not sharing sexual devices. Sharing increases your risk of being exposed to body fluid from another person.  Avoiding all sexual activity until your health care provider says it is safe.  Should I be tested for Zika virus? A sample of your blood can be tested for Zika virus. A  pregnant woman should be tested if she may have been exposed to the virus or if she has symptoms of Zika. She may also have additional tests done during her pregnancy, such ultrasound testing. Talk with your health care provider about which tests are recommended. This information is not intended to replace advice given to you by your health care provider. Make sure you discuss any questions you have with your health care provider. Document Released: 12/19/2014 Document Revised: 09/05/2015 Document Reviewed: 12/12/2014 Elsevier Interactive Patient Education  2018 [employer]. Hydroxyprogesterone solution for injection What is this medicine? HYDROXYPROGESTERONE (hye drox ee proe JES ter one) is a female hormone. This medicine is used in women who are pregnant and who have delivered a baby too early (preterm) in the past. It helps lower the risk of having a preterm baby again. This medicine may be used for other purposes; ask your health care provider or pharmacist if you have questions. COMMON BRAND NAME(S): Makena What should I tell my health care provider before I take this medicine? They need to know if you have any of these conditions: -  blood clotting disorders -breast, cervical, uterine, or vaginal cancer -depression -diabetes or prediabetes -heart disease -high blood pressure -kidney disease -liver disease -lung or breathing disease, like asthma -migraine headaches -seizures -vaginal bleeding -an unusual or allergic reaction to hydroxyprogesterone, other hormones, medicines, foods, dyes, castor oil, benzyl alcohol, or other preservatives -breast-feeding How should I use this medicine? This medicine is for injection into a muscle. It is given by a health care professional in a hospital or clinic setting. You are likely to get an injection once a week to prevent preterm delivery. Talk to your pediatrician regarding the use of this medicine in children. Special care may be  needed. Overdosage: If you think you have taken too much of this medicine contact a poison control center or emergency room at once. NOTE: This medicine is only for you. Do not share this medicine with others. What if I miss a dose? It is important not to miss your dose. Call your doctor or health care professional if you are unable to keep an appointment. What may interact with this medicine? -acetaminophen -bupropion -clozapine -efavirenz -halothane -methadone -nicotine -theophylline, aminophylline -tizanidine This list may not describe all possible interactions. Give your health care provider a list of all the medicines, herbs, non-prescription drugs, or dietary supplements you use. Also tell them if you smoke, drink alcohol, or use illegal drugs. Some items may interact with your medicine. What should I watch for while using this medicine? Your condition will be monitored carefully while you are receiving this medicine. What side effects may I notice from receiving this medicine? Side effects that you should report to your doctor or health care professional as soon as possible: -allergic reactions like skin rash, itching or hives, swelling of the face, lips, or tongue -breathing problems -breast tissue changes or discharge -changes in vision -confusion, trouble speaking or understanding -depressed mood -increased hunger or thirst -increased urination -pain, redness, or irritation at site where injected -pain, swelling, warmth in the leg -shortness of breath, chest pain, swelling in a leg -sudden numbness or weakness of the face, arm or leg -sudden severe headaches -trouble walking, dizziness, loss of balance or coordination -unusually weak or tired -vaginal bleeding -yellowing of the eyes or skin Side effects that usually do not require medical attention (report to your doctor or health care professional if they continue or are bothersome): -changes in emotions or  moods -diarrhea -fluid retention and swelling -nausea This list may not describe all possible side effects. Call your doctor for medical advice about side effects. You may report side effects to FDA at 1-800-FDA-1088. Where should I keep my medicine? This drug is given in a hospital or clinic and will not be stored at home. NOTE: This sheet is a summary. It may not cover all possible information. If you have questions about this medicine, talk to your doctor, pharmacist, or health care provider.  2018 Elsevier/Gold Standard (2009-05-21 11:17:12) First Trimester of Pregnancy The first trimester of pregnancy is from week 1 until the end of week 13 (months 1 through 3). During this time, your baby will begin to develop inside you. At 6-8 weeks, the eyes and face are formed, and the heartbeat can be seen on ultrasound. At the end of 12 weeks, all the baby's organs are formed. Prenatal care is all the medical care you receive before the birth of your baby. Make sure you get good prenatal care and follow all of your doctor's instructions. Follow these instructions at home: Medicines  Take over-the-counter and prescription medicines only as told by your doctor. Some medicines are safe and some medicines are not safe during pregnancy.  Take a prenatal vitamin that contains at least 600 micrograms (mcg) of folic acid.  If you have trouble pooping (constipation), take medicine that will make your stool soft (stool softener) if your doctor approves. Eating and drinking  Eat regular, healthy meals.  Your doctor will tell you the amount of weight gain that is right for you.  Avoid raw meat and uncooked cheese.  If you feel sick to your stomach (nauseous) or throw up (vomit): ? Eat 4 or 5 small meals a day instead of 3 large meals. ? Try eating a few soda crackers. ? Drink liquids between meals instead of during meals.  To prevent constipation: ? Eat foods that are high in fiber, like fresh  fruits and vegetables, whole grains, and beans. ? Drink enough fluids to keep your pee (urine) clear or pale yellow. Activity  Exercise only as told by your doctor. Stop exercising if you have cramps or pain in your lower belly (abdomen) or low back.  Do not exercise if it is too hot, too humid, or if you are in a place of great height (high altitude).  Try to avoid standing for long periods of time. Move your legs often if you must stand in one place for a long time.  Avoid heavy lifting.  Wear low-heeled shoes. Sit and stand up straight.  You can have sex unless your doctor tells you not to. Relieving pain and discomfort  Wear a good support bra if your breasts are sore.  Take warm water baths (sitz baths) to soothe pain or discomfort caused by hemorrhoids. Use hemorrhoid cream if your doctor says it is okay.  Rest with your legs raised if you have leg cramps or low back pain.  If you have puffy, bulging veins (varicose veins) in your legs: ? Wear support hose or compression stockings as told by your doctor. ? Raise (elevate) your feet for 15 minutes, 3-4 times a day. ? Limit salt in your food. Prenatal care  Schedule your prenatal visits by the twelfth week of pregnancy.  Write down your questions. Take them to your prenatal visits.  Keep all your prenatal visits as told by your doctor. This is important. Safety  Wear your seat belt at all times when driving.  Make a list of emergency phone numbers. The list should include numbers for family, friends, the hospital, and police and fire departments. General instructions  Ask your doctor for a referral to a local prenatal class. Begin classes no later than at the start of month 6 of your pregnancy.  Ask for help if you need counseling or if you need help with nutrition. Your doctor can give you advice or tell you where to go for help.  Do not use hot tubs, steam rooms, or saunas.  Do not douche or use tampons or scented  sanitary pads.  Do not cross your legs for long periods of time.  Avoid all herbs and alcohol. Avoid drugs that are not approved by your doctor.  Do not use any tobacco products, including cigarettes, chewing tobacco, and electronic cigarettes. If you need help quitting, ask your doctor. You may get counseling or other support to help you quit.  Avoid cat litter boxes and soil used by cats. These carry germs that can cause birth defects in the baby and can cause a loss of  your baby (miscarriage) or stillbirth.  Visit your dentist. At home, brush your teeth with a soft toothbrush. Be gentle when you floss. Contact a doctor if:  You are dizzy.  You have mild cramps or pressure in your lower belly.  You have a nagging pain in your belly area.  You continue to feel sick to your stomach, you throw up, or you have watery poop (diarrhea).  You have a bad smelling fluid coming from your vagina.  You have pain when you pee (urinate).  You have increased puffiness (swelling) in your face, hands, legs, or ankles. Get help right away if:  You have a fever.  You are leaking fluid from your vagina.  You have spotting or bleeding from your vagina.  You have very bad belly cramping or pain.  You gain or lose weight rapidly.  You throw up blood. It may look like coffee grounds.  You are around people who have [place] measles, fifth disease, or chickenpox.  You have a very bad headache.  You have shortness of breath.  You have any kind of trauma, such as from a fall or a car accident. Summary  The first trimester of pregnancy is from week 1 until the end of week 13 (months 1 through 3).  To take care of yourself and your unborn baby, you will need to eat healthy meals, take medicines only if your doctor tells you to do so, and do activities that are safe for you and your baby.  Keep all follow-up visits as told by your doctor. This is important as your doctor will have to ensure  that your baby is healthy and growing well. This information is not intended to replace advice given to you by your health care provider. Make sure you discuss any questions you have with your health care provider. Document Released: 09/16/2007 Document Revised: 04/07/2016 Document Reviewed: 04/07/2016 Elsevier Interactive Patient Education  2017 Else[employer] Inc. Commonly Asked Questions During Pregnancy  Cats: A parasite can be excreted in cat feces.  To avoid exposure you need to have another person empty the little box.  If you must empty the litter box you will need to wear gloves.  Wash your hands after handling your cat.  This parasite can also be found in raw or undercooked meat so this should also be avoided.  Colds, Sore Throats, Flu: Please check your medication sheet to see what you can take for symptoms.  If your symptoms are unrelieved by these medications please call the office.  Dental Work: Most any dental work Agricultural consultant recommends is permitted.  X-rays should only be taken during the first trimester if absolutely necessary.  Your abdomen should be shielded with a lead apron during all x-rays.  Please notify your provider prior to receiving any x-rays.  Novocaine is fine; gas is not recommended.  If your dentist requires a note from [place] prior to dental work please call the office and we will provide one for you.  Exercise: Exercise is an important part of staying healthy during your pregnancy.  You may continue most exercises you were accustomed to prior to pregnancy.  Later in your pregnancy you will most likely notice you have difficulty with activities requiring balance like riding a bicycle.  It is important that you listen to your body and avoid activities that put you at a higher risk of falling.  Adequate rest and staying well hydrated are a must!  If you have questions about the safety  of specific activities ask your provider.    Exposure to Children with illness: Try to avoid  obvious exposure; report any symptoms to us when noted,  If you have chicken pos, red measles or mumps, you should be immune to these diseases.   Please do not take any vaccines while pregnant unless you have checked with your OB provider.  Fetal Movement: After 28 weeks we recommend you do "kick counts" twice daily.  Lie or sit down in a calm quiet environment and count your baby movements "kicks".  You should feel your baby at least 10 times per hour.  If you have not felt 10 kicks within the first hour get up, walk around and have something sweet to eat or drink then repeat for an additional hour.  If count remains less than 10 per hour notify your provider.  Fumigating: Follow your pest control agent's advice as to how long to stay out of your home.  Ventilate the area well before re-entering.  Hemorrhoids:   Most over-the-counter preparations can be used during pregnancy.  Check your medication to see what is safe to use.  It is important to use a stool softener or fiber in your diet and to drink lots of liquids.  If hemorrhoids seem to be getting worse please call the office.   Hot Tubs:  Hot tubs Jacuzzis and saunas are not recommended while pregnant.  These increase your internal body temperature and should be avoided.  Intercourse:  Sexual intercourse is safe during pregnancy as long as you are comfortable, unless otherwise advised by your provider.  Spotting may occur after intercourse; report any bright red bleeding that is heavier than spotting.  Labor:  If you know that you are in labor, please go to the hospital.  If you are unsure, please call the office and let us help you decide what to do.  Lifting, straining, etc:  If your job requires heavy lifting or straining please check with your provider for any limitations.  Generally, you should not lift items heavier than that you can lift simply with your hands and arms (no back muscles)  Painting:  Paint fumes do not harm your pregnancy,  but may make you ill and should be avoided if possible.  Latex or water based paints have less odor than oils.  Use adequate ventilation while painting.  Permanents & Hair Color:  Chemicals in hair dyes are not recommended as they cause increase hair dryness which can increase hair loss during pregnancy.  " Highlighting" and permanents are allowed.  Dye may be absorbed differently and permanents may not hold as well during pregnancy.  Sunbathing:  Use a sunscreen, as skin burns easily during pregnancy.  Drink plenty of fluids; avoid over heating.  Tanning Beds:  Because their possible side effects are still unknown, tanning beds are not recommended.  Ultrasound Scans:  Routine ultrasounds are performed at approximately 20 weeks.  You will be able to see your baby's general anatomy an if you would like to know the gender this can usually be determined as well.  If it is questionable when you conceived you may also receive an ultrasound early in your pregnancy for dating purposes.  Otherwise ultrasound exams are not routinely performed unless there is a medical necessity.  Although you can request a scan we ask that you pay for it when conducted because insurance does not cover " patient request" scans.  Work: If your pregnancy proceeds without complications you may work  until your due date, unless your physician or employer advises otherwise.  Round Ligament Pain/Pelvic Discomfort:  Sharp, shooting pains not associated with bleeding are fairly common, usually occurring in the second trimester of pregnancy.  They tend to be worse when standing up or when you remain standing for long periods of time.  These are the result of pressure of certain pelvic ligaments called "round ligaments".  Rest, Tylenol and heat seem to be the most effective relief.  As the womb and fetus grow, they rise out of the pelvis and the discomfort improves.  Please notify the office if your pain seems different than that described.   It may represent a more serious condition.

## 2017-02-08 NOTE — Progress Notes (Signed)
Geanie KenningKendra Renae Fosdick presents for NOB nurse interview visit. Pregnancy confirmation done at ED on 01/23/2017 by [id number]. LMP: 12/06/2016 (approx.) Ultrasound of 10/13//18 resulted in 7.1wk viable fetus and moderated size subchorionic hemorrhage. ED again on 01/31/2017 for hyperemesis, given IV fluids and Nitrofurantion (but has not had this filled, I strongly encouraged pt to get medication that were prescribed to her).  Ultrasound at this time resulted in EGA-8.1wk, and subchorionic hemorrhage decreased a little in size. ED visit for sharp pain in pelvic region. [id number]. Pt encourage again to pick up her medications that were prescribed at ED, that this could be why she is having pelvic pain. Pt states they done an ultrasound in room and did not see the subchorionic hemorrhage. Pt denies any vaginal bleeding and no pelvic pain at this time.   G-1.  P-0000. Pregnancy education material explained and given. No cats in the home. NOB labs ordered. HIV labs and Drug screen were explained optional and she did not decline the HIV test.  Drug screen declined. Ultrasound ordered to check the subchorionic hemorrhage in 1-2 weeks.  PNV encouraged. Genetic screening options discussed. Genetic testing: Unsure.  Pt may discuss with provider. Pt. To follow up with provider in 3 weeks for NOB physical.  All questions answered.

## 2017-02-09 LAB — CBC WITH DIFFERENTIAL/PLATELET
BASOS ABS: 0 10*3/uL (ref 0.0–0.2)
BASOS: 0 %
EOS (ABSOLUTE): 0.1 10*3/uL (ref 0.0–0.4)
Eos: 1 %
Hematocrit: 36.2 % (ref 34.0–46.6)
Hemoglobin: 12.5 g/dL (ref 11.1–15.9)
IMMATURE GRANS (ABS): 0 10*3/uL (ref 0.0–0.1)
IMMATURE GRANULOCYTES: 0 %
LYMPHS: 22 %
Lymphocytes Absolute: 1.9 10*3/uL (ref 0.7–3.1)
MCH: 33.8 pg — ABNORMAL HIGH (ref 26.6–33.0)
MCHC: 34.5 g/dL (ref 31.5–35.7)
MCV: 98 fL — ABNORMAL HIGH (ref 79–97)
MONOCYTES: 5 %
Monocytes Absolute: 0.5 10*3/uL (ref 0.1–0.9)
NEUTROS PCT: 72 %
Neutrophils Absolute: 6.2 10*3/uL (ref 1.4–7.0)
PLATELETS: 215 10*3/uL (ref 150–379)
RBC: 3.7 x10E6/uL — ABNORMAL LOW (ref 3.77–5.28)
RDW: 12.9 % (ref 12.3–15.4)
WBC: 8.6 10*3/uL (ref 3.4–10.8)

## 2017-02-09 LAB — URINALYSIS, ROUTINE W REFLEX MICROSCOPIC
Bilirubin, UA: NEGATIVE
GLUCOSE, UA: NEGATIVE
KETONES UA: NEGATIVE
NITRITE UA: NEGATIVE
PROTEIN UA: NEGATIVE
SPEC GRAV UA: 1.021 (ref 1.005–1.030)
UUROB: 0.2 mg/dL (ref 0.2–1.0)
pH, UA: 7.5 (ref 5.0–7.5)

## 2017-02-09 LAB — MICROSCOPIC EXAMINATION
CASTS: NONE SEEN /LPF
Epithelial Cells (non renal): >10 /hpf — AB (ref 0–10)

## 2017-02-09 LAB — RUBELLA SCREEN: RUBELLA: 5.47 {index} (ref >0.99)

## 2017-02-09 LAB — ANTIBODY SCREEN: ANTIBODY SCREEN: NEGATIVE

## 2017-02-09 LAB — HEPATITIS B SURFACE ANTIGEN: HEP B S AG: NEGATIVE

## 2017-02-09 LAB — RPR: RPR: NONREACTIVE

## 2017-02-09 LAB — VARICELLA ZOSTER ANTIBODY, IGG: Varicella zoster IgG: 361 index (ref >165)

## 2017-02-09 LAB — RH TYPE: Rh Factor: POSITIVE

## 2017-02-09 LAB — ABO

## 2017-02-09 LAB — HIV ANTIBODY (ROUTINE TESTING W REFLEX): HIV SCREEN 4TH GENERATION: NONREACTIVE

## 2017-02-10 LAB — GC/CHLAMYDIA PROBE AMP
Chlamydia trachomatis, NAA: NEGATIVE
Neisseria gonorrhoeae by PCR: NEGATIVE

## 2017-02-10 LAB — CULTURE, OB URINE

## 2017-02-10 LAB — URINE CULTURE, OB REFLEX

## 2017-02-13 ENCOUNTER — Emergency Department: Payer: Medicaid Other

## 2017-02-13 ENCOUNTER — Encounter: Payer: Self-pay | Admitting: Emergency Medicine

## 2017-02-13 ENCOUNTER — Emergency Department
Admission: EM | Admit: 2017-02-13 | Discharge: 2017-02-13 | Disposition: A | Discharge status: Home / Self Care | Payer: Medicaid Other | Attending: Emergency Medicine | Admitting: Emergency Medicine

## 2017-02-13 DIAGNOSIS — Z3491 Encounter for supervision of normal pregnancy, unspecified, first trimester: Secondary | ICD-10-CM | POA: Insufficient documentation

## 2017-02-13 DIAGNOSIS — Z3A10 10 weeks gestation of pregnancy: Secondary | ICD-10-CM | POA: Insufficient documentation

## 2017-02-13 DIAGNOSIS — O219 Vomiting of pregnancy, unspecified: Secondary | ICD-10-CM | POA: Insufficient documentation

## 2017-02-13 DIAGNOSIS — O26891 Other specified pregnancy related conditions, first trimester: Secondary | ICD-10-CM | POA: Diagnosis present

## 2017-02-13 DIAGNOSIS — Z3490 Encounter for supervision of normal pregnancy, unspecified, unspecified trimester: Secondary | ICD-10-CM

## 2017-02-13 DIAGNOSIS — R102 Pelvic and perineal pain: Secondary | ICD-10-CM | POA: Insufficient documentation

## 2017-02-13 DIAGNOSIS — M549 Dorsalgia, unspecified: Secondary | ICD-10-CM | POA: Diagnosis not present

## 2017-02-13 DIAGNOSIS — Z79899 Other long term (current) drug therapy: Secondary | ICD-10-CM | POA: Insufficient documentation

## 2017-02-13 DIAGNOSIS — Z87891 Personal history of nicotine dependence: Secondary | ICD-10-CM | POA: Insufficient documentation

## 2017-02-13 DIAGNOSIS — N939 Abnormal uterine and vaginal bleeding, unspecified: Secondary | ICD-10-CM

## 2017-02-13 LAB — HCG, QUANTITATIVE, PREGNANCY: hCG, Beta Chain, Quant, S: 85566 m[IU]/mL — ABNORMAL HIGH (ref <5)

## 2017-02-13 NOTE — ED Notes (Signed)
Lavendar and light green top sent to lab

## 2017-02-13 NOTE — ED Provider Notes (Signed)
[facility] Emergency Department Provider Note  ____________________________________________  Time seen: Approximately 6:28 PM  I have reviewed the triage vital signs and the nursing notes.   HISTORY  Chief Complaint Back Pain    HPI [name] is a 21 y.o. female presents to the emergency department with concern for state of pregnancy.  Patient states that she is currently [redacted] weeks pregnant.  She was seen on 02/07/2017 after patient developed pelvic pain and vaginal bleeding.  Bedside ultrasound during aforementioned emergency department encounter appeared to reveal an intrauterine pregnancy.  Patient reports that pelvic pain resolved as well as vaginal bleeding.  Patient has assumed care with an OB/GYN and has been taking her prenatal vitamins but admits that she has not been adhering to proper nutrition or staying appropriately hydrated.  She denies chest pain, chest tightness, shortness of breath, nausea, abdominal pain or other symptoms.   Past Medical History:  Diagnosis Date  . Abnormal uterine bleeding     Patient Active Problem List   Diagnosis Date Noted  . MDD (major depressive disorder), single episode, moderate (HCC) 09/18/2016  . Self-inflicted laceration of wrist 09/17/2016  . Tobacco use disorder 09/17/2016  . Cannabis use disorder, moderate, dependence (HCC) 09/17/2016    Past Surgical History:  Procedure Laterality Date  . ORTHOPEDIC SURGERY Right arm    Prior to Admission medications   Medication Sig Start Date End Date Taking? Authorizing Provider  metroNIDAZOLE (FLAGYL) 500 MG tablet Take 1 tablet (500 mg total) by mouth every 12 (twelve) hours. Patient not taking: Reported on 02/08/2017 09/19/16   Pucilowska, Braulio Conte B, MD  ondansetron (ZOFRAN ODT) 4 MG disintegrating tablet Take 1 tablet (4 mg total) by mouth every 8 (eight) hours as needed for nausea or vomiting. Patient not taking: Reported on 02/08/2017 01/31/17    Rockne Menghini, MD  Prenatal Multivit-Min-Fe-FA (PRENATAL VITAMINS) 0.8 MG tablet Take 1 tablet by mouth daily.    [provider]  pyridOXINE (VITAMIN B-6) 50 MG tablet Take 1 tablet (50 mg total) by mouth every 8 (eight) hours as needed. For nausea or vomiting Patient not taking: Reported on 02/08/2017 01/31/17   Rockne Menghini, MD    Allergies Patient has no known allergies.  Family History  Problem Relation Age of Onset  . Diabetes Mother   . Hypertension Mother   . Hyperlipidemia Father   . Hypertension Father   . Migraines Sister   . Seizures Sister   . Breast cancer Maternal Grandmother   . Cancer Maternal Grandfather     Social History Social History  Substance Use Topics  . Smoking status: Former Smoker    Packs/day: 0.10    Types: Cigarettes    Quit date: 01/11/2017  . Smokeless tobacco: Never Used  . Alcohol use Yes     Review of Systems  Constitutional: No fever/chills Eyes: No visual changes. No discharge ENT: No upper respiratory complaints. Cardiovascular: no chest pain. Respiratory: no cough. No SOB. Gastrointestinal: No abdominal pain.  No nausea, no vomiting.  No diarrhea.  No constipation. Genitourinary: Negative for dysuria. No hematuria Musculoskeletal: Negative for musculoskeletal pain. Skin: Negative for rash, abrasions, lacerations, ecchymosis. Neurological: Negative for headaches, focal weakness or numbness.   ____________________________________________   PHYSICAL EXAM:  VITAL SIGNS: ED Triage Vitals  Enc Vitals Group     BP 02/13/17 1638 123/72     Pulse Rate 02/13/17 1638 96     Resp 02/13/17 1638 16     Temp 02/13/17  1638 98.2 F (36.8 C)     Temp Source 02/13/17 1638 Oral     SpO2 02/13/17 1638 100 %     Weight 02/13/17 1638 95 lb (43.1 kg)     Height 02/13/17 1638 5\' 4"  (1.626 m)     Head Circumference --      Peak Flow --      Pain Score 02/13/17 1714 7     Pain Loc --      Pain Edu? --       Excl. in GC? --    Constitutional: Alert and oriented. Well appearing and in no acute distress. Eyes: Conjunctivae are normal. PERRL. EOMI. Head: Atraumatic. Cardiovascular: Normal rate, regular rhythm. Normal S1 and S2.  Good peripheral circulation. Respiratory: Normal respiratory effort without tachypnea or retractions. Lungs CTAB. Good air entry to the bases with no decreased or absent breath sounds. Gastrointestinal: Bowel sounds 4 quadrants. Soft and nontender to palpation. No guarding or rigidity. No palpable masses. No distention. No CVA tenderness. Musculoskeletal: Full range of motion to all extremities. No gross deformities appreciated. Neurologic:  Normal speech and language. No gross focal neurologic deficits are appreciated.  Skin:  Skin is warm, dry and intact. No rash noted. Psychiatric: Mood and affect are normal. Speech and behavior are normal. Patient exhibits appropriate insight and judgement.  ____________________________________________   LABS (all labs ordered are listed, but only abnormal results are displayed)  Labs Reviewed  HCG, QUANTITATIVE, PREGNANCY - Abnormal; Notable for the following:       Result Value   hCG, Beta Chain, Quant, S 85,566 (*)    All other components within normal limits   ____________________________________________  EKG   ____________________________________________  RADIOLOGY Geraldo PitterI, Jaclyn M Woods, personally viewed and evaluated these images as part of my medical decision making, as well as reviewing the written report by the radiologist.    [place] Ob Comp Less 14 Wks  Result Date: 02/13/2017 CLINICAL DATA:  Pregnant patient with vaginal bleeding. EXAM: OBSTETRIC <14 WK ULTRASOUND TECHNIQUE: Transabdominal ultrasound was performed for evaluation of the gestation as well as the maternal uterus and adnexal regions. COMPARISON:  January 31, 2017 FINDINGS: Intrauterine gestational sac: Single Yolk sac:  Not Visualized. Embryo:  Visualized.  Cardiac Activity: Visualized. Heart Rate: 168 bpm MSD:   mm    w     d CRL:   33.6  mm   10 w 2 d                  [facility] Subchorionic hemorrhage: The previously identified moderate subchorionic hemorrhage is not appreciated on provided images. Maternal uterus/adnexae: Normal IMPRESSION: 1. Single live IUP. 2. The previously identified moderate subchorionic hemorrhage seen on previous studies is not appreciated on provided images today. Electronically Signed   By: Gerome Samavid  Williams III M.D   On: 02/13/2017 19:43    ____________________________________________    PROCEDURES  Procedure(s) performed:    Procedures    Medications - No data to display   ____________________________________________   INITIAL IMPRESSION / ASSESSMENT AND PLAN / ED COURSE  Pertinent labs & imaging results that were available during my care of the patient were reviewed by me and considered in my medical decision making (see chart for details).  Review of the [place] CSRS was performed in accordance of the [facility] prior to dispensing any controlled drugs.  Clinical Course as of Feb 13 2021  Sat Feb 13, 2017  1949 [place]  14 Wks [JW]    Clinical Course User Index [JW] Pia Mau M, PA-C    Assessment and Plan: Viable intrauterine pregnancy Patient presents to the emergency department with concern for evaluating beta-hCG levels.  Beta hCG levels appear to be decreasing.  It is possible that beta-hCG levels are decreasing due to patient reaching her peak.  Transvaginal ultrasound conducted in the emergency department revealed a viable intrauterine pregnancy.  Patient was advised to continue using her prenatal vitamins and to increase her consumption of protein and water.  Patient voiced understanding regarding these recommendations.  She was advised to follow-up with her OB/GYN.  Vital signs are reassuring prior to discharge.  All patient questions were answered.      ____________________________________________  FINAL CLINICAL IMPRESSION(S) / ED DIAGNOSES  Final diagnoses:  Intrauterine pregnancy      NEW MEDICATIONS STARTED DURING THIS VISIT:  Discharge Medication List as of 02/13/2017  8:08 PM          This chart was dictated using voice recognition software/Dragon. Despite best efforts to proofread, errors can occur which can change the meaning. Any change was purely unintentional.    Orvil Feil, PA-C 02/13/17 2024    Jeanmarie Plant, MD 02/13/17 [phone]

## 2017-02-13 NOTE — ED Triage Notes (Signed)
Pt c/o upper back pain since yesterday. Pt is [redacted] weeks pregnant, having some vomiting. Denies abdominal pain. Reports last time had hcg levels checked and was told they decreased. Requesting levels checked again.

## 2017-02-13 NOTE — Progress Notes (Signed)
I have reviewed the record and concur with patient management and plan of care.    Marcellina Jonsson Michelle Pieper Kasik, CNM [facility] 

## 2017-02-13 NOTE — ED Notes (Signed)
Pt reports that she is "dehydrated" - she states that she does not drink enough water - pt reports that she is [redacted] weeks pregnant and vomits daily and is unable to keep anything down - pt is c/o upper back pain

## 2017-02-22 ENCOUNTER — Ambulatory Visit (INDEPENDENT_AMBULATORY_CARE_PROVIDER_SITE_OTHER): Payer: Medicaid Other

## 2017-02-22 DIAGNOSIS — Z3401 Encounter for supervision of normal first pregnancy, first trimester: Secondary | ICD-10-CM

## 2017-02-22 DIAGNOSIS — O418X10 Other specified disorders of amniotic fluid and membranes, first trimester, not applicable or unspecified: Secondary | ICD-10-CM

## 2017-02-22 DIAGNOSIS — O468X1 Other antepartum hemorrhage, first trimester: Secondary | ICD-10-CM | POA: Diagnosis not present

## 2017-03-01 ENCOUNTER — Encounter: Payer: Self-pay | Admitting: Certified Nurse Midwife

## 2017-03-01 ENCOUNTER — Ambulatory Visit (INDEPENDENT_AMBULATORY_CARE_PROVIDER_SITE_OTHER): Payer: Medicaid Other | Admitting: Certified Nurse Midwife

## 2017-03-01 VITALS — BP 102/66 | HR 96 | Wt 95.1 lb

## 2017-03-01 DIAGNOSIS — Z3401 Encounter for supervision of normal first pregnancy, first trimester: Secondary | ICD-10-CM

## 2017-03-01 LAB — POCT URINALYSIS DIPSTICK
BILIRUBIN UA: NEGATIVE
GLUCOSE UA: NEGATIVE
Ketones, UA: NEGATIVE
Leukocytes, UA: NEGATIVE
NITRITE UA: NEGATIVE
PH UA: 7 (ref 5.0–8.0)
Protein, UA: NEGATIVE
SPEC GRAV UA: 1.02 (ref 1.010–1.025)
UROBILINOGEN UA: 0.2 U/dL

## 2017-03-01 NOTE — Patient Instructions (Signed)

## 2017-03-01 NOTE — Progress Notes (Signed)
NEW OB HISTORY AND PHYSICAL  SUBJECTIVE:       [name] is a 21 y.o. [id number] female, Patient's last menstrual period was 12/06/2016 (exact date)., Estimated Date of Delivery: 09/12/17, 488w1d, presents today for establishment of Prenatal Care. She has no unusual complaints.   Gynecologic History Patient's last menstrual period was 12/06/2016 (exact date). Normal Contraception: none Last Pap: N/A. Completed today  Obstetric History OB History  Gravida Para Term Preterm AB Living  1 0 0 0 0 0  SAB TAB Ectopic Multiple Live Births  0 0 0 0      # Outcome Date GA Lbr Len/2nd Weight Sex Delivery Anes PTL Lv  1 Current               Past Medical History:  Diagnosis Date  . Abnormal uterine bleeding     Past Surgical History:  Procedure Laterality Date  . ORTHOPEDIC SURGERY Right arm    Current Outpatient Medications on File Prior to Visit  Medication Sig Dispense Refill  . Prenatal Multivit-Min-Fe-FA (PRENATAL VITAMINS) 0.8 MG tablet Take 1 tablet by mouth daily.    . metroNIDAZOLE (FLAGYL) 500 MG tablet Take 1 tablet (500 mg total) by mouth every 12 (twelve) hours. (Patient not taking: Reported on 02/08/2017) 14 tablet 1  . ondansetron (ZOFRAN ODT) 4 MG disintegrating tablet Take 1 tablet (4 mg total) by mouth every 8 (eight) hours as needed for nausea or vomiting. (Patient not taking: Reported on 02/08/2017) 20 tablet 0  . pyridOXINE (VITAMIN B-6) 50 MG tablet Take 1 tablet (50 mg total) by mouth every 8 (eight) hours as needed. For nausea or vomiting (Patient not taking: Reported on 02/08/2017) 15 tablet 0   No current facility-administered medications on file prior to visit.     No Known Allergies  Social History   Socioeconomic History  . Marital status: Single    Spouse name: Not on file  . Number of children: Not on file  . Years of education: Not on file  . Highest education level: Not on file  Social Needs  . Financial resource strain: Not on file  .  Food insecurity - worry: Not on file  . Food insecurity - inability: Not on file  . Transportation needs - medical: Not on file  . Transportation needs - non-medical: Not on file  Occupational History  . Not on file  Tobacco Use  . Smoking status: Former Smoker    Packs/day: 0.10    Types: Cigarettes    Last attempt to quit: 01/11/2017    Years since quitting: 0.1  . Smokeless tobacco: Never Used  Substance and Sexual Activity  . Alcohol use: Yes  . Drug use: No  . Sexual activity: Yes    Birth control/protection: None  Other Topics Concern  . Not on file  Social History Narrative  . Not on file    Family History  Problem Relation Age of Onset  . Diabetes Mother   . Hypertension Mother   . Hyperlipidemia Father   . Hypertension Father   . Migraines Sister   . Seizures Sister   . Breast cancer Maternal Grandmother   . Cancer Maternal Grandfather     The following portions of the patient's history were reviewed and updated as appropriate: allergies, current medications, past OB history, past medical history, past surgical history, past family history, past social history, and problem list.    OBJECTIVE: Initial Physical Exam (New OB)  GENERAL APPEARANCE: alert, well  appearing, in no apparent distress, oriented to person, place and time HEAD: normocephalic, atraumatic MOUTH: mucous membranes moist, pharynx normal without lesions THYROID: no thyromegaly or masses present BREASTS: no masses noted, no significant tenderness, no palpable axillary nodes, no skin changes LUNGS: clear to auscultation, no wheezes, rales or rhonchi, symmetric air entry HEART: regular rate and rhythm, no murmurs ABDOMEN: soft, nontender, nondistended, no abnormal masses, no epigastric pain, fundus soft, nontender 12 weeks size and FHT present EXTREMITIES: no redness or tenderness in the calves or thighs, no edema, no limitation in range of motion, intact peripheral pulses SKIN: normal coloration  and turgor, no rashes LYMPH NODES: no adenopathy palpable NEUROLOGIC: alert, oriented, normal speech, no focal findings or movement disorder noted  PELVIC EXAM EXTERNAL GENITALIA: normal appearing vulva with no masses, tenderness or lesions VAGINA: no abnormal discharge or lesions CERVIX: no lesions or cervical motion tenderness UTERUS: gravid ADNEXA: no masses palpable and nontender OB EXAM PELVIMETRY: appears adequate RECTUM: exam not indicated  ASSESSMENT: Normal pregnancy  PLAN: New OB counseling: The patient has been given an overview regarding routine prenatal care. Recommendations regarding diet, weight gain, and exercise in pregnancy were given. Prenatal testing, optional genetic testing, and carrier screening. She is requesting the panorama testing, kits are out at this time. Ultrasound use in pregnancy were reviewed. Benefits of Breast Feeding were discussed. The patient is encouraged to consider nursing her baby post partum. Follow up in 4 wks.   Doreene BurkeAnnie Lainey Nelson, CNM

## 2017-03-01 NOTE — Progress Notes (Signed)
ROB- Pt states she is doing well, no complaints  

## 2017-03-03 ENCOUNTER — Encounter: Payer: Self-pay | Admitting: Certified Nurse Midwife

## 2017-03-03 LAB — PAP IG, CT-NG, RFX HPV ASCU
CHLAMYDIA, NUC. ACID AMP: NEGATIVE
Gonococcus by Nucleic Acid Amp: NEGATIVE
PAP Smear Comment: 0

## 2017-03-07 ENCOUNTER — Encounter: Payer: Self-pay | Admitting: Emergency Medicine

## 2017-03-07 ENCOUNTER — Other Ambulatory Visit: Payer: Self-pay

## 2017-03-07 ENCOUNTER — Emergency Department
Admission: EM | Admit: 2017-03-07 | Discharge: 2017-03-07 | Disposition: A | Discharge status: Home / Self Care | Payer: Medicaid Other | Attending: Emergency Medicine | Admitting: Emergency Medicine

## 2017-03-07 DIAGNOSIS — Z3A11 11 weeks gestation of pregnancy: Secondary | ICD-10-CM | POA: Diagnosis not present

## 2017-03-07 DIAGNOSIS — O209 Hemorrhage in early pregnancy, unspecified: Secondary | ICD-10-CM | POA: Diagnosis not present

## 2017-03-07 DIAGNOSIS — Z5321 Procedure and treatment not carried out due to patient leaving prior to being seen by health care provider: Secondary | ICD-10-CM | POA: Insufficient documentation

## 2017-03-07 LAB — HCG, QUANTITATIVE, PREGNANCY: HCG, BETA CHAIN, QUANT, S: 49767 m[IU]/mL — AB (ref <5)

## 2017-03-07 NOTE — ED Triage Notes (Signed)
Pt states is approx [redacted] weeks pregnant, states this morning sat up and began to have vaginal bleeding at approx 0200. Pt states initially was bright red then slowly progressed to "brown-ish", and then resolved on it's own. Pt states was told at an US that she had "some kind of hemorrhage that should resolve on it's own". On review of chart pt hx states subchorionic hemorrhage on US on 11/3. Pt denies any bleeding at this time. Pt denies any abdominal pain, denies any urinary symptoms, denies any other symptoms, pt is visualized in NAD at this time.

## 2017-03-07 NOTE — ED Notes (Signed)
Called patient without answer in lobby.

## 2017-03-07 NOTE — ED Notes (Signed)
Called patient again without answer in the lobby.

## 2017-03-08 ENCOUNTER — Telehealth: Payer: Self-pay | Admitting: Emergency Medicine

## 2017-03-08 ENCOUNTER — Telehealth: Payer: Self-pay | Admitting: Certified Nurse Midwife

## 2017-03-08 ENCOUNTER — Telehealth: Payer: Self-pay | Admitting: Obstetrics and Gynecology

## 2017-03-08 NOTE — Telephone Encounter (Signed)
See telephone encounter.

## 2017-03-08 NOTE — Telephone Encounter (Signed)
The patient was seen in the ER and left without being seen the ED called over and stated that the patient level are getting extremely low and they would like for  a provider to be aware of the situation and make sure the patient is still being seen. Please advise.

## 2017-03-08 NOTE — Telephone Encounter (Signed)
Called [facility] to inform of hcg done here, and patient left without seeing provider.  A message will be sent to provider regarding reivew of labs.

## 2017-03-08 NOTE — Telephone Encounter (Signed)
Patient called and stated that she is experiencing a lot (heavy/bright red) bleeding for a few hours on 03/06/17 @ 2 am. The patient would like to speak with a nurse to discuss wether or not she needs to be seen sooner. The patient has several questions and is concerned. No other information was disclosed. Please advise.

## 2017-03-08 NOTE — Telephone Encounter (Signed)
  Early on 13 weeks Pt states she had only 1 day of heavy vaginal bleeding on 11/24. NO cramps. Pt is not bleeding today. NO cramps or fever today. Went to ed- yesterday  beta is deceasing. Appt made with AT Tuesday at 2:30. Pt aware if bleeding or severe pain develop to contact office asap.

## 2017-03-08 NOTE — Telephone Encounter (Signed)
LMTRC. Beta is decreasing. Needs to be seen.

## 2017-03-09 ENCOUNTER — Encounter: Payer: Self-pay | Admitting: Certified Nurse Midwife

## 2017-03-09 ENCOUNTER — Ambulatory Visit (INDEPENDENT_AMBULATORY_CARE_PROVIDER_SITE_OTHER): Payer: Medicaid Other | Admitting: Certified Nurse Midwife

## 2017-03-09 ENCOUNTER — Other Ambulatory Visit (INDEPENDENT_AMBULATORY_CARE_PROVIDER_SITE_OTHER): Payer: Medicaid Other

## 2017-03-09 ENCOUNTER — Other Ambulatory Visit: Payer: Self-pay | Admitting: Certified Nurse Midwife

## 2017-03-09 ENCOUNTER — Other Ambulatory Visit: Payer: Self-pay

## 2017-03-09 DIAGNOSIS — O200 Threatened abortion: Secondary | ICD-10-CM

## 2017-03-09 DIAGNOSIS — N911 Secondary amenorrhea: Secondary | ICD-10-CM

## 2017-03-09 DIAGNOSIS — O4692 Antepartum hemorrhage, unspecified, second trimester: Secondary | ICD-10-CM

## 2017-03-09 LAB — POCT URINALYSIS DIPSTICK
Bilirubin, UA: NEGATIVE
GLUCOSE UA: NEGATIVE
Ketones, UA: NEGATIVE
Leukocytes, UA: NEGATIVE
NITRITE UA: NEGATIVE
PH UA: 6 (ref 5.0–8.0)
PROTEIN UA: NEGATIVE
RBC UA: NEGATIVE
SPEC GRAV UA: 1.02 (ref 1.010–1.025)
UROBILINOGEN UA: 0.2 U/dL

## 2017-03-09 NOTE — Progress Notes (Signed)
Pt presents today for problem visit. She had bleeding Saturday night that was heavy like a period. She has not had any bleeding since. She denies cramping/contractions. Ultrasound today was 13 1/7 week Viable Singleton Intrauterine pregnancy by U/S. ( see below for full report). Reassurance given. Panorama testing today. Follow up at next scheduled appointment.   Doreene BurkeAnnie Sofiah Warner, CNM   ULTRASOUND REPORT  Location: [facility] Date of Service:  03/09/17  Indications: Viability for vaginal bleeding Findings:  Mason JimSingleton intrauterine pregnancy is visualized with a CRL consistent with 13 1/[redacted] weeks gestation, giving an (U/S) EDD of 09/13/17. The (U/S) EDD is consistent with the clinically established (LMP) EDD of 09/12/17.  FHR: 162 BPM CRL measurement: 69.2 mm Yolk sac was not visualized. Early anatomy appears WNL.  Neither ovary was visualized due to overlying bowel gas. Survey of the adnexa demonstrates no adnexal masses. There is no free peritoneal fluid in the cul de sac.  Impression: 1. 13 1/7 week Viable Singleton Intrauterine pregnancy by U/S. 2. (U/S) EDD is consistent with Clinically established (LMP) EDD of 09/12/17.  Recommendations: 1.Clinical correlation with the patient's History and Physical Exam.   Kari BaarsJill Long, RDMS

## 2017-03-09 NOTE — Addendum Note (Signed)
Addended by: Jackquline DenmarkIDGEWAY, Maximilliano Kersh W on: 03/09/2017 04:54 PM   Modules accepted: Orders

## 2017-03-09 NOTE — Patient Instructions (Signed)

## 2017-03-17 ENCOUNTER — Telehealth: Payer: Self-pay | Admitting: Certified Nurse Midwife

## 2017-03-17 ENCOUNTER — Telehealth: Payer: Self-pay

## 2017-03-17 NOTE — Telephone Encounter (Signed)
Pt notified of Panorama results, normal, low risk. Pt nor fob was not told the sex of baby per her and fob request.  Pt wanted her mother Johney MaineKimberly Gulley [phone]) to be notified of sex of baby and FOB mother Renette Butters(Angel Copeland, [phone]); gender is female. Both were notified.

## 2017-03-17 NOTE — Telephone Encounter (Signed)
Called to review genetic test results. Left message for her to call back.   Doreene BurkeAnnie Alaycia Eardley, CNM

## 2017-03-29 ENCOUNTER — Telehealth: Payer: Self-pay | Admitting: Obstetrics and Gynecology

## 2017-03-29 NOTE — Telephone Encounter (Signed)
Don ([facility] [employer]) states pt called and stated she canceled her gender reveal and requested to know the sex of the baby. Attempted to reach pt no answer left voicemail to call back

## 2017-03-30 ENCOUNTER — Encounter: Payer: Self-pay | Admitting: Obstetrics and Gynecology

## 2017-03-31 ENCOUNTER — Encounter: Payer: Self-pay | Admitting: Certified Nurse Midwife

## 2017-03-31 ENCOUNTER — Ambulatory Visit (INDEPENDENT_AMBULATORY_CARE_PROVIDER_SITE_OTHER): Payer: Medicaid Other | Admitting: Certified Nurse Midwife

## 2017-03-31 VITALS — BP 100/72 | HR 95 | Wt 99.0 lb

## 2017-03-31 DIAGNOSIS — Z3402 Encounter for supervision of normal first pregnancy, second trimester: Secondary | ICD-10-CM

## 2017-03-31 LAB — POCT URINALYSIS DIPSTICK
BILIRUBIN UA: NEGATIVE
Glucose, UA: NEGATIVE
KETONES UA: NEGATIVE
Leukocytes, UA: NEGATIVE
NITRITE UA: NEGATIVE
PH UA: 7 (ref 5.0–8.0)
PROTEIN UA: NEGATIVE
SPEC GRAV UA: 1.015 (ref 1.010–1.025)
UROBILINOGEN UA: 0.2 U/dL

## 2017-03-31 NOTE — Progress Notes (Signed)
ROB, doing well. States that she has nausea in the mornings but other wise is feeling great. Discussed anatomy scan at next visit. She verbalizes understanding and agrees to plan of care. She will follow up in 4 wks.   Doreene BurkeAnnie Lakeia Bradshaw, CNM

## 2017-03-31 NOTE — Progress Notes (Signed)
ROB- Pt states she is doing well, no complaints  

## 2017-03-31 NOTE — Patient Instructions (Signed)

## 2017-04-13 HISTORY — PX: OTHER SURGICAL HISTORY: SHX169

## 2017-04-13 NOTE — L&D Delivery Note (Signed)
Delivery Summary for [name]  Labor Events:   Preterm labor:   Rupture date: 08/20/2017  Rupture time: 12:00 AM  Rupture type: Spontaneous  Fluid Color:   Induction:   Augmentation:   Complications:   Cervical ripening:          Delivery:   Episiotomy:   Lacerations:   Repair suture:   Repair # of packets:   Blood loss (ml): 500   Information for the patient's newborn:  [name], [name] [[id number]]    Delivery 08/20/2017 3:33 AM by  C-Section, Low Transverse Sex:  female Gestational Age: [redacted]w[redacted]d Delivery Clinician:   Living?:         APGARS  One minute Five minutes Ten minutes  Skin color:        Heart rate:        Grimace:        Muscle tone:        Breathing:        Totals: 8  9      Presentation/position:      Resuscitation:   Cord information:    Disposition of cord blood:     Blood gases sent?  Complications:   Placenta: Delivered:       appearance Newborn Measurements: Weight: 4 lb 5.1 oz (1960 g)  Height:    Head circumference:    Chest circumference:    Other providers:    Additional  information: Forceps:   Vacuum:   Breech:   Observed anomalies       Please see Dr. Oretha Milch operative note for details of C-section procedure.   Hildred Laser, MD [facility]

## 2017-04-19 ENCOUNTER — Other Ambulatory Visit: Payer: Self-pay | Admitting: Certified Nurse Midwife

## 2017-04-19 DIAGNOSIS — Z369 Encounter for antenatal screening, unspecified: Secondary | ICD-10-CM

## 2017-04-29 ENCOUNTER — Ambulatory Visit: Payer: Medicaid Other | Admitting: Certified Nurse Midwife

## 2017-04-29 ENCOUNTER — Ambulatory Visit (INDEPENDENT_AMBULATORY_CARE_PROVIDER_SITE_OTHER): Payer: Medicaid Other

## 2017-04-29 DIAGNOSIS — Z3402 Encounter for supervision of normal first pregnancy, second trimester: Secondary | ICD-10-CM | POA: Diagnosis not present

## 2017-04-29 DIAGNOSIS — Z369 Encounter for antenatal screening, unspecified: Secondary | ICD-10-CM | POA: Diagnosis not present

## 2017-04-29 DIAGNOSIS — Z3689 Encounter for other specified antenatal screening: Secondary | ICD-10-CM

## 2017-04-29 NOTE — Progress Notes (Signed)
Pt left office after anatomy scan and did not return for ROB. RTC x 2 weeks for follow up US due to incomplete anatomy scan. RTC x  4 weeks for ROB with Pattricia BossAnnie or sooner if needed.  ULTRASOUND REPORT  Location: [facility] Date of Service: 04/29/2017  Indications: Anatomy Findings:  Singleton intrauterine pregnancy is visualized with FHR at 147 BPM. Biometrics give an (U/S) Gestational age of 22 6/7 weeks and an (U/S) EDD of 09/17/17; this correlates with the clinically established EDD of 09/12/17.  Fetal presentation is breech.  EFW: 331 grams (0lb 12oz). Placenta: Anterior Fundal and grade 1. AFI: WNL subjectively.  Anatomic survey is incomplete.  Views and measurements of the fetal kidneys are needed to complete exam.  Renal arteries were demonstrated on today's exam.  All visualized anatomy appears WNL; Gender - Female.   Neither ovary was visualized due to overlying bowel gas. Survey of the adnexa demonstrates no adnexal masses. There is no free peritoneal fluid in the cul de sac.  Impression: 1. 19 6/7 week Viable Singleton Intrauterine pregnancy by U/S. 2. (U/S) EDD is consistent with Clinically established (LMP) EDD of 09/12/17. 3. Incomplete anatomy scan.  Views of the fetal kidneys are needed to complete exam.  Recommendations: 1.Clinical correlation with the patient's History and Physical Exam. 2. F/U U/S recommended in 2 weeks to complete anatomical survey.

## 2017-04-30 NOTE — Patient Instructions (Signed)
Common Medications Safe in Pregnancy  Acne:      Constipation:  Benzoyl Peroxide     Colace  Clindamycin      Dulcolax Suppository  Topica Erythromycin     Fibercon  Salicylic Acid      Metamucil         Miralax AVOID:        Senakot   Accutane    Cough:  Retin-A       Cough Drops  Tetracycline      Phenergan w/ Codeine if Rx  Minocycline      Robitussin (Plain & DM)  Antibiotics:     Crabs/Lice:  Ceclor       RID  Cephalosporins    AVOID:  E-Mycins      Kwell  Keflex  Macrobid/Macrodantin   Diarrhea:  Penicillin      Kao-Pectate  Zithromax      Imodium AD         PUSH FLUIDS AVOID:       Cipro     Fever:  Tetracycline      Tylenol (Regular or Extra  Minocycline       Strength)  Levaquin      Extra Strength-Do not          Exceed 8 tabs/24 hrs Caffeine:        <200mg/day (equiv. To 1 cup of coffee or  approx. 3 12 oz sodas)         Gas: Cold/Hayfever:       Gas-X  Benadryl      Mylicon  Claritin       Phazyme  **Claritin-D        Chlor-Trimeton    Headaches:  Dimetapp      ASA-Free Excedrin  Drixoral-Non-Drowsy     Cold Compress  Mucinex (Guaifenasin)     Tylenol (Regular or Extra  Sudafed/Sudafed-12 Hour     Strength)  **Sudafed PE Pseudoephedrine   Tylenol Cold & Sinus     Vicks Vapor Rub  Zyrtec  **AVOID if Problems With Blood Pressure         Heartburn: Avoid lying down for at least 1 hour after meals  Aciphex      Maalox     Rash:  Milk of Magnesia     Benadryl    Mylanta       1% Hydrocortisone Cream  Pepcid  Pepcid Complete   Sleep Aids:  Prevacid      Ambien   Prilosec       Benadryl  Rolaids       Chamomile Tea  Tums (Limit 4/day)     Unisom  Zantac       Tylenol PM         Warm milk-add vanilla or  Hemorrhoids:       Sugar for taste  Anusol/Anusol H.C.  (RX: Analapram 2.5%)  Sugar Substitutes:  Hydrocortisone OTC     Ok in moderation  Preparation H      Tucks        Vaseline lotion applied to tissue with  wiping    Herpes:     Throat:  Acyclovir      Oragel  Famvir  Valtrex     Vaccines:         Flu Shot Leg Cramps:       *Gardasil  Benadryl      Hepatitis A         Hepatitis B Nasal Spray:         Pneumovax  Saline Nasal Spray     Polio Booster         Tetanus Nausea:       Tuberculosis test or PPD  Vitamin B6 25 mg TID   AVOID:    Dramamine      *Gardasil  Emetrol       Live Poliovirus  Ginger Root 250 mg QID    MMR (measles, mumps &  High Complex Carbs @ Bedtime    rebella)  Sea Bands-Accupressure    Varicella (Chickenpox)  Unisom 1/2 tab TID     *No known complications           If received before Pain:         Known pregnancy;   Darvocet       Resume series after  Lortab        Delivery  Percocet    Yeast:   Tramadol      Femstat  Tylenol 3      Gyne-lotrimin  Ultram       Monistat  Vicodin           MISC:         All Sunscreens           Hair Coloring/highlights          Insect Repellant's          (Including DEET)         Mystic Tans Round Ligament Pain The round ligament is a cord of muscle and tissue that helps to support the uterus. It can become a source of pain during pregnancy if it becomes stretched or twisted as the baby grows. The pain usually begins in the second trimester of pregnancy, and it can come and go until the baby is delivered. It is not a serious problem, and it does not cause harm to the baby. Round ligament pain is usually a short, sharp, and pinching pain, but it can also be a dull, lingering, and aching pain. The pain is felt in the lower side of the abdomen or in the groin. It usually starts deep in the groin and moves up to the outside of the hip area. Pain can occur with:  A sudden change in position.  Rolling over in bed.  Coughing or sneezing.  Physical activity.  Follow these instructions at home: Watch your condition for any changes. Take these steps to help with your pain:  When the pain starts, relax. Then try: ? Sitting  down. ? Flexing your knees up to your abdomen. ? Lying on your side with one pillow under your abdomen and another pillow between your legs. ? Sitting in a warm bath for 15-20 minutes or until the pain goes away.  Take over-the-counter and prescription medicines only as told by your health care provider.  Move slowly when you sit and stand.  Avoid long walks if they cause pain.  Stop or lessen your physical activities if they cause pain.  Contact a health care provider if:  Your pain does not go away with treatment.  You feel pain in your back that you did not have before.  Your medicine is not helping. Get help right away if:  You develop a fever or chills.  You develop uterine contractions.  You develop vaginal bleeding.  You develop nausea or vomiting.  You develop diarrhea.  You have pain when you urinate. This information is not intended to replace advice given to you by your health  care provider. Make sure you discuss any questions you have with your health care provider. Document Released: 01/07/2008 Document Revised: 09/05/2015 Document Reviewed: 06/06/2014 Elsevier Interactive Patient Education  2018 [employer]  

## 2017-05-27 ENCOUNTER — Other Ambulatory Visit: Payer: Self-pay | Admitting: Certified Nurse Midwife

## 2017-05-27 DIAGNOSIS — IMO0002 Reserved for concepts with insufficient information to code with codable children: Secondary | ICD-10-CM

## 2017-05-27 DIAGNOSIS — Z0489 Encounter for examination and observation for other specified reasons: Secondary | ICD-10-CM

## 2017-05-28 ENCOUNTER — Ambulatory Visit (INDEPENDENT_AMBULATORY_CARE_PROVIDER_SITE_OTHER): Payer: Medicaid Other

## 2017-05-28 ENCOUNTER — Ambulatory Visit (INDEPENDENT_AMBULATORY_CARE_PROVIDER_SITE_OTHER): Payer: Medicaid Other | Admitting: Certified Nurse Midwife

## 2017-05-28 ENCOUNTER — Encounter: Payer: Self-pay | Admitting: Certified Nurse Midwife

## 2017-05-28 VITALS — BP 100/79 | HR 61 | Wt 104.8 lb

## 2017-05-28 DIAGNOSIS — IMO0002 Reserved for concepts with insufficient information to code with codable children: Secondary | ICD-10-CM

## 2017-05-28 DIAGNOSIS — Z0489 Encounter for examination and observation for other specified reasons: Secondary | ICD-10-CM

## 2017-05-28 DIAGNOSIS — Z3402 Encounter for supervision of normal first pregnancy, second trimester: Secondary | ICD-10-CM | POA: Diagnosis not present

## 2017-05-28 LAB — POCT URINALYSIS DIPSTICK
BILIRUBIN UA: NEGATIVE
Glucose, UA: NEGATIVE
KETONES UA: 160
Leukocytes, UA: NEGATIVE
NITRITE UA: 30
PH UA: 8 (ref 5.0–8.0)
PROTEIN UA: 7
SPEC GRAV UA: 1.015 (ref 1.010–1.025)
UROBILINOGEN UA: 0.2 U/dL

## 2017-05-28 NOTE — Progress Notes (Signed)
ROB PT IS DOING WELL.

## 2017-05-28 NOTE — Progress Notes (Signed)
ROB, doing well. No complaints. Reviewed ultrasound results. Anticipatory guidance for 28 wk visit. She verbalizes understanding and agrees to plan of care. Follow up 4 wks.  Doreene BurkeAnnie Ammaar Encina, CNM   ULTRASOUND REPORT  Location: [facility] Date of Service:  05/28/2017  Indications: F/U Anatomy Findings:  Mason JimSingleton intrauterine pregnancy is visualized with FHR at 155 BPM. Biometrics give an (U/S) Gestational age of 22 6/7 weeks and an (U/S) EDD of 09/18/17; this correlates with the clinically established EDD of 09/12/17.  Fetal presentation is breech.  EFW: 670 grams (1lb 8oz).  37th percentile.  BPD measures 4th percnetile, HC measures 1st percentile, and AC measures 19th percentile. Placenta: Fundal and grade 1. AFI: 10.4 cm.  Anatomic survey is now complete after obtaining views of the fetal kidneys.  All visualized anatomy appears WNL at this time.   Impression: 1. 23 6/7 week Viable Singleton Intrauterine pregnancy by U/S. 2. (U/S) EDD is consistent with Clinically established (LMP) EDD of 09/12/17. 3. Anatomy scan is now complete and appears WNL.  Recommendations: 1.Clinical correlation with the patient's History and Physical Exam.   Kari BaarsJill Long, RDMS

## 2017-05-28 NOTE — Patient Instructions (Signed)

## 2017-06-25 ENCOUNTER — Encounter: Payer: Self-pay | Admitting: Certified Nurse Midwife

## 2017-06-29 ENCOUNTER — Ambulatory Visit (INDEPENDENT_AMBULATORY_CARE_PROVIDER_SITE_OTHER): Payer: Medicaid Other | Admitting: Certified Nurse Midwife

## 2017-06-29 ENCOUNTER — Encounter: Payer: Self-pay | Admitting: Certified Nurse Midwife

## 2017-06-29 VITALS — BP 100/58 | HR 100 | Wt 107.4 lb

## 2017-06-29 DIAGNOSIS — Z3403 Encounter for supervision of normal first pregnancy, third trimester: Secondary | ICD-10-CM

## 2017-06-29 LAB — POCT URINALYSIS DIPSTICK
Bilirubin, UA: NEGATIVE
Glucose, UA: NEGATIVE
KETONES UA: NEGATIVE
LEUKOCYTES UA: NEGATIVE
NITRITE UA: NEGATIVE
PH UA: 7 (ref 5.0–8.0)
PROTEIN UA: NEGATIVE
RBC UA: NEGATIVE
Spec Grav, UA: 1.025 (ref 1.010–1.025)
UROBILINOGEN UA: 0.2 U/dL

## 2017-06-29 MED ORDER — TETANUS-DIPHTH-ACELL PERTUSSIS 5-2.5-18.5 LF-MCG/0.5 IM SUSP
0.5000 mL | Freq: Once | INTRAMUSCULAR | Status: AC
  Administered 2017-06-29: 0.5 mL via INTRAMUSCULAR

## 2017-06-29 NOTE — Progress Notes (Signed)
ROB, doing well. No complaints. Feels good fetal movement. Denies contractions .BTC/Tdap today. She will return tomorrow for her 1 hr glucola/cbc/rpr testing. ROB in 2 wks.  Doreene BurkeAnnie Shakiah Wester, CNM

## 2017-06-29 NOTE — Progress Notes (Signed)
Pt is here for an ROB visit. 

## 2017-06-29 NOTE — Addendum Note (Signed)
Addended by: Brooke DareSICK, Vena Bassinger L on: 06/29/2017 01:20 PM   Modules accepted: Orders

## 2017-06-29 NOTE — Patient Instructions (Signed)
Breastfeeding Choosing to breastfeed is one of the best decisions you can make for yourself and your baby. A change in hormones during pregnancy causes your breasts to make breast milk in your milk-producing glands. Hormones prevent breast milk from being released before your baby is born. They also prompt milk flow after birth. Once breastfeeding has begun, thoughts of your baby, as well as his or her sucking or crying, can stimulate the release of milk from your milk-producing glands. Benefits of breastfeeding Research shows that breastfeeding offers many health benefits for infants and mothers. It also offers a cost-free and convenient way to feed your baby. For your baby  Your first milk (colostrum) helps your baby's digestive system to function better.  Special cells in your milk (antibodies) help your baby to fight off infections.  Breastfed babies are less likely to develop asthma, allergies, obesity, or type 2 diabetes. They are also at lower risk for sudden infant death syndrome (SIDS).  Nutrients in breast milk are better able to meet your baby's needs compared to infant formula.  Breast milk improves your baby's brain development. For you  Breastfeeding helps to create a very special bond between you and your baby.  Breastfeeding is convenient. Breast milk costs nothing and is always available at the correct temperature.  Breastfeeding helps to burn calories. It helps you to lose the weight that you gained during pregnancy.  Breastfeeding makes your uterus return faster to its size before pregnancy. It also slows bleeding (lochia) after you give birth.  Breastfeeding helps to lower your risk of developing type 2 diabetes, osteoporosis, rheumatoid arthritis, cardiovascular disease, and breast, ovarian, uterine, and endometrial cancer later in life. Breastfeeding basics Starting breastfeeding  Find a comfortable place to sit or lie down, with your neck and back  well-supported.  Place a pillow or a rolled-up blanket under your baby to bring him or her to the level of your breast (if you are seated). Nursing pillows are specially designed to help support your arms and your baby while you breastfeed.  Make sure that your baby's tummy (abdomen) is facing your abdomen.  Gently massage your breast. With your fingertips, massage from the outer edges of your breast inward toward the nipple. This encourages milk flow. If your milk flows slowly, you may need to continue this action during the feeding.  Support your breast with 4 fingers underneath and your thumb above your nipple (make the letter "C" with your hand). Make sure your fingers are well away from your nipple and your baby's mouth.  Stroke your baby's lips gently with your finger or nipple.  When your baby's mouth is open wide enough, quickly bring your baby to your breast, placing your entire nipple and as much of the areola as possible into your baby's mouth. The areola is the colored area around your nipple. ? More areola should be visible above your baby's upper lip than below the lower lip. ? Your baby's lips should be opened and extended outward (flanged) to ensure an adequate, comfortable latch. ? Your baby's tongue should be between his or her lower gum and your breast.  Make sure that your baby's mouth is correctly positioned around your nipple (latched). Your baby's lips should create a seal on your breast and be turned out (everted).  It is common for your baby to suck about 2-3 minutes in order to start the flow of breast milk. Latching Teaching your baby how to latch onto your breast properly is very  important. An improper latch can cause nipple pain, decreased milk supply, and poor weight gain in your baby. Also, if your baby is not latched onto your nipple properly, he or she may swallow some air during feeding. This can make your baby fussy. Burping your baby when you switch breasts  during the feeding can help to get rid of the air. However, teaching your baby to latch on properly is still the best way to prevent fussiness from swallowing air while breastfeeding. Signs that your baby has successfully latched onto your nipple  Silent tugging or silent sucking, without causing you pain. Infant's lips should be extended outward (flanged).  Swallowing heard between every 3-4 sucks once your milk has started to flow (after your let-down milk reflex occurs).  Muscle movement above and in front of his or her ears while sucking.  Signs that your baby has not successfully latched onto your nipple  Sucking sounds or smacking sounds from your baby while breastfeeding.  Nipple pain.  If you think your baby has not latched on correctly, slip your finger into the corner of your baby's mouth to break the suction and place it between your baby's gums. Attempt to start breastfeeding again. Signs of successful breastfeeding Signs from your baby  Your baby will gradually decrease the number of sucks or will completely stop sucking.  Your baby will fall asleep.  Your baby's body will relax.  Your baby will retain a small amount of milk in his or her mouth.  Your baby will let go of your breast by himself or herself.  Signs from you  Breasts that have increased in firmness, weight, and size 1-3 hours after feeding.  Breasts that are softer immediately after breastfeeding.  Increased milk volume, as well as a change in milk consistency and color by the fifth day of breastfeeding.  Nipples that are not sore, cracked, or bleeding.  Signs that your baby is getting enough milk  Wetting at least 1-2 diapers during the first 24 hours after birth.  Wetting at least 5-6 diapers every 24 hours for the first week after birth. The urine should be clear or pale yellow by the age of 5 days.  Wetting 6-8 diapers every 24 hours as your baby continues to grow and develop.  At least 3  stools in a 24-hour period by the age of 5 days. The stool should be soft and yellow.  At least 3 stools in a 24-hour period by the age of 7 days. The stool should be seedy and yellow.  No loss of weight greater than 10% of birth weight during the first 3 days of life.  Average weight gain of 4-7 oz (113-198 g) per week after the age of 4 days.  Consistent daily weight gain by the age of 5 days, without weight loss after the age of 2 weeks. After a feeding, your baby may spit up a small amount of milk. This is normal. Breastfeeding frequency and duration Frequent feeding will help you make more milk and can prevent sore nipples and extremely full breasts (breast engorgement). Breastfeed when you feel the need to reduce the fullness of your breasts or when your baby shows signs of hunger. This is called "breastfeeding on demand." Signs that your baby is hungry include:  Increased alertness, activity, or restlessness.  Movement of the head from side to side.  Opening of the mouth when the corner of the mouth or cheek is stroked (rooting).  Increased sucking sounds,  smacking lips, cooing, sighing, or squeaking.  Hand-to-mouth movements and sucking on fingers or hands.  Fussing or crying.  Avoid introducing a pacifier to your baby in the first 4-6 weeks after your baby is born. After this time, you may choose to use a pacifier. Research has shown that pacifier use during the first year of a baby's life decreases the risk of sudden infant death syndrome (SIDS). Allow your baby to feed on each breast as long as he or she wants. When your baby unlatches or falls asleep while feeding from the first breast, offer the second breast. Because newborns are often sleepy in the first few weeks of life, you may need to awaken your baby to get him or her to feed. Breastfeeding times will vary from baby to baby. However, the following rules can serve as a guide to help you make sure that your baby is  properly fed:  Newborns (babies 47 weeks of age or younger) may breastfeed every 1-3 hours.  Newborns should not go without breastfeeding for longer than 3 hours during the day or 5 hours during the night.  You should breastfeed your baby a minimum of 8 times in a 24-hour period.  Breast milk pumping Pumping and storing breast milk allows you to make sure that your baby is exclusively fed your breast milk, even at times when you are unable to breastfeed. This is especially important if you go back to work while you are still breastfeeding, or if you are not able to be present during feedings. Your lactation consultant can help you find a method of pumping that works best for you and give you guidelines about how long it is safe to store breast milk. Caring for your breasts while you breastfeed Nipples can become dry, cracked, and sore while breastfeeding. The following recommendations can help keep your breasts moisturized and healthy:  Avoid using soap on your nipples.  Wear a supportive bra designed especially for nursing. Avoid wearing underwire-style bras or extremely tight bras (sports bras).  Air-dry your nipples for 3-4 minutes after each feeding.  Use only cotton bra pads to absorb leaked breast milk. Leaking of breast milk between feedings is normal.  Use lanolin on your nipples after breastfeeding. Lanolin helps to maintain your skin's normal moisture barrier. Pure lanolin is not harmful (not toxic) to your baby. You may also hand express a few drops of breast milk and gently massage that milk into your nipples and allow the milk to air-dry.  In the first few weeks after giving birth, some women experience breast engorgement. Engorgement can make your breasts feel heavy, warm, and tender to the touch. Engorgement peaks within 3-5 days after you give birth. The following recommendations can help to ease engorgement:  Completely empty your breasts while breastfeeding or pumping. You  may want to start by applying warm, moist heat (in the shower or with warm, water-soaked hand towels) just before feeding or pumping. This increases circulation and helps the milk flow. If your baby does not completely empty your breasts while breastfeeding, pump any extra milk after he or she is finished.  Apply ice packs to your breasts immediately after breastfeeding or pumping, unless this is too uncomfortable for you. To do this: ? Put ice in a plastic bag. ? Place a towel between your skin and the bag. ? Leave the ice on for 20 minutes, 2-3 times a day.  Make sure that your baby is latched on and positioned properly while  breastfeeding.  If engorgement persists after 48 hours of following these recommendations, contact your health care provider or a Science writer. Overall health care recommendations while breastfeeding  Eat 3 healthy meals and 3 snacks every day. Well-nourished mothers who are breastfeeding need an additional 450-500 calories a day. You can meet this requirement by increasing the amount of a balanced diet that you eat.  Drink enough water to keep your urine pale yellow or clear.  Rest often, relax, and continue to take your prenatal vitamins to prevent fatigue, stress, and low vitamin and mineral levels in your body (nutrient deficiencies).  Do not use any products that contain nicotine or tobacco, such as cigarettes and e-cigarettes. Your baby may be harmed by chemicals from cigarettes that pass into breast milk and exposure to secondhand smoke. If you need help quitting, ask your health care provider.  Avoid alcohol.  Do not use illegal drugs or marijuana.  Talk with your health care provider before taking any medicines. These include over-the-counter and prescription medicines as well as vitamins and herbal supplements. Some medicines that may be harmful to your baby can pass through breast milk.  It is possible to become pregnant while breastfeeding. If  birth control is desired, ask your health care provider about options that will be safe while breastfeeding your baby. Where to find more information: [employer]: www.llli.org Contact a health care provider if:  You feel like you want to stop breastfeeding or have become frustrated with breastfeeding.  Your nipples are cracked or bleeding.  Your breasts are red, tender, or warm.  You have: ? Painful breasts or nipples. ? A swollen area on either breast. ? A fever or chills. ? Nausea or vomiting. ? Drainage other than breast milk from your nipples.  Your breasts do not become full before feedings by the fifth day after you give birth.  You feel sad and depressed.  Your baby is: ? Too sleepy to eat well. ? Having trouble sleeping. ? More than 36 week old and wetting fewer than 6 diapers in a 24-hour period. ? Not gaining weight by 30 days of age.  Your baby has fewer than 3 stools in a 24-hour period.  Your baby's skin or the white parts of his or her eyes become yellow. Get help right away if:  Your baby is overly tired (lethargic) and does not want to wake up and feed.  Your baby develops an unexplained fever. Summary  Breastfeeding offers many health benefits for infant and mothers.  Try to breastfeed your infant when he or she shows early signs of hunger.  Gently tickle or stroke your baby's lips with your finger or nipple to allow the baby to open his or her mouth. Bring the baby to your breast. Make sure that much of the areola is in your baby's mouth. Offer one side and burp the baby before you offer the other side.  Talk with your health care provider or lactation consultant if you have questions or you face problems as you breastfeed. This information is not intended to replace advice given to you by your health care provider. Make sure you discuss any questions you have with your health care provider. Document Released: 03/30/2005 Document  Revised: 05/01/2016 Document Reviewed: 05/01/2016 Elsevier Interactive Patient Education  2018 [employer]. Pain Relief During Labor and Delivery Many things can cause pain during labor and delivery, including:  Pressure on bones and ligaments due to the baby moving through the  pelvis.  Stretching of tissues due to the baby moving through the birth canal.  Muscle tension due to anxiety or nervousness.  The uterus tightening (contracting) and relaxing to help move the baby.  There are many ways to deal with the pain of labor and delivery. They include:  Taking prenatal classes. Taking these classes helps you know what to expect during your baby's birth. What you learn will increase your confidence and decrease your anxiety.  Practicing relaxation techniques or doing relaxing activities, such as: ? Focused breathing. ? Meditation. ? Visualization. ? Aroma therapy. ? Listening to your favorite music. ? Hypnosis.  Taking a warm shower or bath (hydrotherapy). This may: ? Provide comfort and relaxation. ? Lessen your perception of pain. ? Decrease the amount of pain medicine needed. ? Decrease the length of labor.  Getting a massage or counterpressure on your back.  Applying warm packs or ice packs.  Changing positions often, moving around, or using a birthing ball.  Getting: ? Pain medicine through an IV or injection into a muscle. ? Pain medicine inserted into your spinal column. ? Injections of sterile water just under the skin on your lower back (intradermal injections). ? Laughing gas (nitrous oxide).  Discuss your pain control options with your health care provider during your prenatal visits. Explore the options offered by your hospital or birth center. What kinds of medicine are available? There are two kinds of medicines that can be used to relieve pain during labor and delivery:  Analgesics. These medicines decrease pain without causing you to lose feeling or the  ability to move your muscles.  Anesthetics. These medicines block feeling in the body and can decrease your ability to move freely.  Both of these kinds of medicine can cause minor side effects, such as nausea, trouble concentrating, and sleepiness. They can also decrease the baby's heart rate before birth and affect the baby's breathing rate after birth. For this reason, health care providers are careful about when and how much medicine is given. What are specific medicines and procedures that provide pain relief? Local Anesthetics Local anesthetics are used to numb a small area of the body. They may be used along with another kind of anesthetic or used to numb the nerves of the vagina, cervix, and perineum during the second stage of labor. General Anesthetics General anesthetics cause you to lose consciousness so you do not feel pain. They are usually only used for an emergency cesarean delivery. General anesthetics are given through an IV tube and a mask. Pudendal Block A pudendal block is a form of local anesthetic. It may be used to relieve the pain associated with pushing or stretching of the perineum at the time of delivery or to further numb the perineum. A pudendal block is done by injecting numbing medicine through the vaginal wall into a nerve in the pelvis. Epidural Analgesia Epidural analgesia is given through a flexible IV catheter that is inserted into the lower back. Numbing medicine is delivered continuously to the area near your spinal column nerves (epidural space). After having this type of analgesia, you may be able to move your legs but you most likely will not be able to walk. Depending on the amount of medicine given, you may lose all feeling in the lower half of your body, or you may retain some level of sensation, including the urge to push. Epidural analgesia can be used to provide pain relief for a vaginal birth. Spinal Block A spinal block is  similar to epidural analgesia,  but the medicine is injected into the spinal fluid instead of the epidural space. A spinal block is only given once. It starts to relieve pain quickly, but the pain relief lasts only 1-6 hours. Spinal blocks can be used for cesarean deliveries. Combined Spinal-Epidural (CSE) Block A CSE block combines the effects of a spinal block and epidural analgesia. The spinal block works quickly to block all pain. The epidural analgesia provides continuous pain relief, even after the effects of the spinal block have worn off. This information is not intended to replace advice given to you by your health care provider. Make sure you discuss any questions you have with your health care provider. Document Released: 07/16/2008 Document Revised: 09/06/2015 Document Reviewed: 08/21/2015 Elsevier Interactive Patient Education  2018 [employer] Third Trimester of Pregnancy The third trimester is from week 29 through week 42, months 7 through 9. This trimester is when your unborn baby (fetus) is growing very fast. At the end of the ninth month, the unborn baby is about 20 inches in length. It weighs about 6-10 pounds. Follow these instructions at home:  Avoid all smoking, herbs, and alcohol. Avoid drugs not approved by your doctor.  Do not use any tobacco products, including cigarettes, chewing tobacco, and electronic cigarettes. If you need help quitting, ask your doctor. You may get counseling or other support to help you quit.  Only take medicine as told by your doctor. Some medicines are safe and some are not during pregnancy.  Exercise only as told by your doctor. Stop exercising if you start having cramps.  Eat regular, healthy meals.  Wear a good support bra if your breasts are tender.  Do not use hot tubs, steam rooms, or saunas.  Wear your seat belt when driving.  Avoid raw meat, uncooked cheese, and liter boxes and soil used by cats.  Take your prenatal vitamins.  Take 1500-2000 milligrams of  calcium daily starting at the 20th week of pregnancy until you deliver your baby.  Try taking medicine that helps you poop (stool softener) as needed, and if your doctor approves. Eat more fiber by eating fresh fruit, vegetables, and whole grains. Drink enough fluids to keep your pee (urine) clear or pale yellow.  Take warm water baths (sitz baths) to soothe pain or discomfort caused by hemorrhoids. Use hemorrhoid cream if your doctor approves.  If you have puffy, bulging veins (varicose veins), wear support hose. Raise (elevate) your feet for 15 minutes, 3-4 times a day. Limit salt in your diet.  Avoid heavy lifting, wear low heels, and sit up straight.  Rest with your legs raised if you have leg cramps or low back pain.  Visit your dentist if you have not gone during your pregnancy. Use a soft toothbrush to brush your teeth. Be gentle when you floss.  You can have sex (intercourse) unless your doctor tells you not to.  Do not travel far distances unless you must. Only do so with your doctor's approval.  Take prenatal classes.  Practice driving to the hospital.  Pack your hospital bag.  Prepare the baby's room.  Go to your doctor visits. Get help if:  You are not sure if you are in labor or if your water has broken.  You are dizzy.  You have mild cramps or pressure in your lower belly (abdominal).  You have a nagging pain in your belly area.  You continue to feel sick to your stomach (nauseous), throw up (  vomit), or have watery poop (diarrhea).  You have bad smelling fluid coming from your vagina.  You have pain with peeing (urination). Get help right away if:  You have a fever.  You are leaking fluid from your vagina.  You are spotting or bleeding from your vagina.  You have severe belly cramping or pain.  You lose or gain weight rapidly.  You have trouble catching your breath and have chest pain.  You notice sudden or extreme puffiness (swelling) of your  face, hands, ankles, feet, or legs.  You have not felt the baby move in over an hour.  You have severe headaches that do not go away with medicine.  You have vision changes. This information is not intended to replace advice given to you by your health care provider. Make sure you discuss any questions you have with your health care provider. Document Released: 06/24/2009 Document Revised: 09/05/2015 Document Reviewed: 05/31/2012 Elsevier Interactive Patient Education  2017 [employer].

## 2017-06-30 ENCOUNTER — Other Ambulatory Visit: Payer: Medicaid Other

## 2017-07-01 LAB — CBC
HEMATOCRIT: 33.7 % — AB (ref 34.0–46.6)
Hemoglobin: 10.9 g/dL — ABNORMAL LOW (ref 11.1–15.9)
MCH: 32.8 pg (ref 26.6–33.0)
MCHC: 32.3 g/dL (ref 31.5–35.7)
MCV: 102 fL — ABNORMAL HIGH (ref 79–97)
Platelets: 203 10*3/uL (ref 150–379)
RBC: 3.32 x10E6/uL — ABNORMAL LOW (ref 3.77–5.28)
RDW: 12.9 % (ref 12.3–15.4)
WBC: 12.2 10*3/uL — ABNORMAL HIGH (ref 3.4–10.8)

## 2017-07-01 LAB — GLUCOSE, 1 HOUR GESTATIONAL: Gestational Diabetes Screen: 82 mg/dL (ref 65–139)

## 2017-07-01 LAB — RPR: RPR Ser Ql: NONREACTIVE

## 2017-07-15 ENCOUNTER — Inpatient Hospital Stay
Admission: EM | Admit: 2017-07-15 | Discharge: 2017-07-16 | DRG: 832 | Disposition: A | Discharge status: Home / Self Care | Payer: Medicaid Other | Attending: Certified Nurse Midwife | Admitting: Certified Nurse Midwife

## 2017-07-15 ENCOUNTER — Other Ambulatory Visit: Payer: Self-pay

## 2017-07-15 ENCOUNTER — Encounter: Payer: Self-pay | Admitting: Certified Nurse Midwife

## 2017-07-15 DIAGNOSIS — O99323 Drug use complicating pregnancy, third trimester: Secondary | ICD-10-CM | POA: Diagnosis present

## 2017-07-15 DIAGNOSIS — Z8249 Family history of ischemic heart disease and other diseases of the circulatory system: Secondary | ICD-10-CM | POA: Diagnosis not present

## 2017-07-15 DIAGNOSIS — F17210 Nicotine dependence, cigarettes, uncomplicated: Secondary | ICD-10-CM | POA: Diagnosis present

## 2017-07-15 DIAGNOSIS — Z8349 Family history of other endocrine, nutritional and metabolic diseases: Secondary | ICD-10-CM

## 2017-07-15 DIAGNOSIS — Z803 Family history of malignant neoplasm of breast: Secondary | ICD-10-CM

## 2017-07-15 DIAGNOSIS — F1290 Cannabis use, unspecified, uncomplicated: Secondary | ICD-10-CM

## 2017-07-15 DIAGNOSIS — Z3A31 31 weeks gestation of pregnancy: Secondary | ICD-10-CM

## 2017-07-15 DIAGNOSIS — O99333 Smoking (tobacco) complicating pregnancy, third trimester: Secondary | ICD-10-CM | POA: Diagnosis present

## 2017-07-15 DIAGNOSIS — Z833 Family history of diabetes mellitus: Secondary | ICD-10-CM

## 2017-07-15 LAB — URINALYSIS, COMPLETE (UACMP) WITH MICROSCOPIC
Bacteria, UA: NONE SEEN
Bilirubin Urine: NEGATIVE
GLUCOSE, UA: NEGATIVE mg/dL
Ketones, ur: NEGATIVE mg/dL
LEUKOCYTES UA: NEGATIVE
Nitrite: NEGATIVE
PH: 6 (ref 5.0–8.0)
Protein, ur: 30 mg/dL — AB
SPECIFIC GRAVITY, URINE: 1.019 (ref 1.005–1.030)

## 2017-07-15 LAB — TYPE AND SCREEN
ABO/RH(D): AB POS
ANTIBODY SCREEN: NEGATIVE

## 2017-07-15 LAB — COMPREHENSIVE METABOLIC PANEL
ALBUMIN: 3.5 g/dL (ref 3.5–5.0)
ALK PHOS: 77 U/L (ref 38–126)
ALT: 11 U/L — ABNORMAL LOW (ref 14–54)
ANION GAP: 9 (ref 5–15)
AST: 17 U/L (ref 15–41)
BUN: 7 mg/dL (ref 6–20)
CALCIUM: 8.5 mg/dL — AB (ref 8.9–10.3)
CO2: 21 mmol/L — AB (ref 22–32)
Chloride: 108 mmol/L (ref 101–111)
Creatinine, Ser: 0.4 mg/dL — ABNORMAL LOW (ref 0.44–1.00)
GFR calc Af Amer: >60 mL/min (ref >60)
GFR calc non Af Amer: >60 mL/min (ref >60)
GLUCOSE: 91 mg/dL (ref 65–99)
Potassium: 3.2 mmol/L — ABNORMAL LOW (ref 3.5–5.1)
SODIUM: 138 mmol/L (ref 135–145)
Total Bilirubin: 0.4 mg/dL (ref 0.3–1.2)
Total Protein: 6.8 g/dL (ref 6.5–8.1)

## 2017-07-15 LAB — CBC
HCT: 32.6 % — ABNORMAL LOW (ref 35.0–47.0)
Hemoglobin: 11.2 g/dL — ABNORMAL LOW (ref 12.0–16.0)
MCH: 33.8 pg (ref 26.0–34.0)
MCHC: 34.3 g/dL (ref 32.0–36.0)
MCV: 98.5 fL (ref 80.0–100.0)
PLATELETS: 216 10*3/uL (ref 150–440)
RBC: 3.31 MIL/uL — AB (ref 3.80–5.20)
RDW: 12.9 % (ref 11.5–14.5)
WBC: 16.2 10*3/uL — AB (ref 3.6–11.0)

## 2017-07-15 LAB — URINE DRUG SCREEN, QUALITATIVE (ARMC ONLY)
AMPHETAMINES, UR SCREEN: NOT DETECTED
Barbiturates, Ur Screen: NOT DETECTED
Benzodiazepine, Ur Scrn: NOT DETECTED
CANNABINOID 50 NG, UR ~~LOC~~: POSITIVE — AB
Cocaine Metabolite,Ur ~~LOC~~: NOT DETECTED
MDMA (ECSTASY) UR SCREEN: NOT DETECTED
METHADONE SCREEN, URINE: NOT DETECTED
Opiate, Ur Screen: NOT DETECTED
Phencyclidine (PCP) Ur S: NOT DETECTED
TRICYCLIC, UR SCREEN: NOT DETECTED

## 2017-07-15 MED ORDER — ZOLPIDEM TARTRATE 5 MG PO TABS
5.0000 mg | ORAL_TABLET | Freq: Every evening | ORAL | Status: DC | PRN
  Administered 2017-07-15: 5 mg via ORAL
  Filled 2017-07-15: qty 1

## 2017-07-15 MED ORDER — TERBUTALINE SULFATE 1 MG/ML IJ SOLN
0.2500 mg | Freq: Once | INTRAMUSCULAR | Status: AC | PRN
  Administered 2017-07-15: 0.25 mg via SUBCUTANEOUS
  Filled 2017-07-15: qty 1

## 2017-07-15 MED ORDER — MAGNESIUM SULFATE 40 G IN LACTATED RINGERS - SIMPLE
2.0000 g/h | INTRAVENOUS | Status: DC
  Administered 2017-07-15: 1 g/h via INTRAVENOUS
  Administered 2017-07-16: 2 g/h via INTRAVENOUS
  Filled 2017-07-15 (×3): qty 500

## 2017-07-15 MED ORDER — MAGNESIUM SULFATE BOLUS VIA INFUSION
4.0000 g | Freq: Once | INTRAVENOUS | Status: AC
  Administered 2017-07-15: 4 g via INTRAVENOUS
  Filled 2017-07-15: qty 500

## 2017-07-15 MED ORDER — ONDANSETRON HCL 4 MG/2ML IJ SOLN
4.0000 mg | Freq: Four times a day (QID) | INTRAMUSCULAR | Status: DC | PRN
  Administered 2017-07-15 – 2017-07-16 (×3): 4 mg via INTRAVENOUS
  Filled 2017-07-15 (×3): qty 2

## 2017-07-15 MED ORDER — BETAMETHASONE SOD PHOS & ACET 6 (3-3) MG/ML IJ SUSP
12.0000 mg | INTRAMUSCULAR | Status: AC
  Administered 2017-07-15 – 2017-07-16 (×2): 12 mg via INTRAMUSCULAR

## 2017-07-15 MED ORDER — PRENATAL MULTIVITAMIN CH
1.0000 | ORAL_TABLET | Freq: Every day | ORAL | Status: DC
  Administered 2017-07-15: 1 via ORAL
  Filled 2017-07-15: qty 1

## 2017-07-15 MED ORDER — CALCIUM CARBONATE ANTACID 500 MG PO CHEW: 2.0000 | CHEWABLE_TABLET | ORAL | Status: DC | PRN

## 2017-07-15 MED ORDER — ACETAMINOPHEN 325 MG PO TABS
650.0000 mg | ORAL_TABLET | ORAL | Status: DC | PRN
Start: 2017-07-15 — End: 2017-07-16

## 2017-07-15 MED ORDER — DOCUSATE SODIUM 100 MG PO CAPS
100.0000 mg | ORAL_CAPSULE | Freq: Every day | ORAL | Status: DC
  Administered 2017-07-15: 100 mg via ORAL
  Filled 2017-07-15: qty 1

## 2017-07-15 MED ORDER — LACTATED RINGERS IV SOLN
INTRAVENOUS | Status: DC
  Administered 2017-07-15 (×2): via INTRAVENOUS
  Administered 2017-07-15: 1000 mL via INTRAVENOUS
  Administered 2017-07-16: 04:00:00 via INTRAVENOUS

## 2017-07-15 NOTE — Progress Notes (Addendum)
CNM at bedside to see pt. Admits she noticed bright red bleeding like enough to fill panty liner, explained to pt what could be causing bleeding.   0228 - SSE per CNM, moderate Pink tinged bleeding noted.  [id number] - SVE per CNM, cervix 2-3

## 2017-07-15 NOTE — Progress Notes (Signed)
Patient ID: [name], female   DOB: February 01, 1996, 22 y.o.   MRN: [id number]  [name] is a 22 y.o. G1P0000 at 40w4dwho is admitted for Preterm labor.  Estimated Date of Delivery: 09/12/17. Fetal presentation is cephalic.  Length of Stay:  0 Days. Admitted 07/15/2017  Subjective:  Patient sitting in bed, drinking milkshake. Feels "warm and flushed" with magnesium infusion.   Patient reports good fetal movement.  She reports no uterine contractions, no bleeding and no loss of fluid per vagina.   Objective:  Vitals:  Temp:  [97.5 F (36.4 C)-98.3 F (36.8 C)] 98.2 F (36.8 C) (04/04 1300) Pulse Rate:  [69-111] 89 (04/04 1659) Resp:  [16-18] 18 (04/04 1400) BP: (101-150)/(53-67) 113/67 (04/04 1659) SpO2:  [93 %-100 %] 97 % (04/04 0710) Weight:  [107 lb (48.5 kg)] 107 lb (48.5 kg) (04/04 0150)   Physical Examination:  CONSTITUTIONAL: Well-developed, well-nourished female in no acute distress.   SKIN: Skin is warm, dry, flushed. No rash noted. Not diaphoretic. No pallor.  [place] Alert and oriented to person, place, and time. Normal reflexes, muscle tone coordination. No cranial nerve deficit noted.  PSYCHIATRIC: Normal mood and affect. Normal behavior. Normal judgment and thought content.  MUSCULOSKELETAL: Normal range of motion. No edema and no tenderness. 2+ distal pulses.  CERVIX: Deferred, no complaints  Fetal monitoring: FHR: 125 bpm, Variability: moderate, Accelerations: Present, Decelerations: Absent   Uterine activity: Occasional contractions, soft resting tone  Results for orders placed or performed during the hospital encounter of 07/15/17 (from the past 48 hour(s))  CBC on admission     Status: Abnormal   Collection Time: 07/15/17  3:36 AM  Result Value Ref Range   WBC 16.2 (H) 3.6 - 11.0 K/uL   RBC 3.31 (L) 3.80 - 5.20 MIL/uL   Hemoglobin 11.2 (L) 12.0 - 16.0 g/dL   HCT 32.6 (L) 35.0 - 47.0 %   MCV 98.5 80.0 - 100.0 fL   MCH 33.8 26.0 - 34.0  pg   MCHC 34.3 32.0 - 36.0 g/dL   RDW 12.9 11.5 - 14.5 %   Platelets 216 150 - 440 K/uL    Comment: Performed at [facility] [address] [place] [address] Urine Drug Screen, Qualitative ([place])     Status: Abnormal   Collection Time: 07/15/17  3:36 AM  Result Value Ref Range   Tricyclic, Ur Screen NONE DETECTED NONE DETECTED   Amphetamines, Ur Screen NONE DETECTED NONE DETECTED   MDMA (Ecstasy)Ur Screen NONE DETECTED NONE DETECTED   Cocaine Metabolite,Ur [place] NONE DETECTED NONE DETECTED   Opiate, Ur Screen NONE DETECTED NONE DETECTED   Phencyclidine (PCP) Ur S NONE DETECTED NONE DETECTED   Cannabinoid 50 Ng, Ur [employer] [place] POSITIVE (A) NONE DETECTED   Barbiturates, Ur Screen NONE DETECTED NONE DETECTED   Benzodiazepine, Ur Scrn NONE DETECTED NONE DETECTED   Methadone Scn, Ur NONE DETECTED NONE DETECTED    Comment: (NOTE) Tricyclics + metabolites, urine    Cutoff 1000 ng/mL Amphetamines + metabolites, urine  Cutoff 1000 ng/mL MDMA (Ecstasy), urine              Cutoff 500 ng/mL Cocaine Metabolite, urine          Cutoff 300 ng/mL Opiate + metabolites, urine        Cutoff 300 ng/mL Phencyclidine (PCP), urine         Cutoff 25 ng/mL Cannabinoid, urine  Cutoff 50 ng/mL Barbiturates + metabolites, urine  Cutoff 200 ng/mL Benzodiazepine, urine              Cutoff 200 ng/mL Methadone, urine                   Cutoff 300 ng/mL The urine drug screen provides only a preliminary, unconfirmed analytical test result and should not be used for non-medical purposes. Clinical consideration and professional judgment should be applied to any positive drug screen result due to possible interfering substances. A more specific alternate chemical method must be used in order to obtain a confirmed analytical result. Gas chromatography / mass spectrometry (GC/MS) is the preferred confirmat ory method. Performed at [facility], [address]  [place], [place] [address]   Comprehensive metabolic panel     Status: Abnormal   Collection Time: 07/15/17  3:36 AM  Result Value Ref Range   Sodium 138 135 - 145 mmol/L   Potassium 3.2 (L) 3.5 - 5.1 mmol/L   Chloride 108 101 - 111 mmol/L   CO2 21 (L) 22 - 32 mmol/L   Glucose, Bld 91 65 - 99 mg/dL   BUN 7 6 - 20 mg/dL   Creatinine, Ser 0.40 (L) 0.44 - 1.00 mg/dL   Calcium 8.5 (L) 8.9 - 10.3 mg/dL   Total Protein 6.8 6.5 - 8.1 g/dL   Albumin 3.5 3.5 - 5.0 g/dL   AST 17 15 - 41 U/L   ALT 11 (L) 14 - 54 U/L   Alkaline Phosphatase 77 38 - 126 U/L   Total Bilirubin 0.4 0.3 - 1.2 mg/dL   GFR calc non Af Amer >60 >60 mL/min   GFR calc Af Amer >60 >60 mL/min    Comment: (NOTE) The eGFR has been calculated using the CKD EPI equation. This calculation has not been validated in all clinical situations. eGFR's persistently <60 mL/min signify possible Chronic Kidney Disease.    Anion gap 9 5 - 15    Comment: Performed at [facility], [address] [place], [place] [address]  Urinalysis, Complete w Microscopic     Status: Abnormal   Collection Time: 07/15/17  3:36 AM  Result Value Ref Range   Color, Urine YELLOW (A) YELLOW   APPearance CLEAR (A) CLEAR   Specific Gravity, Urine 1.019 1.005 - 1.030   pH 6.0 5.0 - 8.0   Glucose, UA NEGATIVE NEGATIVE mg/dL   Hgb urine dipstick LARGE (A) NEGATIVE   Bilirubin Urine NEGATIVE NEGATIVE   Ketones, ur NEGATIVE NEGATIVE mg/dL   Protein, ur 30 (A) NEGATIVE mg/dL   Nitrite NEGATIVE NEGATIVE   Leukocytes, UA NEGATIVE NEGATIVE   RBC / HPF TOO NUMEROUS TO COUNT 0 - 5 RBC/hpf   WBC, UA 0-5 0 - 5 WBC/hpf   Bacteria, UA NONE SEEN NONE SEEN   Squamous Epithelial / LPF 0-5 (A) NONE SEEN   Mucus PRESENT    Sperm, UA PRESENT     Comment: Performed at [facility], [address] [place], [place] [address]  Type and screen     Status: None   Collection Time: 07/15/17  5:33 AM  Result Value Ref Range   ABO/RH(D) AB POS    Antibody  Screen NEG    Sample Expiration      07/18/2017 Performed at [facility], [address] [place], [place] [address]     No results found.  Current scheduled medications . betamethasone acetate-betamethasone sodium phosphate  12  mg Intramuscular Q24H  . docusate sodium  100 mg Oral Daily  . prenatal multivitamin  1 tablet Oral Q1200    I have reviewed the patient's current medications.  ASSESSMENT:  Patient Active Problem List   Diagnosis Date Noted  . Preterm labor 07/15/2017  . MDD (major depressive disorder), single episode, moderate ([place]) 09/18/2016  . Self-inflicted laceration of wrist 09/17/2016  . Tobacco use disorder 09/17/2016  . Cannabis use disorder, moderate, dependence ([place]) 09/17/2016    PLAN:  Plan of care reviewed with patient and family members, verbalized understanding.   Reviewed red flag symptoms and when to call.   Continue orders as written. Reassess as needed.    Diona Fanti, CNM [facility], [facility]

## 2017-07-15 NOTE — H&P (Signed)
Nikki Glanzer is a 22 y.o. G1P0000 at 13w4dwho is admitted for Preterm labor.  Estimated Date of Delivery: 09/12/17. Fetal presentation is cephalic by vaginal exam.   Length of Stay:  0 Days. Admitted 07/15/2017  SUBJECTIVE:  Patient came in this evening after noticing bright red vaginal bleeding, enough to fill a panty liner after intercourse this morning and intermittent lower abdominal cramping.   Patient reports good fetal movement.  She reports no uterine contractions and no loss of fluid per vagina.  Denies difficulty breathing or respiratory distress, chest pain, dysuria, and leg pain or swelling.   OB History    Gravida  1   Para  0   Term  0   Preterm  0   AB  0   Living  0     SAB  0   TAB  0   Ectopic  0   Multiple  0   Live Births             Past Medical History:  Diagnosis Date  . Abnormal uterine bleeding    Past Surgical History:  Procedure Laterality Date  . ORTHOPEDIC SURGERY Right arm    Social History   Socioeconomic History  . Marital status: Single    Spouse name: Not on file  . Number of children: Not on file  . Years of education: Not on file  . Highest education level: Not on file  Occupational History  . Not on file  Social Needs  . Financial resource strain: Not on file  . Food insecurity:    Worry: Not on file    Inability: Not on file  . Transportation needs:    Medical: Not on file    Non-medical: Not on file  Tobacco Use  . Smoking status: Light Tobacco Smoker    Packs/day: 0.10    Types: Cigarettes    Last attempt to quit: 01/11/2017    Years since quitting: 0.5  . Smokeless tobacco: Never Used  . Tobacco comment: quit about 2 wks ago, at 29-30wk of pregnancy  Substance and Sexual Activity  . Alcohol use: Yes  . Drug use: No  . Sexual activity: Yes    Birth control/protection: None  Lifestyle  . Physical activity:    Days per week: Not on file    Minutes per session: Not on file  . Stress: Not on file   Relationships  . Social connections:    Talks on phone: Not on file    Gets together: Not on file    Attends religious service: Not on file    Active member of club or organization: Not on file    Attends meetings of clubs or organizations: Not on file    Relationship status: Not on file  . Intimate partner violence:    Fear of current or ex partner: Not on file    Emotionally abused: Not on file    Physically abused: Not on file    Forced sexual activity: Not on file  Other Topics Concern  . Not on file  Social History Narrative  . Not on file    Family History  Problem Relation Age of Onset  . Diabetes Mother   . Hypertension Mother   . Hyperlipidemia Father   . Hypertension Father   . Migraines Sister   . Seizures Sister   . Breast cancer Maternal Grandmother   . Cancer Maternal Grandfather      OBJECTIVE:  Vitals:  Temp:  [98.3 F (36.8 C)] 98.3 F (36.8 C) (04/04 0150) Pulse Rate:  [95] 95 (04/04 0150) Resp:  [18] 18 (04/04 0150) BP: (103)/(66) 103/66 (04/04 0150) Weight:  [107 lb (48.5 kg)] 107 lb (48.5 kg) (04/04 0150)  Physical Examination:  CONSTITUTIONAL: Well-developed, well-nourished female in no acute distress.   SKIN: Skin is warm and dry. No rash noted. Not diaphoretic. No erythema. No pallor.  [place]: Alert and oriented to person, place, and time. Normal reflexes, muscle tone coordination. No cranial nerve deficit noted.  PSYCHIATRIC: Normal mood and affect. Normal behavior. Normal judgment and thought content.  CARDIOVASCULAR: Normal heart rate noted, regular rhythm  RESPIRATORY: Effort and breath sounds normal, no problems with respiration noted  MUSCULOSKELETAL: Normal range of motion. No edema and no tenderness. 2+ distal pulses.  ABDOMEN: Soft, nontender, nondistended, gravid.  CERVIX: Dilation: (P) 2.5 Presentation: (P) Vertex Exam by:: Mamie Nick) Diego Cory, CNM  Fetal monitoring: FHR: 125 bpm, Variability: moderate, Accelerations:  Present, Decelerations: Absent   Contractions: Every five (5) to seven (7) minutes, soft resting tone  Results for orders placed or performed during the hospital encounter of 07/15/17 (from the past 48 hour(s))  Type and screen [place]     Status: None (Preliminary result)   Collection Time: 07/15/17  3:36 AM  Result Value Ref Range   ABO/RH(D) PENDING    Antibody Screen PENDING    Sample Expiration      07/18/2017 Performed at [facility], [address] [place], [place] [address]   CBC on admission     Status: Abnormal   Collection Time: 07/15/17  3:36 AM  Result Value Ref Range   WBC 16.2 (H) 3.6 - 11.0 K/uL   RBC 3.31 (L) 3.80 - 5.20 MIL/uL   Hemoglobin 11.2 (L) 12.0 - 16.0 g/dL   HCT 32.6 (L) 35.0 - 47.0 %   MCV 98.5 80.0 - 100.0 fL   MCH 33.8 26.0 - 34.0 pg   MCHC 34.3 32.0 - 36.0 g/dL   RDW 12.9 11.5 - 14.5 %   Platelets 216 150 - 440 K/uL    Comment: Performed at [facility], [address] [place], [place] [address]  Urine Drug Screen, Qualitative ([place] only)     Status: Abnormal   Collection Time: 07/15/17  3:36 AM  Result Value Ref Range   Tricyclic, Ur Screen NONE DETECTED NONE DETECTED   Amphetamines, Ur Screen NONE DETECTED NONE DETECTED   MDMA (Ecstasy)Ur Screen NONE DETECTED NONE DETECTED   Cocaine Metabolite,Ur [place] NONE DETECTED NONE DETECTED   Opiate, Ur Screen NONE DETECTED NONE DETECTED   Phencyclidine (PCP) Ur S NONE DETECTED NONE DETECTED   Cannabinoid 50 Ng, Ur [place] POSITIVE (A) NONE DETECTED   Barbiturates, Ur Screen NONE DETECTED NONE DETECTED   Benzodiazepine, Ur Scrn NONE DETECTED NONE DETECTED   Methadone Scn, Ur NONE DETECTED NONE DETECTED    Comment: (NOTE) Tricyclics + metabolites, urine    Cutoff 1000 ng/mL Amphetamines + metabolites, urine  Cutoff 1000 ng/mL MDMA (Ecstasy), urine              Cutoff 500 ng/mL Cocaine Metabolite, urine          Cutoff 300 ng/mL Opiate + metabolites, urine         Cutoff 300 ng/mL Phencyclidine (PCP), urine         Cutoff 25 ng/mL Cannabinoid, urine  Cutoff 50 ng/mL Barbiturates + metabolites, urine  Cutoff 200 ng/mL Benzodiazepine, urine              Cutoff 200 ng/mL Methadone, urine                   Cutoff 300 ng/mL The urine drug screen provides only a preliminary, unconfirmed analytical test result and should not be used for non-medical purposes. Clinical consideration and professional judgment should be applied to any positive drug screen result due to possible interfering substances. A more specific alternate chemical method must be used in order to obtain a confirmed analytical result. Gas chromatography / mass spectrometry (GC/MS) is the preferred confirmat ory method. Performed at [facility], [address] [place], [place] [address]   Comprehensive metabolic panel     Status: Abnormal   Collection Time: 07/15/17  3:36 AM  Result Value Ref Range   Sodium 138 135 - 145 mmol/L   Potassium 3.2 (L) 3.5 - 5.1 mmol/L   Chloride 108 101 - 111 mmol/L   CO2 21 (L) 22 - 32 mmol/L   Glucose, Bld 91 65 - 99 mg/dL   BUN 7 6 - 20 mg/dL   Creatinine, Ser 0.40 (L) 0.44 - 1.00 mg/dL   Calcium 8.5 (L) 8.9 - 10.3 mg/dL   Total Protein 6.8 6.5 - 8.1 g/dL   Albumin 3.5 3.5 - 5.0 g/dL   AST 17 15 - 41 U/L   ALT 11 (L) 14 - 54 U/L   Alkaline Phosphatase 77 38 - 126 U/L   Total Bilirubin 0.4 0.3 - 1.2 mg/dL   GFR calc non Af Amer >60 >60 mL/min   GFR calc Af Amer >60 >60 mL/min    Comment: (NOTE) The eGFR has been calculated using the CKD EPI equation. This calculation has not been validated in all clinical situations. eGFR's persistently <60 mL/min signify possible Chronic Kidney Disease.    Anion gap 9 5 - 15    Comment: Performed at [facility], [address] [place], [place] [address]  Urinalysis, Complete w Microscopic     Status: Abnormal   Collection Time: 07/15/17  3:36 AM  Result Value Ref  Range   Color, Urine YELLOW (A) YELLOW   APPearance CLEAR (A) CLEAR   Specific Gravity, Urine 1.019 1.005 - 1.030   pH 6.0 5.0 - 8.0   Glucose, UA NEGATIVE NEGATIVE mg/dL   Hgb urine dipstick LARGE (A) NEGATIVE   Bilirubin Urine NEGATIVE NEGATIVE   Ketones, ur NEGATIVE NEGATIVE mg/dL   Protein, ur 30 (A) NEGATIVE mg/dL   Nitrite NEGATIVE NEGATIVE   Leukocytes, UA NEGATIVE NEGATIVE   RBC / HPF TOO NUMEROUS TO COUNT 0 - 5 RBC/hpf   WBC, UA 0-5 0 - 5 WBC/hpf   Bacteria, UA NONE SEEN NONE SEEN   Squamous Epithelial / LPF 0-5 (A) NONE SEEN   Mucus PRESENT    Sperm, UA PRESENT     Comment: Performed at [facility], [address] [place], [place] [address]    No results found.  Current scheduled medications . betamethasone acetate-betamethasone sodium phosphate  12 mg Intramuscular Q24H  . docusate sodium  100 mg Oral Daily  . magnesium  4 g Intravenous Once  . prenatal multivitamin  1 tablet Oral Q1200    I have reviewed the patient's current medications.  ASSESSMENT: Patient Active Problem List   Diagnosis Date Noted  . Preterm labor 07/15/2017  . MDD (major  depressive disorder), single episode, moderate ([place]) 09/18/2016  . Self-inflicted laceration of wrist 09/17/2016  . Tobacco use disorder 09/17/2016  . Cannabis use disorder, moderate, dependence ([place]) 09/17/2016    PLAN:  Admit to birth suites.   Terbutaline x 1 dose now. Betamethasone x 1 dose now. Second dose in 24 hours. Magnesium for Neuroprotection, see orders.   Neonatology and MFM consults, see orders.   Plan of care reviewed with patient and significant other. Questions answered. Verbalized understanding.   Reviewed red flag symptoms and when to call.   Dr. Marcelline Mates consulted regarding admission and plan of care.    Diona Fanti, CNM [facility], [facility]

## 2017-07-15 NOTE — Progress Notes (Signed)
Christy RosenthalM. Lawhorn, CNM at nurses station, made aware of pt arrival with c/o vaginal bleeding like a period after intercourse, no lubricant used per pt.

## 2017-07-15 NOTE — OB Triage Note (Signed)
Pt says she lost balance and only hit her ankle about 2 wks ago, says she did not hit abdomen. Has next appt with OB tomorrow.

## 2017-07-15 NOTE — Progress Notes (Signed)
Pt presents to BP from home with c/o heavy bleeding after intercourse around 0000, felt like the first day of normal period, not enough to saturate a peripad. Says bleeding stopped shortly after she noticed it and she put on clothes and came to the hospital. Confirms +FM. Denies any leaking or gush of fluid like water was broke, no nausea/vomiting, urinary sym, or unusual discharge.

## 2017-07-16 DIAGNOSIS — F1290 Cannabis use, unspecified, uncomplicated: Secondary | ICD-10-CM

## 2017-07-16 DIAGNOSIS — Z3A31 31 weeks gestation of pregnancy: Secondary | ICD-10-CM

## 2017-07-16 DIAGNOSIS — O99323 Drug use complicating pregnancy, third trimester: Secondary | ICD-10-CM

## 2017-07-16 LAB — URINE CULTURE: CULTURE: NO GROWTH

## 2017-07-16 NOTE — Discharge Summary (Signed)
Antenatal Discharge Summary  Patient ID: [name] MRN: [id number] DOB/AGE: 22/06/1995 22 y.o.  Admit date: 07/15/2017 [name], CNM [facility](A. Cherry, MD)  Discharge date: 07/16/2017 [name], CNM [facility](A. Cherry, MD)  Admission Diagnoses: Preterm labor, Positive drug screen-marijuana  Discharge Diagnoses: Preterm labor, Positive drug screen-marijuana  Prenatal Procedures: ultrasound  Consults: Neonatology, Maternal Fetal Medicine  Significant Diagnostic Studies:  Results for orders placed or performed during the hospital encounter of 07/15/17 (from the past 168 hour(s))  Urine Culture   Collection Time: 07/15/17  3:36 AM  Result Value Ref Range   Specimen Description      URINE, CLEAN CATCH Performed at [facility], [address] [place], [place] [address]    Special Requests      NONE Performed at [facility], [address] [place], [place] [address]    Culture      NO GROWTH Performed at [facility], [address] [place], [place] [address]    Report Status 07/16/2017 FINAL   CBC on admission   Collection Time: 07/15/17  3:36 AM  Result Value Ref Range   WBC 16.2 (H) 3.6 - 11.0 K/uL   RBC 3.31 (L) 3.80 - 5.20 MIL/uL   Hemoglobin 11.2 (L) 12.0 - 16.0 g/dL   HCT 21.332.6 (L) 08.635.0 - 57.847.0 %   MCV 98.5 80.0 - 100.0 fL   MCH 33.8 26.0 - 34.0 pg   MCHC 34.3 32.0 - 36.0 g/dL   RDW 46.912.9 62.911.5 - 52.814.5 %   Platelets 216 150 - 440 K/uL  Urine Drug Screen, Qualitative ([facility] only)   Collection Time: 07/15/17  3:36 AM  Result Value Ref Range   Tricyclic, Ur Screen NONE DETECTED NONE DETECTED   Amphetamines, Ur Screen NONE DETECTED NONE DETECTED   MDMA (Ecstasy)Ur Screen NONE DETECTED NONE DETECTED   Cocaine Metabolite,Ur [place] NONE DETECTED NONE DETECTED   Opiate, Ur Screen NONE DETECTED NONE DETECTED   Phencyclidine (PCP) Ur S NONE DETECTED NONE DETECTED   Cannabinoid 50 Ng, Ur [place] POSITIVE (A) NONE DETECTED   Barbiturates, Ur Screen NONE  DETECTED NONE DETECTED   Benzodiazepine, Ur Scrn NONE DETECTED NONE DETECTED   Methadone Scn, Ur NONE DETECTED NONE DETECTED  Comprehensive metabolic panel   Collection Time: 07/15/17  3:36 AM  Result Value Ref Range   Sodium 138 135 - 145 mmol/L   Potassium 3.2 (L) 3.5 - 5.1 mmol/L   Chloride 108 101 - 111 mmol/L   CO2 21 (L) 22 - 32 mmol/L   Glucose, Bld 91 65 - 99 mg/dL   BUN 7 6 - 20 mg/dL   Creatinine, Ser 4.130.40 (L) 0.44 - 1.00 mg/dL   Calcium 8.5 (L) 8.9 - 10.3 mg/dL   Total Protein 6.8 6.5 - 8.1 g/dL   Albumin 3.5 3.5 - 5.0 g/dL   AST 17 15 - 41 U/L   ALT 11 (L) 14 - 54 U/L   Alkaline Phosphatase 77 38 - 126 U/L   Total Bilirubin 0.4 0.3 - 1.2 mg/dL   GFR calc non Af Amer >60 >60 mL/min   GFR calc Af Amer >60 >60 mL/min   Anion gap 9 5 - 15  Urinalysis, Complete w Microscopic   Collection Time: 07/15/17  3:36 AM  Result Value Ref Range   Color, Urine YELLOW (A) YELLOW   APPearance CLEAR (A) CLEAR   Specific Gravity, Urine 1.019 1.005 - 1.030   pH 6.0 5.0 - 8.0  Glucose, UA NEGATIVE NEGATIVE mg/dL   Hgb urine dipstick LARGE (A) NEGATIVE   Bilirubin Urine NEGATIVE NEGATIVE   Ketones, ur NEGATIVE NEGATIVE mg/dL   Protein, ur 30 (A) NEGATIVE mg/dL   Nitrite NEGATIVE NEGATIVE   Leukocytes, UA NEGATIVE NEGATIVE   RBC / HPF TOO NUMEROUS TO COUNT 0 - 5 RBC/hpf   WBC, UA 0-5 0 - 5 WBC/hpf   Bacteria, UA NONE SEEN NONE SEEN   Squamous Epithelial / LPF 0-5 (A) NONE SEEN   Mucus PRESENT    Sperm, UA PRESENT   Type and screen   Collection Time: 07/15/17  5:33 AM  Result Value Ref Range   ABO/RH(D) AB POS    Antibody Screen NEG    Sample Expiration      07/18/2017 Performed at [facility], [address] [place], [place] [address]     Treatments: IV hydration, Magnesium sulfate for neuroprotection, Betamethasone x 2 dose, Terbutaline x 1 dose  Hospital Course:   This is a 22 y.o. G1P0000 with IUP at [redacted]w[redacted]d admitted for vaginal bleeding, lower abdominal  cramping, and preterm labor.  She was admitted with contractions, noted to have a cervical exam of 2-3 cm. No leakage of fluid. She was initially started on magnesium sulfate for tocolysis and neuroprotection and also received betamethasone x 2 doses.  She was seen by Neonatology during her stay.  She was observed, fetal heart rate monitoring remained reassuring, and she had no signs/symptoms of progressing preterm labor or other maternal-fetal concerns. Her cervix was not reexamined prior to discharge.  She was deemed stable for discharge to home with outpatient follow up.  Discharge Exam:  Temp:  [97.6 F (36.4 C)-98.2 F (36.8 C)] 98.2 F (36.8 C) (04/05 0847) Pulse Rate:  [60-89] 71 (04/05 0847) Resp:  [16-20] 18 (04/05 0500) BP: (104-128)/(51-74) 116/58 (04/05 0847) SpO2:  [97 %-100 %] 98 % (04/05 1006)  General appearance: alert and cooperative Head: Normocephalic, without obvious abnormality, atraumatic Cardio: regular rate and rhythm, S1, S2 normal, no murmur, click, rub or gallop GI: soft, non-tender; bowel sounds normal; no masses,  no organomegaly and gravid Extremities: extremities normal, atraumatic, no cyanosis or edema Pulses: 2+ and symmetric Skin: Skin color, texture, turgor normal. No rashes or lesions  Discharge Condition: Stable  Disposition: Home   Allergies as of 07/16/2017   No Known Allergies     Medication List    TAKE these medications   Prenatal Vitamins 0.8 MG tablet Take 1 tablet by mouth daily.       Gunnar Bulla, CNM [facility], [facility]

## 2017-07-16 NOTE — Discharge Summary (Signed)
Removed patient's IV. Catheter clean, dry, and intact upon removal. Went over discharge instructions with that patient and signs and symptoms to notify the provider of or come in and get seen. RN gave opportunity for questions. All questions answered. Patient verbalized understanding and discharged home with her boyfriend.

## 2017-07-16 NOTE — Discharge Instructions (Signed)
Pelvic Rest Pelvic rest may be recommended if:  Your placenta is partially or completely covering the opening of your cervix (placenta previa).  There is bleeding between the wall of the uterus and the amniotic sac in the first trimester of pregnancy (subchorionic hemorrhage).  You went into labor too early (preterm labor).  Based on your overall health and the health of your baby, your health care provider will decide if pelvic rest is right for you. How do I rest my pelvis? For as long as told by your health care provider:  Do not have sex, sexual stimulation, or an orgasm.  Do not use tampons. Do not douche. Do not put anything in your vagina.  Do not lift anything that is heavier than 10 lb (4.5 kg).  Avoid activities that take a lot of effort (are strenuous).  Avoid any activity in which your pelvic muscles could become strained.  When should I seek medical care? Seek medical care if you have:  Cramping pain in your lower abdomen.  Vaginal discharge.  A low, dull backache.  Regular contractions.  Uterine tightening.  When should I seek immediate medical care? Seek immediate medical care if:  You have vaginal bleeding and you are pregnant.  This information is not intended to replace advice given to you by your health care provider. Make sure you discuss any questions you have with your health care provider. Document Released: 07/25/2010 Document Revised: 09/05/2015 Document Reviewed: 10/01/2014 Elsevier Interactive Patient Education  2018 [employer]. Preventing Preterm Birth Preterm birth is when your baby is delivered between 20 weeks and 37 weeks of pregnancy. A full-term pregnancy lasts for at least 37 weeks. Preterm birth can be dangerous for your baby because the last few weeks of pregnancy are an important time for your baby's brain and lungs to grow. Many things can cause a baby to be born early. Sometimes the cause is not known. There are certain factors  that make you more likely to experience preterm birth, such as:  Having a previous baby born preterm.  Being pregnant with twins or other multiples.  Having had fertility treatment.  Being overweight or underweight at the start of your pregnancy.  Having any of the following during pregnancy: ? An infection, including a urinary tract infection (UTI) or an STI (sexually transmitted infection). ? High blood pressure. ? Diabetes. ? Vaginal bleeding.  Being age 23 or older.  Being age 62 or younger.  Getting pregnant within 6 months of a previous pregnancy.  Suffering extreme stress or physical or emotional abuse during pregnancy.  Standing for long periods of time during pregnancy, such as working at a job that requires standing.  What are the risks? The most serious risk of preterm birth is that the baby may not survive. This is more likely to happen if a baby is born before 34 weeks. Other risks and complications of preterm birth may include your baby having:  Breathing problems.  Brain damage that affects movement and coordination (cerebral palsy).  Feeding difficulties.  Vision or hearing problems.  Infections or inflammation of the digestive tract (colitis).  Developmental delays.  Learning disabilities.  Higher risk for diabetes, heart disease, and high blood pressure later in life.  What can I do to lower my risk? Medical care  The most important thing you can do to lower your risk for preterm birth is to get routine medical care during pregnancy (prenatal care). If you have a high risk of preterm birth,  you may be referred to a health care provider who specializes in managing high-risk pregnancies (perinatologist). You may be given medicine to help prevent preterm birth. Lifestyle changes Certain lifestyle changes can also lower your risk of preterm birth:  Wait at least 6 months after a pregnancy to become pregnant again.  Try to plan pregnancy for when you  are between 65 and 56 years old.  Get to a healthy weight before getting pregnant. If you are overweight, work with your health care provider to safely lose weight.  Do not use any products that contain nicotine or tobacco, such as cigarettes and e-cigarettes. If you need help quitting, ask your health care provider.  Do not drink alcohol.  Do not use drugs.  Where to find support: For more support, consider:  Talking with your health care provider.  Talking with a therapist or substance abuse counselor, if you need help quitting.  Working with a diet and nutrition specialist (dietitian) or a Systems analyst to maintain a healthy weight.  Joining a support group.  Where to find more information: Learn more about preventing preterm birth from:  Centers for Disease Control and Prevention: http://curry.org/  March of Dimes: marchofdimes.org/complications/premature-babies.aspx  American Pregnancy Association: americanpregnancy.org/labor-and-birth/premature-labor  Contact a health care provider if:  You have any of the following signs of preterm labor before 37 weeks: ? A change or increase in vaginal discharge. ? Fluid leaking from your vagina. ? Pressure or cramps in your lower abdomen. ? A backache that does not go away or gets worse. ? Regular tightening (contractions) in your lower abdomen. Summary  Preterm birth means having your baby during weeks 20-37 of pregnancy.  Preterm birth may put your baby at risk for physical and mental problems.  Getting good prenatal care can help prevent preterm birth.  You can lower your risk of preterm birth by making certain lifestyle changes, such as not smoking and not using alcohol. This information is not intended to replace advice given to you by your health care provider. Make sure you discuss any questions you have with your health care provider. Document Released: 05/14/2015  Document Revised: 12/07/2015 Document Reviewed: 12/07/2015 Elsevier Interactive Patient Education  2018 [employer]. Preterm Labor and Birth Information Pregnancy normally lasts 39-41 weeks. Preterm labor is when labor starts early. It starts before you have been pregnant for 37 whole weeks. What are the risk factors for preterm labor? Preterm labor is more likely to occur in women who:  Have an infection while pregnant.  Have a cervix that is short.  Have gone into preterm labor before.  Have had surgery on their cervix.  Are younger than age 94.  Are older than age 76.  Are African American.  Are pregnant with two or more babies.  Take street drugs while pregnant.  Smoke while pregnant.  Do not gain enough weight while pregnant.  Got pregnant right after another pregnancy.  What are the symptoms of preterm labor? Symptoms of preterm labor include:  Cramps. The cramps may feel like the cramps some women get during their period. The cramps may happen with watery poop (diarrhea).  Pain in the belly (abdomen).  Pain in the lower back.  Regular contractions or tightening. It may feel like your belly is getting tighter.  Pressure in the lower belly that seems to get stronger.  More fluid (discharge) leaking from the vagina. The fluid may be watery or bloody.  Water breaking.  Why is it important to notice signs  of preterm labor? Babies who are born early may not be fully developed. They have a higher chance for:  Long-term heart problems.  Long-term lung problems.  Trouble controlling body systems, like breathing.  Bleeding in the brain.  A condition called cerebral palsy.  Learning difficulties.  Death.  These risks are highest for babies who are born before 34 weeks of pregnancy. How is preterm labor treated? Treatment depends on:  How long you were pregnant.  Your condition.  The health of your baby.  Treatment may involve:  Having a  stitch (suture) placed in your cervix. When you give birth, your cervix opens so the baby can come out. The stitch keeps the cervix from opening too soon.  Staying at the hospital.  Taking or getting medicines, such as: ? Hormone medicines. ? Medicines to stop contractions. ? Medicines to help the babys lungs develop. ? Medicines to prevent your baby from having cerebral palsy.  What should I do if I am in preterm labor? If you think you are going into labor too soon, call your doctor right away. How can I prevent preterm labor?  Do not use any tobacco products. ? Examples of these are cigarettes, chewing tobacco, and e-cigarettes. ? If you need help quitting, ask your doctor.  Do not use street drugs.  Do not use any medicines unless you ask your doctor if they are safe for you.  Talk with your doctor before taking any herbal supplements.  Make sure you gain enough weight.  Watch for infection. If you think you might have an infection, get it checked right away.  If you have gone into preterm labor before, tell your doctor. This information is not intended to replace advice given to you by your health care provider. Make sure you discuss any questions you have with your health care provider. Document Released: 06/26/2008 Document Revised: 09/10/2015 Document Reviewed: 08/21/2015 Elsevier Interactive Patient Education  2018 [employer]

## 2017-07-17 LAB — CULTURE, BETA STREP (GROUP B ONLY)

## 2017-07-20 ENCOUNTER — Other Ambulatory Visit: Payer: Self-pay | Admitting: Certified Nurse Midwife

## 2017-07-20 ENCOUNTER — Other Ambulatory Visit (INDEPENDENT_AMBULATORY_CARE_PROVIDER_SITE_OTHER): Payer: Medicaid Other

## 2017-07-20 ENCOUNTER — Ambulatory Visit (INDEPENDENT_AMBULATORY_CARE_PROVIDER_SITE_OTHER): Payer: Medicaid Other | Admitting: Certified Nurse Midwife

## 2017-07-20 VITALS — BP 108/54 | HR 92 | Wt 113.2 lb

## 2017-07-20 DIAGNOSIS — Z3403 Encounter for supervision of normal first pregnancy, third trimester: Secondary | ICD-10-CM

## 2017-07-20 DIAGNOSIS — O365930 Maternal care for other known or suspected poor fetal growth, third trimester, not applicable or unspecified: Secondary | ICD-10-CM | POA: Insufficient documentation

## 2017-07-20 LAB — POCT URINALYSIS DIPSTICK
BILIRUBIN UA: NEGATIVE
Blood, UA: NEGATIVE
GLUCOSE UA: NEGATIVE
Ketones, UA: NEGATIVE
LEUKOCYTES UA: NEGATIVE
Nitrite, UA: NEGATIVE
PH UA: 7.5 (ref 5.0–8.0)
Protein, UA: NEGATIVE
SPEC GRAV UA: 1.01 (ref 1.010–1.025)
UROBILINOGEN UA: 0.2 U/dL

## 2017-07-20 NOTE — Progress Notes (Signed)
Pt is here for an ED followup. States she had intercourse and started bleeding.

## 2017-07-20 NOTE — Patient Instructions (Addendum)
Intrauterine Growth Restriction Intrauterine growth restriction (IUGR) is when your baby is not growing normally during your pregnancy. A baby with IUGR is smaller than it should be and may weigh less than normal at birth. IUGR can result if there is a problem with the organ that supplies your baby with oxygen and nutrition (placenta). Usually, there is no way to prevent this type of problem. What are the causes? The most common cause of IUGR is a problem with the placenta or umbilical cord that causes your developing baby to get less oxygen or nutrition than needed. Other causes include:  Poor maternal nutrition.  Chemicals found in substances such as cigarettes, alcohol, and some illegal drugs.  Some prescription medicines.  Congenital defects.  Genetic disorders.  An infection.  Carrying more than one baby, such as twins or triplets (multiple gestations).  What increases the risk? This condition is more likely to develop in women who:  Are over the age of 35 at the time of delivery (advanced maternal age).  Have medical conditions such as high blood pressure, diabetes, heart or kidney disease, anemia, or conditions that increase the risk for blood clotting.  Live at a very high altitude during pregnancy.  Have a personal history or family history of IUGR.  Take medicines during pregnancy that are linked with congenital disabilities.  Have a personal or family history of a genetic disorder.  Come into contact with infected cat feces (toxoplasmosis).  Come into contact with chickenpox (varicella) or German measles (rubella).  Have or are at risk of getting an infectious disease such as syphilis, HIV, or herpes.  Eat poorly during their pregnancy.  Weigh less than 100 pounds.  Have a family history of multiple gestations.  Have had infertility treatments.  Use tobacco, illegal drugs, or drink alcohol during pregnancy.  What are the signs or symptoms? IUGR does not  cause many symptoms. You might notice that your baby does not move or kick very often. Also, your belly may not be as big as expected for the stage of your pregnancy. How is this diagnosed? This condition is diagnosed with physical and prenatal exams. Your health care provider will measure the size of your baby inside your womb during a routine screening exam using sound waves (ultrasound). Your health care provider will compare the size of your baby to the size of other babies at the same stage of development (gestational age). Your health care provider will diagnose IUGR if your baby is smaller than 90 percent of all other babies at the same gestational age. You may also have tests to find the cause of IUGR. These can include:  Having fluid removed from your womb to check for signs of infection or a congenital disability (amniocentesis).  A series of tests to monitor your baby's health (fetal monitoring).  How is this treated? In most cases, treatment for this condition focuses on stopping the cause of your baby's small growth. Your health care providers will monitor your pregnancy closely and help you manage your pregnancy. If this condition is caused by a placenta problem and the baby is not getting enough blood, treatment may include:  Medicine to start labor and deliver your baby early (induction).  Cesarean delivery. In this procedure, your baby is delivered through a cut (incision) in the abdomen and womb (uterus).  Follow these instructions at home:  Make sure you are eating enough calories and gaining enough weight.  Eat a balanced diet. Work with a nutrition specialist (  dietitian), if needed.  Rest as needed. Try to get at least eight hours of sleep every night.  Do not drink alcohol.  Do not use illegal drugs.  Do not use any tobacco products, including cigarettes, chewing tobacco, or e-cigarettes. If you need help quitting, ask your health care provider.  Talk to your  health care provider about steps you can take to avoid infections.  Take medicines, vitamins, and mineral supplements only as directed by your health care provider. Make sure that your health care provider knows about all of the prescribed or over-the-counter medicines, supplements, vitamins, eye drops, and creams that you are using.  Keep all follow-up visits as directed by your health care provider. This is important. Contact a health care provider if:  Your baby is not moving as often as before. This information is not intended to replace advice given to you by your health care provider. Make sure you discuss any questions you have with your health care provider. Document Released: 01/07/2008 Document Revised: 09/05/2015 Document Reviewed: 03/26/2014 Elsevier Interactive Patient Education  2018 [employer] Nonstress Test The nonstress test is a procedure that monitors the fetus's heartbeat. The test will monitor the heartbeat when the fetus is at rest and while the fetus is moving. In a healthy fetus, there will be an increase in fetal heart rate when the fetus moves or kicks. The heart rate will decrease at rest. This test helps determine if the fetus is healthy. Your health care provider will look at a number of patterns in the heart rate tracing to make sure your baby is thriving. If there is concern, your health care provider may order additional tests or may suggest another course of action. This test is often done in the third trimester and can help determine if an early delivery is needed and safe. Common reasons to have this test are:  You are past your due date.  You have a high-risk pregnancy.  You are feeling less movement than normal.  You have lost a pregnancy in the past.  Your health care provider suspects fetal growth problems.  You have too much or too little amniotic fluid.  What happens before the procedure?  Eat a meal right before the test or as directed by your  health care provider. Food may help stimulate fetal movements.  Use the restroom right before the test. What happens during the procedure?  Two belts will be placed around your abdomen. These belts have monitors attached to them. One records the fetal heart rate and the other records uterine contractions.  You may be asked to lie down on your side or to stay sitting upright.  You may be given a button to press when you feel movement.  The fetal heartbeat is listened to and watched on a screen. The heartbeat is recorded on a sheet of paper.  If the fetus seems to be sleeping, you may be asked to drink some juice or soda, gently press your abdomen, or make some noise to wake the fetus. What happens after the procedure? Your health care provider will discuss the test results with you and make recommendations for the near future.  This information is not intended to replace advice given to you by your health care provider. Make sure you discuss any questions you have with your health care provider. This information is not intended to replace advice given to you by your health care provider. Make sure you discuss any questions you have with your  health care provider. Document Released: 03/20/2002 Document Revised: 02/28/2016 Document Reviewed: 05/03/2012 Elsevier Interactive Patient Education  2018 [employer]. Fetal Movement Counts Patient Name: ________________________________________________ Patient Due Date: ____________________ What is a fetal movement count? A fetal movement count is the number of times that you feel your baby move during a certain amount of time. This may also be called a fetal kick count. A fetal movement count is recommended for every pregnant woman. You may be asked to start counting fetal movements as early as week 28 of your pregnancy. Pay attention to when your baby is most active. You may notice your baby's sleep and wake cycles. You may also notice things that  make your baby move more. You should do a fetal movement count:  When your baby is normally most active.  At the same time each day.  A good time to count movements is while you are resting, after having something to eat and drink. How do I count fetal movements? 1. Find a quiet, comfortable area. Sit, or lie down on your side. 2. Write down the date, the start time and stop time, and the number of movements that you felt between those two times. Take this information with you to your health care visits. 3. For 2 hours, count kicks, flutters, swishes, rolls, and jabs. You should feel at least 10 movements during 2 hours. 4. You may stop counting after you have felt 10 movements. 5. If you do not feel 10 movements in 2 hours, have something to eat and drink. Then, keep resting and counting for 1 hour. If you feel at least 4 movements during that hour, you may stop counting. Contact a health care provider if:  You feel fewer than 4 movements in 2 hours.  Your baby is not moving like he or she usually does. Date: ____________ Start time: ____________ Stop time: ____________ Movements: ____________ Date: ____________ Start time: ____________ Stop time: ____________ Movements: ____________ Date: ____________ Start time: ____________ Stop time: ____________ Movements: ____________ Date: ____________ Start time: ____________ Stop time: ____________ Movements: ____________ Date: ____________ Start time: ____________ Stop time: ____________ Movements: ____________ Date: ____________ Start time: ____________ Stop time: ____________ Movements: ____________ Date: ____________ Start time: ____________ Stop time: ____________ Movements: ____________ Date: ____________ Start time: ____________ Stop time: ____________ Movements: ____________ Date: ____________ Start time: ____________ Stop time: ____________ Movements: ____________ This information is not intended to replace advice given to you by your  health care provider. Make sure you discuss any questions you have with your health care provider. Document Released: 04/29/2006 Document Revised: 11/27/2015 Document Reviewed: 05/09/2015 Elsevier Interactive Patient Education  [employer].

## 2017-07-20 NOTE — Progress Notes (Signed)
ROB-Doing well, since admission for PTL. Growth US today (S<D). Ultrasound findings discussed with Dr. [name]. Plan for Biweekly testing in office with AFI and weekly dopplers followed by NST. Plan of care and ultrasound findings reviewed with patient and significant other; verbalized understanding. Discussed [email or web address] for optimization of fetal position. Reviewed red flag symptoms and when to call. RTC x Friday for NST or sooner if needed.   ULTRASOUND REPORT  Location: [facility] Date of Service: 07/20/2017  Indications: Growth for Size < Dates Findings:  Singleton intrauterine pregnancy is visualized with FHR at 143 BPM. Biometrics give an (U/S) Gestational age of 22 4/7 weeks and an (U/S) EDD of 10/01/17; this correlates with the clinically established EDD of 09/12/17.  Fetal presentation is breech.  EFW: 1350 grams (3lb 0oz).  10th percentile.  BPD is the only measurement that registers on the percentage scale at 3rd percentile.  Placenta: Anterior fundal and grade 3. AFI: Lower limits of normal for gestational age at 7.7 cm.  Umbilical dopplers are high for gestational age.  S/D ratio at mid and fetus measure greater than the 95th percentile at 3.80 and 4.26.  S/D ratio closest to placenta measures just below the 95th percentile at 3.52.  Fetal stomach, kidneys, and bladder appear WNL.  Impression: 1. 29 4/7 week Viable Singleton Intrauterine pregnancy by U/S. 2. (U/S) EDD is consistent with Clinically established (LMP) EDD of 09/12/17. 3. EFW: 1350 grams (3lb 0oz).  10th percentile.  BPD measures 3rd percentile. 4. AFI within lower limits of normal for gestational age at 7.7 cm. 5. S/D ratio for umbilical dopplers at mid and fetus measure greater than the 95th percentile at 3.80 and 4.26.  S/D ratio closest to placenta measures just below the 95th percentile at 3.52. 6. Notified Marcelino DusterMichelle of findings.  Recommendations: 1.Clinical correlation with the patient's  History and Physical Exam.

## 2017-07-22 ENCOUNTER — Other Ambulatory Visit: Payer: Self-pay | Admitting: Certified Nurse Midwife

## 2017-07-22 DIAGNOSIS — O365931 Maternal care for other known or suspected poor fetal growth, third trimester, fetus 1: Secondary | ICD-10-CM

## 2017-07-23 ENCOUNTER — Other Ambulatory Visit: Payer: Medicaid Other

## 2017-07-23 ENCOUNTER — Ambulatory Visit (INDEPENDENT_AMBULATORY_CARE_PROVIDER_SITE_OTHER): Payer: Medicaid Other | Admitting: Certified Nurse Midwife

## 2017-07-23 ENCOUNTER — Encounter: Payer: Self-pay | Admitting: Certified Nurse Midwife

## 2017-07-23 ENCOUNTER — Other Ambulatory Visit: Payer: Self-pay | Admitting: Certified Nurse Midwife

## 2017-07-23 VITALS — BP 98/61 | HR 77 | Wt 111.2 lb

## 2017-07-23 DIAGNOSIS — Z3403 Encounter for supervision of normal first pregnancy, third trimester: Secondary | ICD-10-CM | POA: Diagnosis not present

## 2017-07-23 DIAGNOSIS — O365930 Maternal care for other known or suspected poor fetal growth, third trimester, not applicable or unspecified: Secondary | ICD-10-CM | POA: Diagnosis not present

## 2017-07-23 LAB — POCT URINALYSIS DIPSTICK
Bilirubin, UA: NEGATIVE
Glucose, UA: NEGATIVE
Ketones, UA: NEGATIVE
LEUKOCYTES UA: NEGATIVE
NITRITE UA: NEGATIVE
PH UA: 5 (ref 5.0–8.0)
Protein, UA: NEGATIVE
RBC UA: NEGATIVE
SPEC GRAV UA: 1.01 (ref 1.010–1.025)
UROBILINOGEN UA: 0.2 U/dL

## 2017-07-23 NOTE — Progress Notes (Signed)
Pt is here for an ROB visit. 

## 2017-07-23 NOTE — Patient Instructions (Addendum)
Fetal Movement Counts Patient Name: ________________________________________________ Patient Due Date: ____________________ What is a fetal movement count? A fetal movement count is the number of times that you feel your baby move during a certain amount of time. This may also be called a fetal kick count. A fetal movement count is recommended for every pregnant woman. You may be asked to start counting fetal movements as early as week 28 of your pregnancy. Pay attention to when your baby is most active. You may notice your baby's sleep and wake cycles. You may also notice things that make your baby move more. You should do a fetal movement count:  When your baby is normally most active.  At the same time each day.  A good time to count movements is while you are resting, after having something to eat and drink. How do I count fetal movements? 1. Find a quiet, comfortable area. Sit, or lie down on your side. 2. Write down the date, the start time and stop time, and the number of movements that you felt between those two times. Take this information with you to your health care visits. 3. For 2 hours, count kicks, flutters, swishes, rolls, and jabs. You should feel at least 10 movements during 2 hours. 4. You may stop counting after you have felt 10 movements. 5. If you do not feel 10 movements in 2 hours, have something to eat and drink. Then, keep resting and counting for 1 hour. If you feel at least 4 movements during that hour, you may stop counting. Contact a health care provider if:  You feel fewer than 4 movements in 2 hours.  Your baby is not moving like he or she usually does. Date: ____________ Start time: ____________ Stop time: ____________ Movements: ____________ Date: ____________ Start time: ____________ Stop time: ____________ Movements: ____________ Date: ____________ Start time: ____________ Stop time: ____________ Movements: ____________ Date: ____________ Start time:  ____________ Stop time: ____________ Movements: ____________ Date: ____________ Start time: ____________ Stop time: ____________ Movements: ____________ Date: ____________ Start time: ____________ Stop time: ____________ Movements: ____________ Date: ____________ Start time: ____________ Stop time: ____________ Movements: ____________ Date: ____________ Start time: ____________ Stop time: ____________ Movements: ____________ Date: ____________ Start time: ____________ Stop time: ____________ Movements: ____________ This information is not intended to replace advice given to you by your health care provider. Make sure you discuss any questions you have with your health care provider. Document Released: 04/29/2006 Document Revised: 11/27/2015 Document Reviewed: 05/09/2015 Elsevier Interactive Patient Education  2018 [employer]. How a Baby Grows During Pregnancy Pregnancy begins when a female's sperm enters a female's egg (fertilization). This happens in one of the tubes (fallopian tubes) that connect the ovaries to the womb (uterus). The fertilized egg is called an embryo until it reaches 10 weeks. From 10 weeks until birth, it is called a fetus. The fertilized egg moves down the fallopian tube to the uterus. Then it implants into the lining of the uterus and begins to grow. The developing fetus receives oxygen and nutrients through the pregnant woman's bloodstream and the tissues that grow (placenta) to support the fetus. The placenta is the life support system for the fetus. It provides nutrition and removes waste. Learning as much as you can about your pregnancy and how your baby is developing can help you enjoy the experience. It can also make you aware of when there might be a problem and when to ask questions. How long does a typical pregnancy last? A pregnancy usually lasts 280  days, or about 40 weeks. Pregnancy is divided into three trimesters:  First trimester: 0-13 weeks.  Second  trimester: 14-27 weeks.  Third trimester: 28-40 weeks.  The day when your baby is considered ready to be born (full term) is your estimated date of delivery. How does my baby develop month by month? First month  The fertilized egg attaches to the inside of the uterus.  Some cells will form the placenta. Others will form the fetus.  The arms, legs, brain, spinal cord, lungs, and heart begin to develop.  At the end of the first month, the heart begins to beat.  Second month  The bones, inner ear, eyelids, hands, and feet form.  The genitals develop.  By the end of 8 weeks, all major organs are developing.  Third month  All of the internal organs are forming.  Teeth develop below the gums.  Bones and muscles begin to grow. The spine can flex.  The skin is transparent.  Fingernails and toenails begin to form.  Arms and legs continue to grow longer, and hands and feet develop.  The fetus is about 3 in (7.6 cm) long.  Fourth month  The placenta is completely formed.  The external sex organs, neck, outer ear, eyebrows, eyelids, and fingernails are formed.  The fetus can hear, swallow, and move its arms and legs.  The kidneys begin to produce urine.  The skin is covered with a white waxy coating (vernix) and very fine hair (lanugo).  Fifth month  The fetus moves around more and can be felt for the first time (quickening).  The fetus starts to sleep and wake up and may begin to suck its finger.  The nails grow to the end of the fingers.  The organ in the digestive system that makes bile (gallbladder) functions and helps to digest the nutrients.  If your baby is a girl, eggs are present in her ovaries. If your baby is a boy, testicles start to move down into his scrotum.  Sixth month  The lungs are formed, but the fetus is not yet able to breathe.  The eyes open. The brain continues to develop.  Your baby has fingerprints and toe prints. Your baby's hair  grows thicker.  At the end of the second trimester, the fetus is about 9 in (22.9 cm) long.  Seventh month  The fetus kicks and stretches.  The eyes are developed enough to sense changes in light.  The hands can make a grasping motion.  The fetus responds to sound.  Eighth month  All organs and body systems are fully developed and functioning.  Bones harden and taste buds develop. The fetus may hiccup.  Certain areas of the brain are still developing. The skull remains soft.  Ninth month  The fetus gains about  lb (0.23 kg) each week.  The lungs are fully developed.  Patterns of sleep develop.  The fetus's head typically moves into a head-down position (vertex) in the uterus to prepare for birth. If the buttocks move into a vertex position instead, the baby is breech.  The fetus weighs 6-9 lbs (2.72-4.08 kg) and is 19-20 in (48.26-50.8 cm) long.  What can I do to have a healthy pregnancy and help my baby develop? Eating and Drinking  Eat a healthy diet. ? Talk with your health care provider to make sure that you are getting the nutrients that you and your baby need. ? Visit www.[employer].bechoosemyplate.gov to learn about creating a healthy diet.  Gain a healthy amount of weight during pregnancy as advised by your health care provider. This is usually 25-35 pounds. You may need to: ? Gain more if you were underweight before getting pregnant or if you are pregnant with more than one baby. ? Gain less if you were overweight or obese when you got pregnant.  Medicines and Vitamins  Take prenatal vitamins as directed by your health care provider. These include vitamins such as folic acid, iron, calcium, and vitamin D. They are important for healthy development.  Take medicines only as directed by your health care provider. Read labels and ask a pharmacist or your health care provider whether over-the-counter medicines, supplements, and prescription drugs are safe to take during  pregnancy.  Activities  Be physically active as advised by your health care provider. Ask your health care provider to recommend activities that are safe for you to do, such as walking or swimming.  Do not participate in strenuous or extreme sports.  Lifestyle  Do not drink alcohol.  Do not use any tobacco products, including cigarettes, chewing tobacco, or electronic cigarettes. If you need help quitting, ask your health care provider.  Do not use illegal drugs.  Safety  Avoid exposure to mercury, lead, or other heavy metals. Ask your health care provider about common sources of these heavy metals.  Avoid listeria infection during pregnancy. Follow these precautions: ? Do not eat soft cheeses or deli meats. ? Do not eat hot dogs unless they have been warmed up to the point of steaming, such as in the microwave oven. ? Do not drink unpasteurized milk.  Avoid toxoplasmosis infection during pregnancy. Follow these precautions: ? Do not change your cat's litter box, if you have a cat. Ask someone else to do this for you. ? Wear gardening gloves while working in the yard.  General Instructions  Keep all follow-up visits as directed by your health care provider. This is important. This includes prenatal care and screening tests.  Manage any chronic health conditions. Work closely with your health care provider to keep conditions, such as diabetes, under control.  How do I know if my baby is developing well? At each prenatal visit, your health care provider will do several different tests to check on your health and keep track of your baby's development. These include:  Fundal height. ? Your health care provider will measure your growing belly from top to bottom using a tape measure. ? Your health care provider will also feel your belly to determine your baby's position.  Heartbeat. ? An ultrasound in the first trimester can confirm pregnancy and show a heartbeat, depending on  how far along you are. ? Your health care provider will check your baby's heart rate at every prenatal visit. ? As you get closer to your delivery date, you may have regular fetal heart rate monitoring to make sure that your baby is not in distress.  Second trimester ultrasound. ? This ultrasound checks your baby's development. It also indicates your baby's gender.  What should I do if I have concerns about my baby's development? Always talk with your health care provider about any concerns that you may have. This information is not intended to replace advice given to you by your health care provider. Make sure you discuss any questions you have with your health care provider. Document Released: 09/16/2007 Document Revised: 09/05/2015 Document Reviewed: 09/06/2013 Elsevier Interactive Patient Education  2018 [employer] Inc[employer]  Fetal Movement Counts Patient Name: ________________________________________________ Patient Due  Date: ____________________ What is a fetal movement count? A fetal movement count is the number of times that you feel your baby move during a certain amount of time. This may also be called a fetal kick count. A fetal movement count is recommended for every pregnant woman. You may be asked to start counting fetal movements as early as week 28 of your pregnancy. Pay attention to when your baby is most active. You may notice your baby's sleep and wake cycles. You may also notice things that make your baby move more. You should do a fetal movement count:  When your baby is normally most active.  At the same time each day.  A good time to count movements is while you are resting, after having something to eat and drink. How do I count fetal movements? 6. Find a quiet, comfortable area. Sit, or lie down on your side. 7. Write down the date, the start time and stop time, and the number of movements that you felt between those two times. Take this information with you to your  health care visits. 8. For 2 hours, count kicks, flutters, swishes, rolls, and jabs. You should feel at least 10 movements during 2 hours. 9. You may stop counting after you have felt 10 movements. 10. If you do not feel 10 movements in 2 hours, have something to eat and drink. Then, keep resting and counting for 1 hour. If you feel at least 4 movements during that hour, you may stop counting. Contact a health care provider if:  You feel fewer than 4 movements in 2 hours.  Your baby is not moving like he or she usually does. Date: ____________ Start time: ____________ Stop time: ____________ Movements: ____________ Date: ____________ Start time: ____________ Stop time: ____________ Movements: ____________ Date: ____________ Start time: ____________ Stop time: ____________ Movements: ____________ Date: ____________ Start time: ____________ Stop time: ____________ Movements: ____________ Date: ____________ Start time: ____________ Stop time: ____________ Movements: ____________ Date: ____________ Start time: ____________ Stop time: ____________ Movements: ____________ Date: ____________ Start time: ____________ Stop time: ____________ Movements: ____________ Date: ____________ Start time: ____________ Stop time: ____________ Movements: ____________ Date: ____________ Start time: ____________ Stop time: ____________ Movements: ____________ This information is not intended to replace advice given to you by your health care provider. Make sure you discuss any questions you have with your health care provider. Document Released: 04/29/2006 Document Revised: 11/27/2015 Document Reviewed: 05/09/2015 Elsevier Interactive Patient Education  [employer].

## 2017-07-23 NOTE — Progress Notes (Signed)
ROB and NST for IUGR. NST reactive, category 1 strip. Baseline 135 with moderate variability and accelerations present. No decels noted. Irritability . Discussed MFM appointment on the 22nd. She will return on Tuesday for u/s & doppler and on Friday for repeat NST. She verbalizes understanding and agrees to plan   Doreene BurkeAnnie Izetta Sakamoto, CNM

## 2017-07-27 ENCOUNTER — Ambulatory Visit (INDEPENDENT_AMBULATORY_CARE_PROVIDER_SITE_OTHER): Payer: Medicaid Other

## 2017-07-27 DIAGNOSIS — O365930 Maternal care for other known or suspected poor fetal growth, third trimester, not applicable or unspecified: Secondary | ICD-10-CM | POA: Diagnosis not present

## 2017-08-02 ENCOUNTER — Ambulatory Visit (INDEPENDENT_AMBULATORY_CARE_PROVIDER_SITE_OTHER): Payer: Medicaid Other | Admitting: Certified Nurse Midwife

## 2017-08-02 ENCOUNTER — Other Ambulatory Visit: Payer: Self-pay | Admitting: Certified Nurse Midwife

## 2017-08-02 ENCOUNTER — Ambulatory Visit
Admission: RE | Admit: 2017-08-02 | Discharge: 2017-08-02 | Disposition: A | Discharge status: Home / Self Care | Payer: Medicaid Other | Source: Ambulatory Visit | Attending: Obstetrics & Gynecology | Admitting: Obstetrics & Gynecology

## 2017-08-02 ENCOUNTER — Other Ambulatory Visit: Payer: Medicaid Other

## 2017-08-02 VITALS — BP 104/65 | HR 83 | Wt 109.1 lb

## 2017-08-02 DIAGNOSIS — Z3403 Encounter for supervision of normal first pregnancy, third trimester: Secondary | ICD-10-CM

## 2017-08-02 DIAGNOSIS — O365930 Maternal care for other known or suspected poor fetal growth, third trimester, not applicable or unspecified: Secondary | ICD-10-CM | POA: Insufficient documentation

## 2017-08-02 DIAGNOSIS — Z3A34 34 weeks gestation of pregnancy: Secondary | ICD-10-CM | POA: Diagnosis not present

## 2017-08-02 DIAGNOSIS — O365931 Maternal care for other known or suspected poor fetal growth, third trimester, fetus 1: Secondary | ICD-10-CM

## 2017-08-02 HISTORY — DX: Other specified health status: Z78.9

## 2017-08-02 LAB — POCT URINALYSIS DIPSTICK
Bilirubin, UA: NEGATIVE
GLUCOSE UA: NEGATIVE
Ketones, UA: 5
LEUKOCYTES UA: NEGATIVE
NITRITE UA: NEGATIVE
RBC UA: NEGATIVE
Spec Grav, UA: 1.015 (ref 1.010–1.025)
Urobilinogen, UA: 0.2 E.U./dL
pH, UA: 7 (ref 5.0–8.0)

## 2017-08-02 NOTE — Progress Notes (Signed)
ROB-pt is doing well no concerns or problems at this time.

## 2017-08-02 NOTE — ED Notes (Signed)
Patient scheduled for US MFM for poor fetal growth

## 2017-08-02 NOTE — Patient Instructions (Signed)

## 2017-08-03 NOTE — Progress Notes (Signed)
ROB-NST performed today was reviewed and was found to be reactive. Baseline 135 bpm with Moderate variability; No decels noted. Occasional contractions present. Appointment with MFM later today. Reviewed red flag symptoms and when to call. RTC x Thursday for NST or sooner if needed.

## 2017-08-05 ENCOUNTER — Other Ambulatory Visit: Payer: Medicaid Other

## 2017-08-05 ENCOUNTER — Ambulatory Visit (INDEPENDENT_AMBULATORY_CARE_PROVIDER_SITE_OTHER): Payer: Medicaid Other | Admitting: Certified Nurse Midwife

## 2017-08-05 VITALS — BP 107/70 | HR 90 | Wt 112.4 lb

## 2017-08-05 DIAGNOSIS — Z3403 Encounter for supervision of normal first pregnancy, third trimester: Secondary | ICD-10-CM | POA: Diagnosis not present

## 2017-08-05 LAB — POCT URINALYSIS DIPSTICK
BILIRUBIN UA: NEGATIVE
Glucose, UA: NEGATIVE
Ketones, UA: NEGATIVE
Leukocytes, UA: NEGATIVE
Nitrite, UA: NEGATIVE
Protein, UA: NEGATIVE
RBC UA: NEGATIVE
Spec Grav, UA: 1.015 (ref 1.010–1.025)
Urobilinogen, UA: 0.2 E.U./dL
pH, UA: 6.5 (ref 5.0–8.0)

## 2017-08-05 NOTE — Patient Instructions (Signed)
Fetal Movement Counts Patient Name: ________________________________________________ Patient Due Date: ____________________ What is a fetal movement count? A fetal movement count is the number of times that you feel your baby move during a certain amount of time. This may also be called a fetal kick count. A fetal movement count is recommended for every pregnant woman. You may be asked to start counting fetal movements as early as week 28 of your pregnancy. Pay attention to when your baby is most active. You may notice your baby's sleep and wake cycles. You may also notice things that make your baby move more. You should do a fetal movement count:  When your baby is normally most active.  At the same time each day.  A good time to count movements is while you are resting, after having something to eat and drink. How do I count fetal movements? 1. Find a quiet, comfortable area. Sit, or lie down on your side. 2. Write down the date, the start time and stop time, and the number of movements that you felt between those two times. Take this information with you to your health care visits. 3. For 2 hours, count kicks, flutters, swishes, rolls, and jabs. You should feel at least 10 movements during 2 hours. 4. You may stop counting after you have felt 10 movements. 5. If you do not feel 10 movements in 2 hours, have something to eat and drink. Then, keep resting and counting for 1 hour. If you feel at least 4 movements during that hour, you may stop counting. Contact a health care provider if:  You feel fewer than 4 movements in 2 hours.  Your baby is not moving like he or she usually does. Date: ____________ Start time: ____________ Stop time: ____________ Movements: ____________ Date: ____________ Start time: ____________ Stop time: ____________ Movements: ____________ Date: ____________ Start time: ____________ Stop time: ____________ Movements: ____________ Date: ____________ Start time:  ____________ Stop time: ____________ Movements: ____________ Date: ____________ Start time: ____________ Stop time: ____________ Movements: ____________ Date: ____________ Start time: ____________ Stop time: ____________ Movements: ____________ Date: ____________ Start time: ____________ Stop time: ____________ Movements: ____________ Date: ____________ Start time: ____________ Stop time: ____________ Movements: ____________ Date: ____________ Start time: ____________ Stop time: ____________ Movements: ____________ This information is not intended to replace advice given to you by your health care provider. Make sure you discuss any questions you have with your health care provider. Document Released: 04/29/2006 Document Revised: 11/27/2015 Document Reviewed: 05/09/2015 Elsevier Interactive Patient Education  2018 [employer] Nonstress Test The nonstress test is a procedure that monitors the fetus's heartbeat. The test will monitor the heartbeat when the fetus is at rest and while the fetus is moving. In a healthy fetus, there will be an increase in fetal heart rate when the fetus moves or kicks. The heart rate will decrease at rest. This test helps determine if the fetus is healthy. Your health care provider will look at a number of patterns in the heart rate tracing to make sure your baby is thriving. If there is concern, your health care provider may order additional tests or may suggest another course of action. This test is often done in the third trimester and can help determine if an early delivery is needed and safe. Common reasons to have this test are:  You are past your due date.  You have a high-risk pregnancy.  You are feeling less movement than normal.  You have lost a pregnancy in the past.  Your health   care provider suspects fetal growth problems.  You have too much or too little amniotic fluid.  What happens before the procedure?  Eat a meal right before the test or as  directed by your health care provider. Food may help stimulate fetal movements.  Use the restroom right before the test. What happens during the procedure?  Two belts will be placed around your abdomen. These belts have monitors attached to them. One records the fetal heart rate and the other records uterine contractions.  You may be asked to lie down on your side or to stay sitting upright.  You may be given a button to press when you feel movement.  The fetal heartbeat is listened to and watched on a screen. The heartbeat is recorded on a sheet of paper.  If the fetus seems to be sleeping, you may be asked to drink some juice or soda, gently press your abdomen, or make some noise to wake the fetus. What happens after the procedure? Your health care provider will discuss the test results with you and make recommendations for the near future.  This information is not intended to replace advice given to you by your health care provider. Make sure you discuss any questions you have with your health care provider. This information is not intended to replace advice given to you by your health care provider. Make sure you discuss any questions you have with your health care provider. Document Released: 03/20/2002 Document Revised: 02/28/2016 Document Reviewed: 05/03/2012 Elsevier Interactive Patient Education  2018 [employer]. Breech Birth What is a breech birth? A breech birth is when a baby is born with the buttocks or the feet first. Most babies are in a head down (vertex) position when they are born. There are three types of breech babies:  When the baby's buttocks are showing first in the birth canal (vagina) with the legs straight up and the feet at the baby's head (frank breech).  When the baby's buttocks shows first with the legs bent at the knees and the feet down near the buttocks (complete breech).  When one or both of the baby's feet are down below the buttocks (footling  breech).  What are the risks of a breech birth? Having a breech birth increases the risk to your baby. A breech birth may cause the following:  Umbilical cord prolapse. This is when the umbilical cord is in front of the baby before or during labor. This can cause the cord to become pinched or compressed. This can reduce the flow of blood and oxygen to the baby.  The baby getting stuck in the birth canal, which can cause injury or, rarely, death.  Injury to the nerves in the shoulder, arm, and hand (brachial plexus injury) when delivered.  Your baby being born too early (prematurely).  An increased need for a cesarean delivery.  What increases the risk of having a breech baby? It is not known what causes your baby to be breech. However, risk factors that may increase your chances of having a breech baby include the following:  The mother having had several babies already.  The mother having twins or more.  The mother having a baby with certain congenital disabilities.  The mother going into labor early.  The mother having problems with her uterus, such as a tumor.  The mother having placenta problems (placenta previa) or too much or not enough fluid surrounding the baby (amniotic fluid).  How do I know if my baby  is breech? There are no symptoms for you to know that your baby is breech. When you are close to your due date, your health care provider can tell if your baby is breech by:  An abdominal or vaginal (pelvic) exam.  An ultrasound.  Your health care provider may also be able to tell that your baby is breech if your baby's heartbeat is heard above your belly button. What can be done if my baby is breech?  Your health care provider may try to turn the baby in your uterus. This is a procedure called external cephalic version (ECV). This is done by your health care provider. He or she will place both hands on your abdomen and gently and slowly turn the baby around. It is  important to know that ECV can increase your chances of suddenly going into labor. If an ECV is done, it is done toward the end of a healthy pregnancy. The baby may remain in this position or he or she may turn back to the breech position. You and your health care provider will discuss if an ECV is recommended for you and your baby. How will I delivery my baby if my baby is breech? You and your health care provider will discuss the best way to deliver your baby. If your baby is breech, it is less likely that a vaginal delivery will be recommended due to the risks. Some breech babies may be delivered safely without a cesarean, while in other cases health care providers will recommend a cesarean delivery. This information is not intended to replace advice given to you by your health care provider. Make sure you discuss any questions you have with your health care provider. Document Released: 05/21/2006 Document Revised: 03/16/2016 Document Reviewed: 02/01/2014 Elsevier Interactive Patient Education  2017 [employer]

## 2017-08-05 NOTE — Progress Notes (Addendum)
NONSTRESS TEST INTERPRETATION  INDICATIONS:  IUGR  FHR baseline: 140 RESULTS:Reactive COMMENTS: Occasional contractions present   PLAN: 1. Continue fetal kick counts twice a day. 2. Continue antepartum testing as scheduled-Biweekly 3. Discussed transfer to [facility] for duration of prenatal care and possibility of midwife assisting with primary cesarean section at 37 weeks if fetus remains breech. Transfer discussed with Dr. Greggory KeeneFrancesco and agrees with POC.  4. Reviewed red flag symptoms and when to call.    Gunnar BullaJenkins Michelle Zhanae Proffit, CNM [facility], [facility]

## 2017-08-09 ENCOUNTER — Ambulatory Visit
Admission: RE | Admit: 2017-08-09 | Discharge: 2017-08-09 | Disposition: A | Discharge status: Home / Self Care | Payer: Medicaid Other | Source: Ambulatory Visit | Attending: Maternal & Fetal Medicine | Admitting: Maternal & Fetal Medicine

## 2017-08-09 ENCOUNTER — Other Ambulatory Visit: Payer: Self-pay | Admitting: *Deleted

## 2017-08-09 DIAGNOSIS — O365930 Maternal care for other known or suspected poor fetal growth, third trimester, not applicable or unspecified: Secondary | ICD-10-CM

## 2017-08-09 DIAGNOSIS — Z3A35 35 weeks gestation of pregnancy: Secondary | ICD-10-CM | POA: Diagnosis not present

## 2017-08-12 ENCOUNTER — Other Ambulatory Visit: Payer: Medicaid Other

## 2017-08-12 ENCOUNTER — Ambulatory Visit (INDEPENDENT_AMBULATORY_CARE_PROVIDER_SITE_OTHER): Payer: Medicaid Other | Admitting: Obstetrics and Gynecology

## 2017-08-12 ENCOUNTER — Other Ambulatory Visit: Payer: Self-pay | Admitting: *Deleted

## 2017-08-12 VITALS — BP 95/64 | HR 90 | Wt 108.6 lb

## 2017-08-12 DIAGNOSIS — Z3403 Encounter for supervision of normal first pregnancy, third trimester: Secondary | ICD-10-CM

## 2017-08-12 DIAGNOSIS — O365930 Maternal care for other known or suspected poor fetal growth, third trimester, not applicable or unspecified: Secondary | ICD-10-CM

## 2017-08-12 DIAGNOSIS — Z113 Encounter for screening for infections with a predominantly sexual mode of transmission: Secondary | ICD-10-CM

## 2017-08-12 DIAGNOSIS — Z3685 Encounter for antenatal screening for Streptococcus B: Secondary | ICD-10-CM

## 2017-08-12 LAB — POCT URINALYSIS DIPSTICK
Bilirubin, UA: NEGATIVE
GLUCOSE UA: NEGATIVE
Nitrite, UA: NEGATIVE
Urobilinogen, UA: 0.2 E.U./dL
pH, UA: 7.5 (ref 5.0–8.0)

## 2017-08-12 NOTE — Progress Notes (Signed)
ROB: Patient reports active fetal movement.  Getting Dopplers once a week at [facility] and NSTs here.  We have discussed ketones and weight loss in detail patient says she is eating " a lot every day".  We discussed delivery at 37 weeks based on fetal growth.  CD for breech and IUGR scheduled for 5/13.  If growth improved consider delay of CD.  Discussed positive tox screen and follow-up testing at delivery.  GC/CT-GBS performed.  Patient asymptomatic but possible UTI based on UA.  Urine sent for C&S- will contact patient if treatment necessary.  NONSTRESS TEST INTERPRETATION  INDICATIONS: fetus small for gestational age RESULTS:  reactive COMMENTS:   PLAN: 1. Continue fetal kick counts as directed. 2. Continue antepartum testing as scheduled.  Elonda Husky, M.D. 08/12/2017 10:46 AM

## 2017-08-12 NOTE — Progress Notes (Signed)
ROB pt is doing well no concerns.  

## 2017-08-13 NOTE — Addendum Note (Signed)
Addended by: Silvano Bilis on: 08/13/2017 04:00 PM   Modules accepted: Orders

## 2017-08-14 LAB — URINE CULTURE

## 2017-08-14 LAB — STREP GP B NAA: STREP GROUP B AG: NEGATIVE

## 2017-08-15 LAB — GC/CHLAMYDIA PROBE AMP
CHLAMYDIA, DNA PROBE: NEGATIVE
NEISSERIA GONORRHOEAE BY PCR: NEGATIVE

## 2017-08-16 ENCOUNTER — Ambulatory Visit
Admission: RE | Admit: 2017-08-16 | Discharge: 2017-08-16 | Disposition: A | Discharge status: Home / Self Care | Payer: Medicaid Other | Source: Ambulatory Visit | Attending: Obstetrics and Gynecology | Admitting: Obstetrics and Gynecology

## 2017-08-16 DIAGNOSIS — Z3A36 36 weeks gestation of pregnancy: Secondary | ICD-10-CM | POA: Insufficient documentation

## 2017-08-16 DIAGNOSIS — O4103X0 Oligohydramnios, third trimester, not applicable or unspecified: Secondary | ICD-10-CM | POA: Insufficient documentation

## 2017-08-16 DIAGNOSIS — O365930 Maternal care for other known or suspected poor fetal growth, third trimester, not applicable or unspecified: Secondary | ICD-10-CM | POA: Diagnosis present

## 2017-08-19 ENCOUNTER — Ambulatory Visit (INDEPENDENT_AMBULATORY_CARE_PROVIDER_SITE_OTHER): Payer: Medicaid Other | Admitting: Obstetrics and Gynecology

## 2017-08-19 ENCOUNTER — Other Ambulatory Visit: Payer: Self-pay | Admitting: *Deleted

## 2017-08-19 VITALS — BP 96/61 | HR 98 | Wt 112.7 lb

## 2017-08-19 DIAGNOSIS — O09513 Supervision of elderly primigravida, third trimester: Secondary | ICD-10-CM | POA: Diagnosis not present

## 2017-08-19 DIAGNOSIS — O365930 Maternal care for other known or suspected poor fetal growth, third trimester, not applicable or unspecified: Secondary | ICD-10-CM

## 2017-08-19 DIAGNOSIS — O321XX0 Maternal care for breech presentation, not applicable or unspecified: Secondary | ICD-10-CM

## 2017-08-19 DIAGNOSIS — O4103X0 Oligohydramnios, third trimester, not applicable or unspecified: Secondary | ICD-10-CM

## 2017-08-19 LAB — POCT URINALYSIS DIPSTICK
Bilirubin, UA: NEGATIVE
Blood, UA: NEGATIVE
GLUCOSE UA: NEGATIVE
Ketones, UA: NEGATIVE
NITRITE UA: NEGATIVE
Protein, UA: NEGATIVE
SPEC GRAV UA: 1.01 (ref 1.010–1.025)
Urobilinogen, UA: 0.2 E.U./dL
pH, UA: 7.5 (ref 5.0–8.0)

## 2017-08-19 NOTE — Progress Notes (Signed)
ROB: Patient presents today, no complaints.  Had questions regarding plan of care. Notes that she was told by [facility] that she would have a growth scan on Monday to f/u [facility]UR, but was also told by MD (Dr. Logan Bores) that her C-section was going to be scheduled for Monday.  Contacted [facility] who recommend based on recent BPP form 08/16/17 (noting oligohydramnios), that patient should be delivered at 37 weeks. Will have patient scheduled for delivery at 37 weeks as it appears that it has not been officially scheduled. GBS neg.    [place] MFM FETAL BPP WO NON STRESS ----------------------------------------------------------------------  OBSTETRICS REPORT                      (Signed Final 08/16/2017 04:44 pm) ---------------------------------------------------------------------- PATIENT INFO:  ID #:       [id number]                          D.O.B.:  09/21/1995 (21 yrs)  Name:       [name]              Visit Date: 08/16/2017 04:38 pm ---------------------------------------------------------------------- PERFORMED BY:  Performed By:     Trixie Dredge          Referred By:      Gean Maidens                                                             [facility] ---------------------------------------------------------------------- SERVICE(S) PROVIDED:   [place] MFM FETAL BPP WO NON STRESS                       40981.19  ---------------------------------------------------------------------- INDICATIONS:   [redacted] weeks gestation of pregnancy                Z3A.36   Fetal growth restriction   BPP with cord Dopplers  ---------------------------------------------------------------------- FETAL EVALUATION:  Num Of Fetuses:     1  Fetal Heart         169  Rate(bpm):  Cardiac Activity:   Present  Presentation:       Breech  Placenta:           Fundal  Amniotic Fluid  AFI FV:      Oligohydramnios  AFI Sum(cm)     %Tile       Largest Pocket(cm)  6.05            < 3         2.77  RUQ(cm)        RLQ(cm)       LUQ(cm)        LLQ(cm)  0.54          1.14          2.77           1.6  Comment:    2.5 x 5.5 cm ---------------------------------------------------------------------- BIOPHYSICAL EVALUATION:  Amniotic F.V:   Pocket => 2 cm two         F. Tone:        Observed                  planes  F. Movement:    Observed  Score:          8/8  F. Breathing:   Observed ---------------------------------------------------------------------- GESTATIONAL AGE:  LMP:           36w 1d        Date:  12/06/16                 EDD:   09/12/17  Best:          36w 1d     Det. By:  LMP  (12/06/16)          EDD:   09/12/17 ---------------------------------------------------------------------- DOPPLER - FETAL VESSELS:  Umbilical Artery   S/D     %tile  2.74       67 ---------------------------------------------------------------------- IMPRESSION:  Single intrauterine pregnancy with a best estimated  gestational age of [redacted] weeks 1 day.  Dating is based on LMP  consistent with ultrasound performed at [facility] on  01/23/2017; measurements were reported as 7 weeks 1 day.  Exam limited to BPP with cord Dopplers.  BPP 8/8.  AFI is 6.1 cm with a 2.5 x 5.5 cm pocket.  Mean  umbilical cord Doppler s/d ratio is 2.74 (nl).  NST scheduled for Thursday with delivery scheduled for May  13. ----------------------------------------------------------------------                  Kirby Funk, MD Electronically Signed Final Report   08/16/2017 04:44 pm ----------------------------------------------------------------------

## 2017-08-19 NOTE — Progress Notes (Signed)
ROB pt is doing well no concerns.  

## 2017-08-20 ENCOUNTER — Encounter
Admission: EM | Disposition: A | Discharge status: Home / Self Care | Payer: Self-pay | Source: Home / Self Care | Attending: Obstetrics and Gynecology

## 2017-08-20 ENCOUNTER — Inpatient Hospital Stay: Payer: Medicaid Other | Admitting: Certified Registered"

## 2017-08-20 ENCOUNTER — Other Ambulatory Visit: Payer: Self-pay

## 2017-08-20 ENCOUNTER — Inpatient Hospital Stay
Admission: EM | Admit: 2017-08-20 | Discharge: 2017-08-22 | DRG: 787 | Disposition: A | Discharge status: Home / Self Care | Payer: Medicaid Other | Attending: Obstetrics and Gynecology | Admitting: Obstetrics and Gynecology

## 2017-08-20 DIAGNOSIS — O42913 Preterm premature rupture of membranes, unspecified as to length of time between rupture and onset of labor, third trimester: Principal | ICD-10-CM

## 2017-08-20 DIAGNOSIS — Z3A36 36 weeks gestation of pregnancy: Secondary | ICD-10-CM

## 2017-08-20 DIAGNOSIS — O365930 Maternal care for other known or suspected poor fetal growth, third trimester, not applicable or unspecified: Secondary | ICD-10-CM | POA: Diagnosis present

## 2017-08-20 DIAGNOSIS — O99324 Drug use complicating childbirth: Secondary | ICD-10-CM | POA: Diagnosis present

## 2017-08-20 DIAGNOSIS — O321XX0 Maternal care for breech presentation, not applicable or unspecified: Secondary | ICD-10-CM | POA: Diagnosis present

## 2017-08-20 DIAGNOSIS — O4100X0 Oligohydramnios, unspecified trimester, not applicable or unspecified: Secondary | ICD-10-CM | POA: Diagnosis present

## 2017-08-20 DIAGNOSIS — O42013 Preterm premature rupture of membranes, onset of labor within 24 hours of rupture, third trimester: Secondary | ICD-10-CM | POA: Diagnosis present

## 2017-08-20 DIAGNOSIS — F1220 Cannabis dependence, uncomplicated: Secondary | ICD-10-CM | POA: Diagnosis present

## 2017-08-20 DIAGNOSIS — Z87891 Personal history of nicotine dependence: Secondary | ICD-10-CM

## 2017-08-20 DIAGNOSIS — O4103X0 Oligohydramnios, third trimester, not applicable or unspecified: Secondary | ICD-10-CM | POA: Diagnosis present

## 2017-08-20 HISTORY — DX: Nicotine dependence, unspecified, uncomplicated: F17.200

## 2017-08-20 HISTORY — DX: Major depressive disorder, single episode, moderate: F32.1

## 2017-08-20 HISTORY — DX: Cannabis dependence, uncomplicated: F12.20

## 2017-08-20 HISTORY — DX: Other problems related to lifestyle: Z72.89

## 2017-08-20 LAB — URINE DRUG SCREEN, QUALITATIVE (ARMC ONLY)
Amphetamines, Ur Screen: NOT DETECTED
BARBITURATES, UR SCREEN: NOT DETECTED
BENZODIAZEPINE, UR SCRN: NOT DETECTED
Cannabinoid 50 Ng, Ur ~~LOC~~: POSITIVE — AB
Cocaine Metabolite,Ur ~~LOC~~: NOT DETECTED
MDMA (Ecstasy)Ur Screen: NOT DETECTED
Methadone Scn, Ur: NOT DETECTED
OPIATE, UR SCREEN: NOT DETECTED
Phencyclidine (PCP) Ur S: NOT DETECTED
TRICYCLIC, UR SCREEN: NOT DETECTED

## 2017-08-20 LAB — CBC
HEMATOCRIT: 31 % — AB (ref 35.0–47.0)
Hemoglobin: 10.8 g/dL — ABNORMAL LOW (ref 12.0–16.0)
MCH: 34.5 pg — AB (ref 26.0–34.0)
MCHC: 35 g/dL (ref 32.0–36.0)
MCV: 98.8 fL (ref 80.0–100.0)
Platelets: 192 10*3/uL (ref 150–440)
RBC: 3.14 MIL/uL — ABNORMAL LOW (ref 3.80–5.20)
RDW: 13 % (ref 11.5–14.5)
WBC: 12.9 10*3/uL — ABNORMAL HIGH (ref 3.6–11.0)

## 2017-08-20 LAB — RAPID HIV SCREEN (HIV 1/2 AB+AG)
HIV 1/2 Antibodies: NONREACTIVE
HIV-1 P24 ANTIGEN - HIV24: NONREACTIVE

## 2017-08-20 LAB — CHLAMYDIA/NGC RT PCR (ARMC ONLY)
CHLAMYDIA TR: NOT DETECTED
N gonorrhoeae: NOT DETECTED

## 2017-08-20 LAB — TYPE AND SCREEN
ABO/RH(D): AB POS
Antibody Screen: NEGATIVE

## 2017-08-20 SURGERY — Surgical Case
Anesthesia: Spinal | Wound class: Clean Contaminated

## 2017-08-20 MED ORDER — DIPHENHYDRAMINE HCL 25 MG PO CAPS: 25.0000 mg | ORAL_CAPSULE | ORAL | Status: DC | PRN

## 2017-08-20 MED ORDER — WITCH HAZEL-GLYCERIN EX PADS: 1.0000 "application " | MEDICATED_PAD | CUTANEOUS | Status: DC | PRN

## 2017-08-20 MED ORDER — CEFAZOLIN SODIUM-DEXTROSE 2-3 GM-%(50ML) IV SOLR
INTRAVENOUS | Status: DC | PRN
  Administered 2017-08-20: 2 g via INTRAVENOUS

## 2017-08-20 MED ORDER — DEXAMETHASONE SODIUM PHOSPHATE 10 MG/ML IJ SOLN
INTRAMUSCULAR | Status: AC
  Filled 2017-08-20: qty 1

## 2017-08-20 MED ORDER — COCONUT OIL OIL: 1.0000 "application " | TOPICAL_OIL | Status: DC | PRN

## 2017-08-20 MED ORDER — DIPHENHYDRAMINE HCL 50 MG/ML IJ SOLN: 12.5000 mg | INTRAMUSCULAR | Status: DC | PRN

## 2017-08-20 MED ORDER — OXYTOCIN 40 UNITS IN LACTATED RINGERS INFUSION - SIMPLE MED
INTRAVENOUS | Status: AC
  Filled 2017-08-20: qty 1000

## 2017-08-20 MED ORDER — ONDANSETRON HCL 4 MG/2ML IJ SOLN
INTRAMUSCULAR | Status: AC
  Filled 2017-08-20: qty 2

## 2017-08-20 MED ORDER — PHENYLEPHRINE HCL 10 MG/ML IJ SOLN
INTRAMUSCULAR | Status: DC | PRN
  Administered 2017-08-20: 50 ug via INTRAVENOUS
  Administered 2017-08-20: 100 ug via INTRAVENOUS
  Administered 2017-08-20: 50 ug via INTRAVENOUS
  Administered 2017-08-20 (×3): 100 ug via INTRAVENOUS
  Administered 2017-08-20: 50 ug via INTRAVENOUS
  Administered 2017-08-20 (×2): 100 ug via INTRAVENOUS

## 2017-08-20 MED ORDER — MORPHINE SULFATE (PF) 0.5 MG/ML IJ SOLN
INTRAMUSCULAR | Status: AC
  Filled 2017-08-20: qty 10

## 2017-08-20 MED ORDER — KETOROLAC TROMETHAMINE 30 MG/ML IJ SOLN
30.0000 mg | Freq: Four times a day (QID) | INTRAMUSCULAR | Status: AC | PRN
  Administered 2017-08-20 (×2): 30 mg via INTRAVENOUS
  Filled 2017-08-20: qty 1

## 2017-08-20 MED ORDER — ONDANSETRON HCL 4 MG/2ML IJ SOLN
INTRAMUSCULAR | Status: DC | PRN
  Administered 2017-08-20: 4 mg via INTRAVENOUS

## 2017-08-20 MED ORDER — ONDANSETRON HCL 4 MG/2ML IJ SOLN: 4.0000 mg | Freq: Once | INTRAMUSCULAR | Status: DC | PRN

## 2017-08-20 MED ORDER — SENNOSIDES-DOCUSATE SODIUM 8.6-50 MG PO TABS
2.0000 | ORAL_TABLET | ORAL | Status: DC
  Administered 2017-08-21 – 2017-08-22 (×2): 2 via ORAL
  Filled 2017-08-20 (×2): qty 2

## 2017-08-20 MED ORDER — DIPHENHYDRAMINE HCL 25 MG PO CAPS
25.0000 mg | ORAL_CAPSULE | Freq: Four times a day (QID) | ORAL | Status: DC | PRN
  Filled 2017-08-20: qty 1

## 2017-08-20 MED ORDER — SOD CITRATE-CITRIC ACID 500-334 MG/5ML PO SOLN
ORAL | Status: AC
  Administered 2017-08-20: 30 mL via ORAL
  Filled 2017-08-20: qty 15

## 2017-08-20 MED ORDER — EPHEDRINE SULFATE 50 MG/ML IJ SOLN
INTRAMUSCULAR | Status: DC | PRN
  Administered 2017-08-20 (×2): 5 mg via INTRAVENOUS
  Administered 2017-08-20: 10 mg via INTRAVENOUS

## 2017-08-20 MED ORDER — MAGNESIUM HYDROXIDE 400 MG/5ML PO SUSP: 30.0000 mL | ORAL | Status: DC | PRN

## 2017-08-20 MED ORDER — OXYCODONE-ACETAMINOPHEN 5-325 MG PO TABS
1.0000 | ORAL_TABLET | ORAL | Status: DC | PRN
  Administered 2017-08-20 – 2017-08-22 (×7): 1 via ORAL
  Filled 2017-08-20 (×7): qty 1

## 2017-08-20 MED ORDER — SOD CITRATE-CITRIC ACID 500-334 MG/5ML PO SOLN
30.0000 mL | Freq: Once | ORAL | Status: AC
  Administered 2017-08-20: 30 mL via ORAL

## 2017-08-20 MED ORDER — TETANUS-DIPHTH-ACELL PERTUSSIS 5-2.5-18.5 LF-MCG/0.5 IM SUSP
0.5000 mL | Freq: Once | INTRAMUSCULAR | Status: AC
  Administered 2017-08-22: 0.5 mL via INTRAMUSCULAR
  Filled 2017-08-20: qty 0.5

## 2017-08-20 MED ORDER — OXYTOCIN 40 UNITS IN LACTATED RINGERS INFUSION - SIMPLE MED: 2.5000 [IU]/h | INTRAVENOUS | Status: AC

## 2017-08-20 MED ORDER — KETOROLAC TROMETHAMINE 30 MG/ML IJ SOLN: 30.0000 mg | Freq: Four times a day (QID) | INTRAMUSCULAR | Status: AC | PRN

## 2017-08-20 MED ORDER — CEFAZOLIN SODIUM-DEXTROSE 2-4 GM/100ML-% IV SOLN
2.0000 g | INTRAVENOUS | Status: DC
  Filled 2017-08-20: qty 100

## 2017-08-20 MED ORDER — SIMETHICONE 80 MG PO CHEW
80.0000 mg | CHEWABLE_TABLET | ORAL | Status: DC | PRN
  Administered 2017-08-21: 80 mg via ORAL

## 2017-08-20 MED ORDER — NALBUPHINE HCL 10 MG/ML IJ SOLN
5.0000 mg | INTRAMUSCULAR | Status: DC | PRN
  Administered 2017-08-20: 5 mg via INTRAVENOUS
  Filled 2017-08-20: qty 1

## 2017-08-20 MED ORDER — PRENATAL MULTIVITAMIN CH
1.0000 | ORAL_TABLET | Freq: Every day | ORAL | Status: DC
  Administered 2017-08-21 – 2017-08-22 (×2): 1 via ORAL
  Filled 2017-08-20 (×2): qty 1

## 2017-08-20 MED ORDER — SIMETHICONE 80 MG PO CHEW
80.0000 mg | CHEWABLE_TABLET | ORAL | Status: DC
  Administered 2017-08-22: 80 mg via ORAL
  Filled 2017-08-20 (×2): qty 1

## 2017-08-20 MED ORDER — NALOXONE HCL 0.4 MG/ML IJ SOLN: 0.4000 mg | INTRAMUSCULAR | Status: DC | PRN

## 2017-08-20 MED ORDER — ACETAMINOPHEN 500 MG PO TABS
1000.0000 mg | ORAL_TABLET | Freq: Four times a day (QID) | ORAL | Status: AC
  Administered 2017-08-20: 1000 mg via ORAL
  Filled 2017-08-20: qty 2

## 2017-08-20 MED ORDER — ACETAMINOPHEN 325 MG PO TABS
650.0000 mg | ORAL_TABLET | ORAL | Status: DC | PRN
  Administered 2017-08-22: 650 mg via ORAL
  Filled 2017-08-20: qty 2

## 2017-08-20 MED ORDER — SODIUM CHLORIDE 0.9% FLUSH: 3.0000 mL | INTRAVENOUS | Status: DC | PRN

## 2017-08-20 MED ORDER — FENTANYL CITRATE (PF) 100 MCG/2ML IJ SOLN: 25.0000 ug | INTRAMUSCULAR | Status: DC | PRN

## 2017-08-20 MED ORDER — ZOLPIDEM TARTRATE 5 MG PO TABS: 5.0000 mg | ORAL_TABLET | Freq: Every evening | ORAL | Status: DC | PRN

## 2017-08-20 MED ORDER — PROMETHAZINE HCL 25 MG/ML IJ SOLN
25.0000 mg | Freq: Four times a day (QID) | INTRAMUSCULAR | Status: DC | PRN
  Administered 2017-08-20: 25 mg via INTRAVENOUS
  Filled 2017-08-20: qty 1

## 2017-08-20 MED ORDER — LIDOCAINE 5 % EX PTCH
1.0000 | MEDICATED_PATCH | CUTANEOUS | Status: DC
  Filled 2017-08-20: qty 1

## 2017-08-20 MED ORDER — OXYTOCIN 40 UNITS IN LACTATED RINGERS INFUSION - SIMPLE MED
INTRAVENOUS | Status: AC
  Administered 2017-08-20: 06:00:00
  Filled 2017-08-20: qty 1000

## 2017-08-20 MED ORDER — OXYTOCIN 40 UNITS IN LACTATED RINGERS INFUSION - SIMPLE MED
INTRAVENOUS | Status: DC | PRN
  Administered 2017-08-20: 500 mL via INTRAVENOUS

## 2017-08-20 MED ORDER — ONDANSETRON HCL 4 MG/2ML IJ SOLN
4.0000 mg | Freq: Three times a day (TID) | INTRAMUSCULAR | Status: DC | PRN
  Administered 2017-08-20: 4 mg via INTRAVENOUS

## 2017-08-20 MED ORDER — MEASLES, MUMPS & RUBELLA VAC ~~LOC~~ INJ
0.5000 mL | INJECTION | Freq: Once | SUBCUTANEOUS | Status: AC
  Filled 2017-08-20: qty 0.5

## 2017-08-20 MED ORDER — KETOROLAC TROMETHAMINE 30 MG/ML IJ SOLN
INTRAMUSCULAR | Status: AC
  Filled 2017-08-20: qty 1

## 2017-08-20 MED ORDER — OXYCODONE-ACETAMINOPHEN 5-325 MG PO TABS
2.0000 | ORAL_TABLET | ORAL | Status: DC | PRN
  Administered 2017-08-20 – 2017-08-22 (×2): 2 via ORAL
  Filled 2017-08-20 (×2): qty 2

## 2017-08-20 MED ORDER — DIBUCAINE 1 % RE OINT: 1.0000 "application " | TOPICAL_OINTMENT | RECTAL | Status: DC | PRN

## 2017-08-20 MED ORDER — DEXAMETHASONE SODIUM PHOSPHATE 10 MG/ML IJ SOLN
INTRAMUSCULAR | Status: DC | PRN
  Administered 2017-08-20: 10 mg via INTRAVENOUS

## 2017-08-20 MED ORDER — MENTHOL 3 MG MT LOZG
1.0000 | LOZENGE | OROMUCOSAL | Status: DC | PRN
  Filled 2017-08-20: qty 9

## 2017-08-20 MED ORDER — SODIUM CHLORIDE 0.9 % IJ SOLN
INTRAMUSCULAR | Status: AC
  Filled 2017-08-20: qty 10

## 2017-08-20 MED ORDER — MORPHINE SULFATE (PF) 0.5 MG/ML IJ SOLN
INTRAMUSCULAR | Status: DC | PRN
  Administered 2017-08-20: .2 mg via INTRATHECAL

## 2017-08-20 MED ORDER — BUPIVACAINE IN DEXTROSE 0.75-8.25 % IT SOLN
INTRATHECAL | Status: DC | PRN
  Administered 2017-08-20: 1.7 mL via INTRATHECAL

## 2017-08-20 MED ORDER — NALBUPHINE HCL 10 MG/ML IJ SOLN: 5.0000 mg | INTRAMUSCULAR | Status: DC | PRN

## 2017-08-20 MED ORDER — LACTATED RINGERS IV SOLN: INTRAVENOUS | Status: DC

## 2017-08-20 MED ORDER — LACTATED RINGERS IV SOLN
INTRAVENOUS | Status: DC
  Administered 2017-08-20: 03:00:00 via INTRAVENOUS

## 2017-08-20 MED ORDER — FERROUS SULFATE 325 (65 FE) MG PO TABS
325.0000 mg | ORAL_TABLET | Freq: Two times a day (BID) | ORAL | Status: DC
  Administered 2017-08-20 – 2017-08-22 (×4): 325 mg via ORAL
  Filled 2017-08-20 (×4): qty 1

## 2017-08-20 MED ORDER — IBUPROFEN 600 MG PO TABS
600.0000 mg | ORAL_TABLET | Freq: Four times a day (QID) | ORAL | Status: DC
  Administered 2017-08-20 – 2017-08-22 (×7): 600 mg via ORAL
  Filled 2017-08-20 (×7): qty 1

## 2017-08-20 SURGICAL SUPPLY — 28 items
ADH SKN CLS APL DERMABOND .7 (GAUZE/BANDAGES/DRESSINGS) ×1
BAG COUNTER SPONGE EZ (MISCELLANEOUS) ×2 IMPLANT
BAG SPNG 4X4 CLR HAZ (MISCELLANEOUS) ×1
CANISTER SUCT 3000ML PPV (MISCELLANEOUS) ×3 IMPLANT
CHLORAPREP W/TINT 26ML (MISCELLANEOUS) ×6 IMPLANT
COUNTER SPONGE BAG EZ (MISCELLANEOUS) ×1
DERMABOND ADVANCED (GAUZE/BANDAGES/DRESSINGS) ×2
DERMABOND ADVANCED .7 DNX12 (GAUZE/BANDAGES/DRESSINGS) IMPLANT
DRSG TELFA 3X8 NADH (GAUZE/BANDAGES/DRESSINGS) ×3 IMPLANT
ELECT REM PT RETURN 9FT ADLT (ELECTROSURGICAL) ×3
ELECTRODE REM PT RTRN 9FT ADLT (ELECTROSURGICAL) ×1 IMPLANT
GAUZE SPONGE 4X4 12PLY STRL (GAUZE/BANDAGES/DRESSINGS) ×3 IMPLANT
GLOVE BIO SURGEON STRL SZ 6.5 (GLOVE) ×2 IMPLANT
GLOVE BIO SURGEONS STRL SZ 6.5 (GLOVE) ×1
GLOVE INDICATOR 7.0 STRL GRN (GLOVE) ×3 IMPLANT
GOWN STRL REUS W/ TWL LRG LVL3 (GOWN DISPOSABLE) ×2 IMPLANT
GOWN STRL REUS W/TWL LRG LVL3 (GOWN DISPOSABLE) ×6
KIT TURNOVER KIT A (KITS) ×3 IMPLANT
NS IRRIG 1000ML POUR BTL (IV SOLUTION) ×3 IMPLANT
PACK C SECTION AR (MISCELLANEOUS) ×3 IMPLANT
PAD DRESSING TELFA 3X8 NADH (GAUZE/BANDAGES/DRESSINGS) ×1 IMPLANT
PAD OB MATERNITY 4.3X12.25 (PERSONAL CARE ITEMS) ×3 IMPLANT
PAD PREP 24X41 OB/GYN DISP (PERSONAL CARE ITEMS) ×3 IMPLANT
SUT MNCRL AB 4-0 PS2 18 (SUTURE) ×3 IMPLANT
SUT PLAIN 2 0 XLH (SUTURE) IMPLANT
SUT VIC AB 0 CT1 36 (SUTURE) ×12 IMPLANT
SUT VIC AB 3-0 SH 27 (SUTURE) ×3
SUT VIC AB 3-0 SH 27X BRD (SUTURE) ×1 IMPLANT

## 2017-08-20 NOTE — Transfer of Care (Signed)
Immediate Anesthesia Transfer of Care Note  Patient: [name]  Procedure(s) Performed: CESAREAN SECTION (N/A )  Patient Location: Mother/Baby  Anesthesia Type:Spinal  Level of Consciousness: awake, alert  and oriented  Airway & Oxygen Therapy: Patient Spontanous Breathing  Post-op Assessment: Report given to RN and Post -op Vital signs reviewed and stable  Post vital signs: Reviewed  Last Vitals:  Vitals Value Taken Time  BP 116/65 08/20/2017  4:22 AM  Temp 36.2 C 08/20/2017  4:22 AM  Pulse 71 08/20/2017  4:22 AM  Resp 18 08/20/2017  4:22 AM  SpO2 100 % 08/20/2017  4:22 AM    Last Pain:  Vitals:   08/20/17 0057  TempSrc: Oral  PainSc: 0-No pain         Complications: No apparent anesthesia complications

## 2017-08-20 NOTE — Op Note (Signed)
Cesarean Section Procedure Note  Indications: malpresentation: complete breeech, preterm rupture of membranes  Pre-operative Diagnosis: 36 week 5 day pregnancy, severe fetal growth restriction (growth < 4%ile), oligohydramnios, breech presentation.  Post-operative Diagnosis: Same  Surgeon: Hildred Laser, MD  Assistants: Serafina Royals, CNM  Procedure: Primary low transverse Cesarean Section  Anesthesia: Spinal anesthesia  Findings: Female infant, frank breech presentation, 1960 grams, with Apgar scores of 8 at one minute and 9 at five minutes. Intact placenta with 3 vessel cord.  Scant clear amniotic fluid at rupture.  Nuchal cord x 1, reducible. The uterine outline, tubes and ovaries appeared normal.   Procedure Details: The patient was seen in the Holding Room. The risks, benefits, complications, treatment options, and expected outcomes were discussed with the patient.  The patient concurred with the proposed plan, giving informed consent.  The site of surgery properly noted/marked. The patient was taken to the Operating Room, identified as [name] and the procedure verified as C-Section Delivery.   After induction of anesthesia, the patient was draped and prepped in the usual sterile manner.  A Time Out was held and the above information confirmed.  Anesthesia was tested and noted to be adequate. A Pfannenstiel incision was made and carried down through the subcutaneous tissue to the fascia. Fascial incision was made and extended transversely. The fascia was separated from the underlying rectus tissue superiorly and inferiorly. The peritoneum was identified and entered. Peritoneal incision was extended longitudinally. The utero-vesical peritoneal reflection was incised transversely and the bladder flap was bluntly freed from the lower uterine segment. A low transverse uterine incision was made. Delivered from breech (frank) presentation was a 1960 gram Female with Apgar  scores of 8 at one minute and 9 at five minutes.  Nuchal cord x 1, reduced after delivery of the fetal head. The umbilical cord was clamped and cut.  Cord blood was not obtained for evaluation. The placenta was removed intact, however there was an area that was noted to be densely adherent to the uterus.  With removal of the placenta a uterine inversion occurred.  The uterus was everted without difficulty and massage was performed due to atony. Tone was restored to the uterus after approximately 30 seconds of massage.  The uterus was exteriorized and cleared of all clots and debris. The uterine outline, tubes and ovaries appeared normal.  The uterine incision was closed with running locked sutures of 0-Vicryl.  A second suture of 0-Vicryl was used in an imbricating layer.  Hemostasis was observed. Lavage was carried out until clear. The fascia was then reapproximated with a running suture of 0- Vicryl. The subcutaneous fat layer was reapproximated with 3-0 Vicryl. The skin was reapproximated with 4-0 Monocryl.  Instrument, sponge, and needle counts were correct prior the abdominal closure and at the conclusion of the case.   Estimated Blood Loss:  500 ml      Drains: foley catheter to gravity drainage, 200 clear urine at end of the procedure         Total IV Fluids:  900 ml  Specimens: Placenta and Disposition:  Sent to Pathology         Implants: None         Complications:  None; patient tolerated the procedure well.         Disposition: PACU - hemodynamically stable.         Condition: stable     Hildred Laser, MD [facility]

## 2017-08-20 NOTE — Anesthesia Post-op Follow-up Note (Signed)
Anesthesia QCDR form completed.        

## 2017-08-20 NOTE — Clinical Social Work Note (Signed)
DSS CPS report made to [facility]. DSS CPS will follow up.    Ruthe Mannan MSW, LCSWA [phone]

## 2017-08-20 NOTE — Anesthesia Preprocedure Evaluation (Signed)
Anesthesia Evaluation  Patient identified by MRN, date of birth, ID band Patient awake    Reviewed: Allergy & Precautions, NPO status , Patient's Chart, lab work & pertinent test results, reviewed documented beta blocker date and time   Airway Mallampati: II  TM Distance: >3 FB     Dental  (+) Chipped   Pulmonary former smoker,           Cardiovascular      Neuro/Psych PSYCHIATRIC DISORDERS Depression    GI/Hepatic   Endo/Other    Renal/GU      Musculoskeletal   Abdominal   Peds  Hematology   Anesthesia Other Findings MJ use.  Reproductive/Obstetrics                             Anesthesia Physical Anesthesia Plan  ASA: II  Anesthesia Plan: Spinal   Post-op Pain Management:    Induction:   PONV Risk Score and Plan:   Airway Management Planned:   Additional Equipment:   Intra-op Plan:   Post-operative Plan:   Informed Consent: I have reviewed the patients History and Physical, chart, labs and discussed the procedure including the risks, benefits and alternatives for the proposed anesthesia with the patient or authorized representative who has indicated his/her understanding and acceptance.     Plan Discussed with: CRNA  Anesthesia Plan Comments:         Anesthesia Quick Evaluation

## 2017-08-20 NOTE — Clinical Social Work Maternal (Signed)
CLINICAL SOCIAL WORK MATERNAL/CHILD NOTE  Patient Details  Name: [name] MRN: [id number] Date of Birth: January 27, 1996  Date:  08/20/2017  Clinical Social Worker Initiating Note:  Annamaria Boots  Date/Time: Initiated:  08/20/17/1419     Child's Name:  [name]    Biological Parents:  Mother, Father   Need for Interpreter:  None   Reason for Referral:  Current Substance Use/Substance Use During Pregnancy    Address:  [place] [address]    Phone number:  [phone] (home)     Additional phone number:  Household Members/Support Persons (HM/SP):       HM/SP Name Relationship DOB or Age  HM/SP -1        HM/SP -2        HM/SP -3        HM/SP -4        HM/SP -5        HM/SP -6        HM/SP -7        HM/SP -8          Natural Supports (not living in the home):  Immediate Family, Parent, Extended Family, Friends   Chiropodist: None   Employment: Animator   Type of Work:     Education:  Programmer, systems   Homebound arranged:    Museum/gallery curator Resources:  Medicaid   Other Resources:  [facility]   Cultural/Religious Considerations Which May Impact Care:    Strengths:  Ability to meet basic needs , Home prepared for child    Psychotropic Medications:         Pediatrician:       Pediatrician List:   [place]      Pediatrician Fax Number:    Risk Factors/Current Problems:  Substance Use    Cognitive State:  Able to Concentrate , Alert    Mood/Affect:  Calm , Happy    CSW Assessment: CSW received consult for substance use during pregnancy. CSW noted through chart review that patient is positive for Marijuana and baby is also positive for marijuana. CSW met with patient and FOB to discuss concerns. Patient reports that this is her first baby and has a lot of family support. Patient reports that she is currently  receiving medicaid and has applied for [facility] as well. She and FOB both have jobs. Patient reports that she has all necessary supplies for baby at home. She also reports that family is providing a lot of supplies as well. CSW educated patient and FOB about post partum depression and signs to look for. Patient reports that she does not have a history of Depression but would be able to seek help if needed.   CSW explained the concern of marijuana use to patient. Patient states that she used to smoke marijuana a lot but quit 2 months ago because she knew she was pregnant. CSW explained that she and baby tested positive for marijuana and therefore CSW will have to do a CPS report. Patient understood need for CPS but stated that she does not smoke now and will not smoke in front of the baby. CSW explained that CPS will contact her and set up a safety plan and they will determine next steps. Patient expressed understanding and appreciation for letting her know. CSW contacted [facility] to make APS  report. CSW left voicemail and is awaiting call back to make CPS report.   CSW Plan/Description:  Child Protective Service Report     Annamaria Boots, Latanya Presser 08/20/2017, 2:22 PM

## 2017-08-20 NOTE — OB Triage Note (Signed)
Pt arrived to triage with c/o LOF starting around 0000 this am.  Pt denies vaginal bleeding and contractions and states she is feeling baby move normally.  EFM and toco applied and tracing.

## 2017-08-20 NOTE — Anesthesia Procedure Notes (Signed)
Spinal  Patient location during procedure: OR Staffing Anesthesiologist: Luma Clopper, MD Performed: anesthesiologist  Preanesthetic Checklist Completed: patient identified, site marked, surgical consent, pre-op evaluation, timeout performed, IV checked and risks and benefits discussed Spinal Block Patient position: sitting Prep: ChloraPrep Patient monitoring: heart rate, cardiac monitor, continuous pulse ox and blood pressure Approach: midline Location: L3-4 Injection technique: single-shot Needle Needle type: Pencil-Tip  Needle gauge: 25 G Needle length: 9 cm Assessment Sensory level: T10     
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
## 2017-08-21 LAB — CBC
HCT: 29.6 % — ABNORMAL LOW (ref 35.0–47.0)
Hemoglobin: 10.1 g/dL — ABNORMAL LOW (ref 12.0–16.0)
MCH: 34.6 pg — ABNORMAL HIGH (ref 26.0–34.0)
MCHC: 34.3 g/dL (ref 32.0–36.0)
MCV: 100.9 fL — ABNORMAL HIGH (ref 80.0–100.0)
PLATELETS: 192 10*3/uL (ref 150–440)
RBC: 2.93 MIL/uL — ABNORMAL LOW (ref 3.80–5.20)
RDW: 13.1 % (ref 11.5–14.5)
WBC: 16.6 10*3/uL — AB (ref 3.6–11.0)

## 2017-08-21 LAB — RPR: RPR Ser Ql: NONREACTIVE

## 2017-08-21 MED ORDER — HYDROXYZINE HCL 25 MG PO TABS
25.0000 mg | ORAL_TABLET | Freq: Every evening | ORAL | Status: AC | PRN
  Administered 2017-08-22: 25 mg via ORAL
  Filled 2017-08-21: qty 1

## 2017-08-21 MED ORDER — HYDROXYZINE HCL 25 MG PO TABS
25.0000 mg | ORAL_TABLET | Freq: Once | ORAL | Status: AC
  Administered 2017-08-21: 25 mg via ORAL
  Filled 2017-08-21: qty 1
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
## 2017-08-21 NOTE — Anesthesia Postprocedure Evaluation (Signed)
Anesthesia Post Note  Patient: [name]  Procedure(s) Performed: CESAREAN SECTION (N/A )  Patient location during evaluation: [facility] Anesthesia Type: Spinal Level of consciousness: oriented and awake and alert Pain management: pain level controlled Vital Signs Assessment: post-procedure vital signs reviewed and stable Respiratory status: spontaneous breathing, respiratory function stable and patient connected to nasal cannula oxygen Cardiovascular status: blood pressure returned to baseline and stable Postop Assessment: no headache, no backache and no apparent nausea or vomiting Anesthetic complications: no     Last Vitals:  Vitals:   08/20/17 2335 08/21/17 0809  BP: 111/65 110/80  Pulse: (!) 58   Resp: 14 18  Temp: 37 C 36.8 C  SpO2: 99% 99%    Last Pain:  Vitals:   08/21/17 0809  TempSrc: Oral  PainSc:                  Lenard Simmer

## 2017-08-21 NOTE — Progress Notes (Signed)
Patient ID: [name], female   DOB: [birth year], 22 y.o.   MRN: [id number]    Progress Note - Cesarean Delivery  [name] is a 22 y.o. G1P0101 now PP day 1 s/p C-Section, Low Transverse .   Subjective:  Patient reports no problems with eating, bowel movements, voiding, or their wound  Objective:  Vital signs in last 24 hours: Temp:  [98 F (36.7 C)-98.6 F (37 C)] 98.2 F (36.8 C) (05/11 0809) Pulse Rate:  [58-74] 58 (05/10 2335) Resp:  [14-20] 18 (05/11 0809) BP: (101-111)/(51-80) 110/80 (05/11 0809) SpO2:  [98 %-100 %] 99 % (05/11 0809)  Physical Exam:  General: alert, cooperative and no distress Lochia: appropriate Uterine Fundus: firm Incision: healing well DVT Evaluation: No evidence of DVT seen on physical exam.    Data Review Recent Labs    08/20/17 0219 08/21/17 0631  HGB 10.8* 10.1*  HCT 31.0* 29.6*    Assessment:  Principal Problem:   Preterm premature rupture of membranes (PPROM) with onset of labor within 24 hours of rupture in third trimester, antepartum Active Problems:   Cannabis use disorder, moderate, dependence (HCC)   Poor fetal growth affecting management of mother in third trimester   Oligohydramnios   Breech presentation   Status post Cesarean section. Doing well postoperatively.     Plan:       Continue current care.    Elonda Husky, M.D. 08/21/2017 9:47 AM
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
## 2017-08-21 NOTE — Anesthesia Post-op Follow-up Note (Signed)
  Anesthesia Pain Follow-up Note  Patient: [name]  Day #: 1  Date of Follow-up: 08/21/2017 Time: 12:41 PM  Last Vitals:  Vitals:   08/20/17 2335 08/21/17 0809  BP: 111/65 110/80  Pulse: (!) 58   Resp: 14 18  Temp: 37 C 36.8 C  SpO2: 99% 99%    Level of Consciousness: alert  Pain: mild   Side Effects:Pruritis  Catheter Site Exam:clean, dry     Plan: D/C from anesthesia care at surgeon's request  Lenard Simmer

## 2017-08-21 NOTE — Progress Notes (Signed)
Pt made RN aware of redness below incision and above on the left side. RN noted it may be a possible reaction to lidoderm patch that was removed 08/20/2017 at 1954. Pt refused lidoderm patch at this time and stated she would notify RN if redness became worse. Pt also refused Benadryl at this time, but stated she may take it later.

## 2017-08-22 MED ORDER — HYDROXYZINE HCL 25 MG PO TABS
25.0000 mg | ORAL_TABLET | Freq: Once | ORAL | Status: AC
  Administered 2017-08-22: 25 mg via ORAL
  Filled 2017-08-22: qty 1

## 2017-08-22 MED ORDER — OXYCODONE-ACETAMINOPHEN 5-325 MG PO TABS: 1.0000 | ORAL_TABLET | ORAL | 0 refills | Status: DC | PRN

## 2017-08-22 NOTE — Progress Notes (Signed)
Vitals stable and WDL this morning. Fundus firm, bleeding small. Incision approximated with no drainage. Passing flatus. Patient refused scheduled lidoderm patch this morning, stating that the last one gave her a rash. Voiding and tolerating PO foods/fluids well. RN encouraged patient to drink more water, as patient states she doesn't really drink water. Patient has no complaints of uterine pain; states that her back is "a little sore." Pain controlled with oral pain meds. Patient to call RN when ready for percocet.  Patient complaining of some slight soreness in left calf with Homan's sign assessment. No redness or warmth to either calf and vitals stable. Patient states it feels like it could be a "stretching the muscles pain." RN encouraged patient to walk in the hall today and to walk to nursery instead of using wheelchair. Patient agrees with plan of care. Patient to let RN know if any pain continues.

## 2017-08-22 NOTE — Progress Notes (Signed)
Discharge instructions given. Patient verbalizes understanding of teaching. Patient discharged at 1745.

## 2017-08-22 NOTE — Discharge Summary (Signed)
    Physician Obstetric Discharge Summary  Patient ID: Norris Bodley MRN: [id number] DOB/AGE: 08/29/1995 22 y.o.   Date of Admission: 08/20/2017  Date of Discharge: 08/22/2017  Admitting Diagnosis: Premature rupture of membrane at [redacted]w[redacted]d  Mode of Delivery: primary cesarean section       low uterine, transverse     Discharge Diagnosis: No other diagnosis   Intrapartum Procedures:    Post partum procedures:   Complications: none                        Discharge Day SOAP Note:  Subjective:  The patient has no complaints.  She is ambulating well. She is taking PO well. Pain is well controlled with current medications. Patient is urinating without difficulty.   She is passing flatus.    Objective  Vital signs in last 24 hours: BP 110/75 (BP Location: Left Arm)   Pulse 77   Temp 98.1 F (36.7 C) (Oral)   Resp 19   Ht  (1.626 m)   Wt 112 lb (50.8 kg)   LMP 12/06/2016 (Exact Date)   SpO2 100%   Breastfeeding? Unknown   BMI 19.22 kg/m   Physical Exam: Gen: NAD Abdomen:  clean, dry, no drainage Fundus Fundal Tone: Firm  Lochia Amount: Small     Data Review Labs: CBC Latest Ref Rng & Units 08/21/2017 08/20/2017 07/15/2017  WBC 3.6 - 11.0 K/uL 16.6(H) 12.9(H) 16.2(H)  Hemoglobin 12.0 - 16.0 g/dL 10.1(L) 10.8(L) 11.2(L)  Hematocrit 35.0 - 47.0 % 29.6(L) 31.0(L) 32.6(L)  Platelets 150 - 440 K/uL 192 192 216   AB POS  Assessment:  Principal Problem:   Preterm premature rupture of membranes (PPROM) with onset of labor within 24 hours of rupture in third trimester, antepartum Active Problems:   Cannabis use disorder, moderate, dependence (HCC)   Poor fetal growth affecting management of mother in third trimester   Oligohydramnios   Breech presentation   Doing well.  Normal progress as expected.    Plan:  Discharge to home  Modified rest as directed - may slowly resume normal activities with restrictions  as discussed.  Medications as written.  See  below for additional.       Discharge Instructions: Per After Visit Summary. Activity: Advance as tolerated. Pelvic rest for 6 weeks.  Also refer to After Visit Summary.  Wound care discussed. Diet: Regular Medications: Allergies as of 08/22/2017   No Known Allergies     Medication List    TAKE these medications   oxyCODONE-acetaminophen 5-325 MG tablet Commonly known as:  PERCOCET/ROXICET Take 1 tablet by mouth every 4 (four) hours as needed (pain scale 4-7).   Prenatal Vitamins 0.8 MG tablet Take 1 tablet by mouth daily.      Outpatient follow up:  Follow-up Information    Hildred Laser, MD Follow up in 1 week(s).   Specialties:  Obstetrics and Gynecology, Radiology Contact information: [address] [place] [address] [phone]          Postpartum contraception: Will discuss at first post-partum visit.  Discharged Condition: good  Discharged to: home  Newborn Data: Disposition:NICU  Apgars: APGAR (1 MIN): 8   APGAR (5 MINS): 9   APGAR (10 MINS):    Baby Feeding: Bottle  Elonda Husky, M.D. 08/22/2017 10:58 AM

## 2017-08-23 ENCOUNTER — Ambulatory Visit: Payer: Self-pay

## 2017-08-23 LAB — SURGICAL PATHOLOGY

## 2017-08-24 ENCOUNTER — Telehealth: Payer: Self-pay | Admitting: Obstetrics and Gynecology

## 2017-08-24 NOTE — Telephone Encounter (Signed)
Patient called stating she is having some issues after her c section but wouldn't give me any other information. She only wanted to speak with a nurse. Thanks

## 2017-08-25 NOTE — Telephone Encounter (Signed)
Pt was called back. Pt stated that she was having some c-section issues and only speak to  [facility]. Pt was informed that I message would be sent to [facility] that she had called.

## 2017-08-27 ENCOUNTER — Encounter: Payer: Self-pay | Admitting: Obstetrics and Gynecology

## 2017-08-27 ENCOUNTER — Ambulatory Visit (INDEPENDENT_AMBULATORY_CARE_PROVIDER_SITE_OTHER): Payer: Medicaid Other | Admitting: Obstetrics and Gynecology

## 2017-08-27 VITALS — BP 100/68 | HR 87 | Ht 64.0 in | Wt 102.6 lb

## 2017-08-27 DIAGNOSIS — Z3009 Encounter for other general counseling and advice on contraception: Secondary | ICD-10-CM

## 2017-08-27 DIAGNOSIS — Z9889 Other specified postprocedural states: Secondary | ICD-10-CM

## 2017-08-27 DIAGNOSIS — Z98891 History of uterine scar from previous surgery: Secondary | ICD-10-CM

## 2017-08-27 DIAGNOSIS — K5900 Constipation, unspecified: Secondary | ICD-10-CM

## 2017-08-27 MED ORDER — DOCUSATE SODIUM 100 MG PO CAPS: 100.0000 mg | ORAL_CAPSULE | Freq: Two times a day (BID) | ORAL | 2 refills | Status: DC | PRN

## 2017-08-27 NOTE — Progress Notes (Signed)
Pt is present today for a incision check after having a c-section. Pt stated that she doing well with no concerns. Pt stated that the incisions are healing well.

## 2017-08-27 NOTE — Progress Notes (Signed)
    OBSTETRICS/GYNECOLOGY POST-OPERATIVE CLINIC VISIT  Subjective:     [name] is a 22 y.o. female who presents to the clinic 1 weeks status post primary low-transverse C-section for breech presentation, PPROM, and IUGR. Eating a regular diet without difficulty. Bowel movements are normal (but slow to pass, maybe mild constipation). Pain is controlled with current analgesics. Medications being used: prescription NSAID's including ibuprofen (Motrin) and narcotic analgesics including oxycodone/acetaminophen (Percocet, Tylox) (notes taking 1 tablet "every now and then").  Infant is still in hospital due to IUGR.  Feeding has improved.   The following portions of the patient's history were reviewed and updated as appropriate: allergies, current medications, past family history, past medical history, past social history, past surgical history and problem list.  Review of Systems Pertinent items noted in HPI and remainder of comprehensive ROS otherwise negative.    Objective:    BP (!) 102/45   Pulse 81   Wt 168 lb 6.4 oz (76.4 kg)   LMP 12/15/2016   BMI 28.91 kg/m  General:  alert and no distress  Abdomen: soft, bowel sounds active, non-tender  Incision:   healing well, no drainage, no erythema, no hernia, no seroma, no swelling, no dehiscence, incision well approximated    Pathology:  A. PLACENTA; CESAREAN SECTION:  - THIRD TRIMESTER BILOBED PLACENTA WEIGHING 227 G (LESS THAN 10TH  PERCENTILE FOR PROVIDED GESTATIONAL AGE).  - THREE VESSEL UMBILICAL CORD WITH ECCENTRIC INSERTION.  - VILLI WITH VILLITIS AND FEATURES OF ACCELERATED MATURATION.  - NEGATIVE FOR CHORIOAMNIONITIS.  Assessment:    Doing well postoperatively, s/p C-section.  Constipation  Contraception counseling Plan:   1. Continue any current medications.  Prescribed Colace for mild constipation.  2. Wound care discussed.  3. Pathology report discussed. 4. Activity restrictions: no bending, stooping, or squatting,  no lifting more than 10 pounds and pelvic rest x 5 weeks. 5. Anticipated return to work: 5 weeks if applicable. 6. Patient desired to discuss contraceptive options.  Given handout. Notes she is considering Depo Provera but not 100% sure. To discuss further at postpartum visit if still unsure.  7. Follow up: 5 weeks for final postpartum check.     Hildred Laser, MD [facility]

## 2017-10-01 ENCOUNTER — Encounter: Payer: Self-pay | Admitting: Obstetrics and Gynecology

## 2017-10-12 ENCOUNTER — Encounter: Payer: Self-pay | Admitting: Obstetrics and Gynecology

## 2017-10-12 ENCOUNTER — Ambulatory Visit (INDEPENDENT_AMBULATORY_CARE_PROVIDER_SITE_OTHER): Payer: Medicaid Other | Admitting: Obstetrics and Gynecology

## 2017-10-12 DIAGNOSIS — O9081 Anemia of the puerperium: Secondary | ICD-10-CM

## 2017-10-12 DIAGNOSIS — Z30013 Encounter for initial prescription of injectable contraceptive: Secondary | ICD-10-CM

## 2017-10-12 DIAGNOSIS — F39 Unspecified mood [affective] disorder: Secondary | ICD-10-CM

## 2017-10-12 DIAGNOSIS — O906 Postpartum mood disturbance: Secondary | ICD-10-CM

## 2017-10-12 DIAGNOSIS — Z98891 History of uterine scar from previous surgery: Secondary | ICD-10-CM

## 2017-10-12 MED ORDER — MEDROXYPROGESTERONE ACETATE 150 MG/ML IM SUSP: 150.0000 mg | INTRAMUSCULAR | 3 refills | Status: DC

## 2017-10-12 MED ORDER — LAMOTRIGINE 25 MG PO TABS: 25.0000 mg | ORAL_TABLET | Freq: Every day | ORAL | 2 refills | Status: DC

## 2017-10-12 NOTE — Progress Notes (Signed)
Pt is present today for postpartum care. Pt stated that she is doing well. Pt stated that she is not breastfeeding and would like to go back on the depo as a form of bc. Pt stated having sexually intercourse twice since having her baby. No other complaints.  EDS=15

## 2017-10-12 NOTE — Progress Notes (Signed)
OBSTETRICS POSTPARTUM CLINIC PROGRESS NOTE  Subjective:     [name] is a 22 y.o. 181P0101 female who presents for a postpartum visit. She is 6 weeks postpartum following a low cervical transverse Cesarean section. I have fully reviewed the prenatal and intrapartum course. The delivery was at 36 gestational weeks.  Anesthesia: spinal. Postpartum course has been well. Baby's course has been well. Baby is feeding by bottle. Bleeding: patient has/has not resumed menses, with No LMP recorded.. Bowel function is normal. Bladder function is normal. Patient is sexually active (has had unprotected intercourse twice, last episode 4 days ago). Contraception method desired is Depo-Provera injections. Postpartum depression screening: positive, EPDS = 15.  The following portions of the patient's history were reviewed and updated as appropriate: allergies, current medications, past family history, past medical history, past social history, past surgical history and problem list.  Review of Systems Behavioral/Psych: positive for irritability and mood swings, negative for anxiety, depression and sleep disturbance.  Patient notes that she has had a history of mood issues in the past, went to a mood disorder clinic voluntarily for inpatient therapy but notes she was only there for 1-2 days. After this, she notes she has mostly just dealt with her mood changes. Lately she states that she has been very irritable and has noted moderate mood swings (notes that she "snaps" on her boyfriend often) and tearfulness. Also noticed by family members.   Objective:    BP 95/62   Pulse 97   Ht 5\' 4"  (1.626 m)   Wt 94 lb 3.2 oz (42.7 kg)   Breastfeeding? No   BMI 16.17 kg/m   General:  alert and no distress   Breasts:  inspection negative, no nipple discharge or bleeding, no masses or nodularity palpable  Lungs: clear to auscultation bilaterally  Heart:  regular rate and rhythm, S1, S2 normal, no murmur, click,  rub or gallop  Abdomen: soft, non-tender; bowel sounds normal; no masses,  no organomegaly.  Well healed Pfannenstiel incision   Vulva:  normal  Vagina: normal vagina, no discharge, exudate, lesion, or erythema  Cervix:  no cervical motion tenderness and no lesions  Corpus: normal size, contour, position, consistency, mobility, non-tender  Adnexa:  normal adnexa and no mass, fullness, tenderness  Rectal Exam: Not performed.         Labs:  Lab Results  Component Value Date   HGB 10.1 (L) 08/21/2017     Assessment:    Routine postpartum exam.  S/p C-section  Mood disorder  Mild anemia postpartum Contraception management  Plan:   1. Contraception: Depo-Provera injections. Patient with recent coitus. Given option of taking [name] (can take up to 5 days after unprotected sex), or waiting 2 weeks and maintaining abstinence, before receiving her Depo Provera injection.  Patient notes she will maintain abstinence and take a home pregnancy test at the end of 2 weeks. Will f/u in 3 weeks for Depo provera injection.  2. Hgb >10 postpartum, does not need recheck.   3. Mood disorder - discussed options for therapy with or without medication.  Patient notes that she would prefer medication as she has been dealing with this for "a while now" and believes that she should probably have been placed on something even back then, but she checked herself out of the facility. Will start Lamictal 25 mg.  Patient will f/u in 3 weeks for reassessment.  4. Follow up in: 3 weeks or sooner as needed.    [name],  Dolphus Jenny, MD [facility]

## 2017-11-02 ENCOUNTER — Encounter: Payer: Self-pay | Admitting: Obstetrics and Gynecology

## 2017-11-08 ENCOUNTER — Ambulatory Visit (INDEPENDENT_AMBULATORY_CARE_PROVIDER_SITE_OTHER): Payer: Medicaid Other | Admitting: Obstetrics and Gynecology

## 2017-11-08 ENCOUNTER — Encounter: Payer: Self-pay | Admitting: Obstetrics and Gynecology

## 2017-11-08 VITALS — BP 94/56 | HR 88 | Ht 64.0 in | Wt 90.7 lb

## 2017-11-08 DIAGNOSIS — R636 Underweight: Secondary | ICD-10-CM

## 2017-11-08 DIAGNOSIS — F53 Postpartum depression: Secondary | ICD-10-CM | POA: Diagnosis not present

## 2017-11-08 DIAGNOSIS — O99345 Other mental disorders complicating the puerperium: Secondary | ICD-10-CM | POA: Diagnosis not present

## 2017-11-08 DIAGNOSIS — Z308 Encounter for other contraceptive management: Secondary | ICD-10-CM

## 2017-11-08 MED ORDER — SERTRALINE HCL 50 MG PO TABS: 50.0000 mg | ORAL_TABLET | Freq: Every day | ORAL | 11 refills | Status: DC

## 2017-11-08 NOTE — Progress Notes (Addendum)
    GYNECOLOGY PROGRESS NOTE  Subjective:    Patient ID: [name], female    DOB: 01/31/1996, 22 y.o.   MRN: [id number]  HPI  Patient is a 22 y.o. 251P0101 female who presents for follow up of mood changes. Patient was prescribed Lamictal last visit for mood lability, however notes that she was never able to pick it up from her pharmacy as it was initially unavailable and then she had transportation issues getting to the hospital.  On further review of symptoms, patient now reporting mild to moderate anhedonia, sometimes difficulty desiring to take care of herself (but will take care of baby), decreased appetite. Denies SI/HI. EPDS - 17  (increased from score of 15 at last visit).   In addition, patient presenting for Depo Provera injection for contraception. Did not bring medication with her today. Also desires to discuss things that can help her with weight gain.   The following portions of the patient's history were reviewed and updated as appropriate: allergies, current medications, past family history, past medical history, past social history, past surgical history and problem list.  Review of Systems Pertinent items noted in HPI and remainder of comprehensive ROS otherwise negative.   Objective:   Blood pressure (!) 94/56, pulse 88, height 5\' 4"  (1.626 m), weight 90 lb 11.2 oz (41.1 kg), not currently breastfeeding. Body mass index is 15.57 kg/m.  General appearance: alert and no distress Abdomen: soft, non-tender; bowel sounds normal; no masses,  no organomegaly Pelvic: deferred Psychologic: normal speech and thought content. Normal appearing mood.    Assessment:   Postpartum depression Contraception managemeent Underweight  Plan:   - Discussed that symptoms now more resembling postpartum depression as she is still experiencing mood changes in addition to now anhedonia, decreased appetite, and lack of motivation to care for self at times. Discussed that Lamictal may  not be the best option at this time, would likely better be served by an SSRI/SNRI.  Will prescribe Zoloft. Will start at 25 mg and can increase to 50 mg after 2 week if no change in symptoms.  - Contraception: patient desires Depo Provera for contraception.  Did not bring medication with her today. Offered 1 time dose in office, however patient notes that she could not stay. Will return for injection at earliest convenience.  - Underweight: Patient desires to know how to increase weight gain.  Discussed increasing protein intake, carbohydrate load, whole milk intake. Also Depo Provera may help to increase weight gain.  - Patient to f/u in 1 months, will reassess symptoms of depression  A total of 15 minutes were spent face-to-face with the patient during this encounter and over half of that time dealt with counseling and coordination of care.   Hildred Laserherry, Christy Pasion, MD [facility]

## 2017-11-08 NOTE — Patient Instructions (Addendum)
Sertraline tablets What is this medicine? SERTRALINE (SER tra leen) is used to treat depression. It may also be used to treat obsessive compulsive disorder, panic disorder, post-trauma stress, premenstrual dysphoric disorder (PMDD) or social anxiety. This medicine may be used for other purposes; ask your health care provider or pharmacist if you have questions. COMMON BRAND NAME(S): Zoloft What should I tell my health care provider before I take this medicine? They need to know if you have any of these conditions: -bleeding disorders -bipolar disorder or a family history of bipolar disorder -glaucoma -heart disease -high blood pressure -history of irregular heartbeat -history of low levels of calcium, magnesium, or potassium in the blood -if you often drink alcohol -liver disease -receiving electroconvulsive therapy -seizures -suicidal thoughts, plans, or attempt; a previous suicide attempt by you or a family member -take medicines that treat or prevent blood clots -thyroid disease -an unusual or allergic reaction to sertraline, other medicines, foods, dyes, or preservatives -pregnant or trying to get pregnant -breast-feeding How should I use this medicine? Take this medicine by mouth with a glass of water. Follow the directions on the prescription label. You can take it with or without food. Take your medicine at regular intervals. Do not take your medicine more often than directed. Do not stop taking this medicine suddenly except upon the advice of your doctor. Stopping this medicine too quickly may cause serious side effects or your condition may worsen. A special MedGuide will be given to you by the pharmacist with each prescription and refill. Be sure to read this information carefully each time. Talk to your pediatrician regarding the use of this medicine in children. While this drug may be prescribed for children as young as 7 years for selected conditions, precautions do  apply. Overdosage: If you think you have taken too much of this medicine contact a poison control center or emergency room at once. NOTE: This medicine is only for you. Do not share this medicine with others. What if I miss a dose? If you miss a dose, take it as soon as you can. If it is almost time for your next dose, take only that dose. Do not take double or extra doses. What may interact with this medicine? Do not take this medicine with any of the following medications: -cisapride -dofetilide -dronedarone -linezolid -MAOIs like Carbex, Eldepryl, Marplan, Nardil, and Parnate -methylene blue (injected into a vein) -pimozide -thioridazine This medicine may also interact with the following medications: -alcohol -amphetamines -aspirin and aspirin-like medicines -certain medicines for depression, anxiety, or psychotic disturbances -certain medicines for fungal infections like ketoconazole, fluconazole, posaconazole, and itraconazole -certain medicines for irregular heart beat like flecainide, quinidine, propafenone -certain medicines for migraine headaches like almotriptan, eletriptan, frovatriptan, naratriptan, rizatriptan, sumatriptan, zolmitriptan -certain medicines for sleep -certain medicines for seizures like carbamazepine, valproic acid, phenytoin -certain medicines that treat or prevent blood clots like warfarin, enoxaparin, dalteparin -cimetidine -digoxin -diuretics -fentanyl -isoniazid -lithium -NSAIDs, medicines for pain and inflammation, like ibuprofen or naproxen -other medicines that prolong the QT interval (cause an abnormal heart rhythm) -rasagiline -safinamide -supplements like St. John's wort, kava kava, valerian -tolbutamide -tramadol -tryptophan This list may not describe all possible interactions. Give your health care provider a list of all the medicines, herbs, non-prescription drugs, or dietary supplements you use. Also tell them if you smoke, drink  alcohol, or use illegal drugs. Some items may interact with your medicine. What should I watch for while using this medicine? Tell your doctor if your symptoms   do not get better or if they get worse. Visit your doctor or health care professional for regular checks on your progress. Because it may take several weeks to see the full effects of this medicine, it is important to continue your treatment as prescribed by your doctor. Patients and their families should watch out for new or worsening thoughts of suicide or depression. Also watch out for sudden changes in feelings such as feeling anxious, agitated, panicky, irritable, hostile, aggressive, impulsive, severely restless, overly excited and hyperactive, or not being able to sleep. If this happens, especially at the beginning of treatment or after a change in dose, call your health care professional. Bonita QuinYou may get drowsy or dizzy. Do not drive, use machinery, or do anything that needs mental alertness until you know how this medicine affects you. Do not stand or sit up quickly, especially if you are an older patient. This reduces the risk of dizzy or fainting spells. Alcohol may interfere with the effect of this medicine. Avoid alcoholic drinks. Your mouth may get dry. Chewing sugarless gum or sucking hard candy, and drinking plenty of water may help. Contact your doctor if the problem does not go away or is severe. What side effects may I notice from receiving this medicine? Side effects that you should report to your doctor or health care professional as soon as possible: -allergic reactions like skin rash, itching or hives, swelling of the face, lips, or tongue -anxious -black, tarry stools -changes in vision -confusion -elevated mood, decreased need for sleep, racing thoughts, impulsive behavior -eye pain -fast, irregular heartbeat -feeling faint or lightheaded, falls -feeling agitated, angry, or irritable -hallucination, loss of contact with  reality -loss of balance or coordination -loss of memory -painful or prolonged erections -restlessness, pacing, inability to keep still -seizures -stiff muscles -suicidal thoughts or other mood changes -trouble sleeping -unusual bleeding or bruising -unusually weak or tired -vomiting Side effects that usually do not require medical attention (report to your doctor or health care professional if they continue or are bothersome): -change in appetite or weight -change in sex drive or performance -diarrhea -increased sweating -indigestion, nausea -tremors This list may not describe all possible side effects. Call your doctor for medical advice about side effects. You may report side effects to FDA at 1-800-FDA-1088. Where should I keep my medicine? Keep out of the reach of children. Store at room temperature between 15 and 30 degrees C (59 and 86 degrees F). Throw away any unused medicine after the expiration date. NOTE: This sheet is a summary. It may not cover all possible information. If you have questions about this medicine, talk to your doctor, pharmacist, or health care provider.  2018 Elsevier/Gold Standard (2016-04-03 14:17:49)     Medroxyprogesterone injection [Contraceptive] What is this medicine? MEDROXYPROGESTERONE (me DROX ee proe JES te rone) contraceptive injections prevent pregnancy. They provide effective birth control for 3 months. Depo-subQ Provera 104 is also used for treating pain related to endometriosis. This medicine may be used for other purposes; ask your health care provider or pharmacist if you have questions. COMMON BRAND NAME(S): Depo-Provera, Depo-subQ Provera 104 What should I tell my health care provider before I take this medicine? They need to know if you have any of these conditions: -frequently drink alcohol -asthma -blood vessel disease or a history of a blood clot in the lungs or legs -bone disease such as osteoporosis -breast  cancer -diabetes -eating disorder (anorexia nervosa or bulimia) -high blood pressure -HIV infection or AIDS -  kidney disease -liver disease -mental depression -migraine -seizures (convulsions) -stroke -tobacco smoker -vaginal bleeding -an unusual or allergic reaction to medroxyprogesterone, other hormones, medicines, foods, dyes, or preservatives -pregnant or trying to get pregnant -breast-feeding How should I use this medicine? Depo-Provera Contraceptive injection is given into a muscle. Depo-subQ Provera 104 injection is given under the skin. These injections are given by a health care professional. You must not be pregnant before getting an injection. The injection is usually given during the first 5 days after the start of a menstrual period or 6 weeks after delivery of a baby. Talk to your pediatrician regarding the use of this medicine in children. Special care may be needed. These injections have been used in female children who have started having menstrual periods. Overdosage: If you think you have taken too much of this medicine contact a poison control center or emergency room at once. NOTE: This medicine is only for you. Do not share this medicine with others. What if I miss a dose? Try not to miss a dose. You must get an injection once every 3 months to maintain birth control. If you cannot keep an appointment, call and reschedule it. If you wait longer than 13 weeks between Depo-Provera contraceptive injections or longer than 14 weeks between Depo-subQ Provera 104 injections, you could get pregnant. Use another method for birth control if you miss your appointment. You may also need a pregnancy test before receiving another injection. What may interact with this medicine? Do not take this medicine with any of the following medications: -bosentan This medicine may also interact with the following medications: -aminoglutethimide -antibiotics or medicines for infections,  especially rifampin, rifabutin, rifapentine, and griseofulvin -aprepitant -barbiturate medicines such as phenobarbital or primidone -bexarotene -carbamazepine -medicines for seizures like ethotoin, felbamate, oxcarbazepine, phenytoin, topiramate -modafinil -St. John's wort This list may not describe all possible interactions. Give your health care provider a list of all the medicines, herbs, non-prescription drugs, or dietary supplements you use. Also tell them if you smoke, drink alcohol, or use illegal drugs. Some items may interact with your medicine. What should I watch for while using this medicine? This drug does not protect you against HIV infection (AIDS) or other sexually transmitted diseases. Use of this product may cause you to lose calcium from your bones. Loss of calcium may cause weak bones (osteoporosis). Only use this product for more than 2 years if other forms of birth control are not right for you. The longer you use this product for birth control the more likely you will be at risk for weak bones. Ask your health care professional how you can keep strong bones. You may have a change in bleeding pattern or irregular periods. Many females stop having periods while taking this drug. If you have received your injections on time, your chance of being pregnant is very low. If you think you may be pregnant, see your health care professional as soon as possible. Tell your health care professional if you want to get pregnant within the next year. The effect of this medicine may last a long time after you get your last injection. What side effects may I notice from receiving this medicine? Side effects that you should report to your doctor or health care professional as soon as possible: -allergic reactions like skin rash, itching or hives, swelling of the face, lips, or tongue -breast tenderness or discharge -breathing problems -changes in vision -depression -feeling faint or  lightheaded, falls -fever -pain in the  abdomen, chest, groin, or leg -problems with balance, talking, walking -unusually weak or tired -yellowing of the eyes or skin Side effects that usually do not require medical attention (report to your doctor or health care professional if they continue or are bothersome): -acne -fluid retention and swelling -headache -irregular periods, spotting, or absent periods -temporary pain, itching, or skin reaction at site where injected -weight gain This list may not describe all possible side effects. Call your doctor for medical advice about side effects. You may report side effects to FDA at 1-800-FDA-1088. Where should I keep my medicine? This does not apply. The injection will be given to you by a health care professional. NOTE: This sheet is a summary. It may not cover all possible information. If you have questions about this medicine, talk to your doctor, pharmacist, or health care provider.  2018 Elsevier/Gold Standard (2008-04-20 18:37:56)

## 2017-11-08 NOTE — Progress Notes (Signed)
Pt stated that she have not picked up lamotrigine and is still mood swings.

## 2019-03-17 ENCOUNTER — Ambulatory Visit: Payer: Self-pay

## 2019-03-20 ENCOUNTER — Ambulatory Visit: Payer: Self-pay

## 2019-03-23 ENCOUNTER — Emergency Department
Admission: EM | Admit: 2019-03-23 | Discharge: 2019-03-23 | Disposition: A | Discharge status: Home / Self Care | Payer: Medicaid Other | Attending: Student | Admitting: Student

## 2019-03-23 ENCOUNTER — Emergency Department: Payer: Medicaid Other

## 2019-03-23 ENCOUNTER — Other Ambulatory Visit: Payer: Self-pay

## 2019-03-23 ENCOUNTER — Encounter: Payer: Self-pay | Admitting: Intensive Care

## 2019-03-23 DIAGNOSIS — F17210 Nicotine dependence, cigarettes, uncomplicated: Secondary | ICD-10-CM | POA: Insufficient documentation

## 2019-03-23 DIAGNOSIS — R300 Dysuria: Secondary | ICD-10-CM | POA: Insufficient documentation

## 2019-03-23 DIAGNOSIS — Z79899 Other long term (current) drug therapy: Secondary | ICD-10-CM | POA: Diagnosis not present

## 2019-03-23 DIAGNOSIS — N390 Urinary tract infection, site not specified: Secondary | ICD-10-CM

## 2019-03-23 DIAGNOSIS — R350 Frequency of micturition: Secondary | ICD-10-CM | POA: Diagnosis present

## 2019-03-23 DIAGNOSIS — R109 Unspecified abdominal pain: Secondary | ICD-10-CM | POA: Insufficient documentation

## 2019-03-23 DIAGNOSIS — N12 Tubulo-interstitial nephritis, not specified as acute or chronic: Secondary | ICD-10-CM

## 2019-03-23 LAB — COMPREHENSIVE METABOLIC PANEL
ALT: 9 U/L (ref 0–44)
AST: 12 U/L — ABNORMAL LOW (ref 15–41)
Albumin: 4.2 g/dL (ref 3.5–5.0)
Alkaline Phosphatase: 80 U/L (ref 38–126)
Anion gap: 8 (ref 5–15)
BUN: 16 mg/dL (ref 6–20)
CO2: 25 mmol/L (ref 22–32)
Calcium: 9.1 mg/dL (ref 8.9–10.3)
Chloride: 104 mmol/L (ref 98–111)
Creatinine, Ser: 0.68 mg/dL (ref 0.44–1.00)
GFR calc Af Amer: >60 mL/min (ref >60)
GFR calc non Af Amer: >60 mL/min (ref >60)
Glucose, Bld: 52 mg/dL — ABNORMAL LOW (ref 70–99)
Potassium: 3.8 mmol/L (ref 3.5–5.1)
Sodium: 137 mmol/L (ref 135–145)
Total Bilirubin: 0.4 mg/dL (ref 0.3–1.2)
Total Protein: 7.5 g/dL (ref 6.5–8.1)

## 2019-03-23 LAB — URINALYSIS, COMPLETE (UACMP) WITH MICROSCOPIC
Bilirubin Urine: NEGATIVE
Glucose, UA: NEGATIVE mg/dL
Ketones, ur: NEGATIVE mg/dL
Nitrite: NEGATIVE
Protein, ur: 100 mg/dL — AB
RBC / HPF: >50 RBC/hpf — ABNORMAL HIGH (ref 0–5)
Specific Gravity, Urine: 1.013 (ref 1.005–1.030)
WBC, UA: >50 WBC/hpf — ABNORMAL HIGH (ref 0–5)
pH: 5 (ref 5.0–8.0)

## 2019-03-23 LAB — CBC
HCT: 39.3 % (ref 36.0–46.0)
Hemoglobin: 13.3 g/dL (ref 12.0–15.0)
MCH: 32.4 pg (ref 26.0–34.0)
MCHC: 33.8 g/dL (ref 30.0–36.0)
MCV: 95.6 fL (ref 80.0–100.0)
Platelets: 236 10*3/uL (ref 150–400)
RBC: 4.11 MIL/uL (ref 3.87–5.11)
RDW: 12.1 % (ref 11.5–15.5)
WBC: 15.4 10*3/uL — ABNORMAL HIGH (ref 4.0–10.5)
nRBC: 0 % (ref 0.0–0.2)

## 2019-03-23 LAB — POCT PREGNANCY, URINE: Preg Test, Ur: NEGATIVE

## 2019-03-23 LAB — LIPASE, BLOOD: Lipase: 33 U/L (ref 11–51)

## 2019-03-23 MED ORDER — SODIUM CHLORIDE 0.9 % IV SOLN
1.0000 g | Freq: Once | INTRAVENOUS | Status: AC
  Administered 2019-03-23: 1 g via INTRAVENOUS
  Filled 2019-03-23: qty 10

## 2019-03-23 MED ORDER — SODIUM CHLORIDE 0.9 % IV BOLUS
1000.0000 mL | Freq: Once | INTRAVENOUS | Status: AC
  Administered 2019-03-23: 1000 mL via INTRAVENOUS

## 2019-03-23 MED ORDER — CEFDINIR 300 MG PO CAPS: 300.0000 mg | ORAL_CAPSULE | Freq: Two times a day (BID) | ORAL | 0 refills | Status: AC

## 2019-03-23 NOTE — ED Triage Notes (Signed)
Patient c/o right sided abd pain that started last night with urinary frequency, little results and burning

## 2019-03-23 NOTE — Discharge Instructions (Signed)
Thank you for letting us take care of you in the emergency department today.   Please continue to take any regular, prescribed medications.  Please be sure to stay well-hydrated.  New medications we have prescribed:  - Omnicef (cefdinir) - an antibiotic for your urine/kidney infection.  Please follow up with: - Your primary care doctor to review your ER visit and follow up on your symptoms.   Please return to the ER for any new or worsening symptoms, especially if you develop high fevers, worsening pain, nausea, vomiting, cannot keep down your medications.
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
## 2019-04-13 ENCOUNTER — Other Ambulatory Visit: Payer: Self-pay

## 2019-04-13 ENCOUNTER — Encounter: Payer: Self-pay | Admitting: Family Medicine

## 2019-04-13 ENCOUNTER — Ambulatory Visit: Payer: Medicaid Other | Admitting: Family Medicine

## 2019-04-13 DIAGNOSIS — Z113 Encounter for screening for infections with a predominantly sexual mode of transmission: Secondary | ICD-10-CM | POA: Diagnosis not present

## 2019-04-13 DIAGNOSIS — R102 Pelvic and perineal pain: Secondary | ICD-10-CM

## 2019-04-13 DIAGNOSIS — Z3169 Encounter for other general counseling and advice on procreation: Secondary | ICD-10-CM

## 2019-04-13 LAB — WET PREP FOR TRICH, YEAST, CLUE
Trichomonas Exam: NEGATIVE
Yeast Exam: NEGATIVE

## 2019-04-13 MED ORDER — MULTI-VITAMIN/MINERALS PO TABS: 1.0000 | ORAL_TABLET | Freq: Every day | ORAL | 0 refills | Status: DC

## 2019-04-13 NOTE — Progress Notes (Signed)
Here today for STD screening. Declines bloodwork. Tharun Cappella, RN ? ?

## 2019-04-13 NOTE — Progress Notes (Signed)
Reviewed wet mount results. Per standing orders no treatment indicated. Hal Morales, RN

## 2019-04-13 NOTE — Progress Notes (Signed)
[facility] Department STI clinic/screening visit  Subjective:  [name] is a 23 y.o. female being seen today for  Chief Complaint  Patient presents with  . SEXUALLY TRANSMITTED DISEASE     The patient reports they do have symptoms. Patient reports that they unsure if they desire a pregnancy in the next year. They reported they are not interested in discussing contraception today.   Patient has the following medical conditions:   Patient Active Problem List   Diagnosis Date Noted  . MDD (major depressive disorder), single episode, moderate ([facility]) 09/18/2016  . Deliberate self-cutting 09/17/2016  . Tobacco use disorder 09/17/2016  . Cannabis use disorder, moderate, dependence ([facility]) 09/17/2016    HPI  Pt is here for STI testing. She reports she has been having pain with sex, as well as discharge for past 3 weeks. Endorses having sex 2-3 times per day, has been painful recently with insertion of penis at base of vaginal introitus. She has 1 partner, states she is comfortable in her relationship and all sex is consensual.   She is considering becoming pregnant in the next year.   See flowsheet for further details and programmatic requirements.    Patient's last menstrual period was 03/26/2019 (approximate). Last sex: today BCM: no Desires EC? Pt declines  No components found for: HCV  The following portions of the patient's history were reviewed and updated as appropriate: allergies, current medications, past medical history, past social history, past surgical history and problem list.  Objective:  There were no vitals filed for this visit.   Physical Exam Vitals and nursing note reviewed.  Constitutional:      Appearance: Normal appearance.  HENT:     Head: Normocephalic and atraumatic.     Mouth/Throat:     Mouth: Mucous membranes are moist.     Pharynx: Oropharynx is clear. No oropharyngeal exudate or posterior oropharyngeal erythema.  Pulmonary:      Effort: Pulmonary effort is normal.  Abdominal:     General: Abdomen is flat.     Palpations: There is no mass.     Tenderness: There is no abdominal tenderness. There is no rebound.  Genitourinary:    General: Normal vulva.     Exam position: Lithotomy position.     Pubic Area: No rash or pubic lice.      Labia:        Right: No rash, tenderness or lesion.        Left: No rash, tenderness or lesion.      Vagina: Foreign body (small clump of hair found) present. No vaginal discharge, erythema, bleeding or lesions.     Cervix: No cervical motion tenderness, discharge, friability, lesion or erythema.     Uterus: Normal.      Adnexa: Right adnexa normal and left adnexa normal.     Rectum: Normal.     Comments: Tenderness at base of vaginal introitus. No erythema or lesions. Lymphadenopathy:     Head:     Right side of head: No preauricular or posterior auricular adenopathy.     Left side of head: No preauricular or posterior auricular adenopathy.     Cervical: No cervical adenopathy.     Upper Body:     Right upper body: No supraclavicular or axillary adenopathy.     Left upper body: No supraclavicular or axillary adenopathy.     Lower Body: No right inguinal adenopathy. No left inguinal adenopathy.  Skin:    General: Skin is  warm and dry.     Findings: No rash.  Neurological:     Mental Status: She is alert and oriented to person, place, and time.      Assessment and Plan:  [name] is a 23 y.o. female presenting to the [facility] Department for STI screening    1. Screening examination for venereal disease -Screenings today as below. Treat wet prep per standing order. -Patient does not meet criteria for HepB, HepC Screening. Declines HIV and syphilis screenings. -Counseled on warning s/sx and when to seek care. Recommended condom use with all sex and discussed importance of condom use for STI prevention. - WET PREP FOR [place], [facility], [facility]  2. Encounter for preconception consultation -Preconception counseling today: -Advised to begin taking PNV w/folic acid.  -Encouraged regular exercise and healthy diet w/plenty of fruits and vegetables.  -We reviewed their current problems and medications in terms of pregnancy safety. She has quit smoking, working on quitting vaping. -Immunizations up to date. -We discussed fertility awareness, when to take a pregnancy test, and general early pregnancy precautions.   -Reviewed services at the office regarding prenatal care.   3. Vulvar pain -Exam today with tenderness at base of vulva/vaginal introitus. As this is a recent development coinciding with increased frequency of sex I suggested pelvic rest from sex x 1wk, slow return to sexual activity with plenty of lubrication, and to f/u with PCP or ob/gyn if persists.    No follow-ups on file.  No future appointments.  Kandee Keen, PA-C

## 2019-04-22 ENCOUNTER — Other Ambulatory Visit: Payer: Self-pay

## 2019-04-22 ENCOUNTER — Encounter: Payer: Self-pay | Admitting: Emergency Medicine

## 2019-04-22 ENCOUNTER — Emergency Department
Admission: EM | Admit: 2019-04-22 | Discharge: 2019-04-22 | Disposition: A | Discharge status: Home / Self Care | Payer: Medicaid Other | Attending: Student | Admitting: Student

## 2019-04-22 ENCOUNTER — Emergency Department: Payer: Medicaid Other

## 2019-04-22 DIAGNOSIS — F17290 Nicotine dependence, other tobacco product, uncomplicated: Secondary | ICD-10-CM | POA: Insufficient documentation

## 2019-04-22 DIAGNOSIS — R0602 Shortness of breath: Secondary | ICD-10-CM | POA: Insufficient documentation

## 2019-04-22 DIAGNOSIS — J029 Acute pharyngitis, unspecified: Secondary | ICD-10-CM | POA: Diagnosis present

## 2019-04-22 DIAGNOSIS — R05 Cough: Secondary | ICD-10-CM | POA: Insufficient documentation

## 2019-04-22 DIAGNOSIS — Z20822 Contact with and (suspected) exposure to covid-19: Secondary | ICD-10-CM | POA: Diagnosis not present

## 2019-04-22 LAB — GROUP A STREP BY PCR: Group A Strep by PCR: NOT DETECTED
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
## 2019-04-22 NOTE — Discharge Instructions (Addendum)
Follow-up with your regular doctor or return emergency department if not improving or worsening.  Take Tylenol or ibuprofen as needed.  Your Covid results should result in 6 to 24 hours.  Sign up for my chart so that you will be able to see your test result immediately.  If negative return to normal activities.  If positive you must quarantine for an additional 10 days.

## 2019-04-22 NOTE — ED Triage Notes (Addendum)
Pt arrived via POV with reports of sore throat x 4 days, cough. Chills, sore throat. Pt reports episode of shortness of breath last night. Today pt is speaking in complete sentences without running out of breath.  Pt denies any COVID + contacts, pt states she has been to several different hospitals recently with her daughter.

## 2019-04-22 NOTE — ED Notes (Signed)
First Nurse Note: Pt c/o COVID like sx's. Pt is in NAD.

## 2019-04-23 ENCOUNTER — Telehealth: Payer: Self-pay | Admitting: General Practice

## 2019-04-23 LAB — SARS CORONAVIRUS 2 (TAT 6-24 HRS): SARS Coronavirus 2: NEGATIVE

## 2019-04-23 NOTE — Telephone Encounter (Signed)
Negative COVID results given. Patient results "NOT Detected." Caller expressed understanding. ° °

## 2019-06-01 ENCOUNTER — Emergency Department: Payer: Medicaid Other

## 2019-06-01 ENCOUNTER — Inpatient Hospital Stay
Admission: EM | Admit: 2019-06-01 | Discharge: 2019-06-03 | DRG: 690 | Disposition: A | Discharge status: Home / Self Care | Payer: Medicaid Other | Attending: Internal Medicine | Admitting: Internal Medicine

## 2019-06-01 ENCOUNTER — Other Ambulatory Visit: Payer: Self-pay

## 2019-06-01 ENCOUNTER — Encounter: Payer: Self-pay | Admitting: Emergency Medicine

## 2019-06-01 DIAGNOSIS — A419 Sepsis, unspecified organism: Secondary | ICD-10-CM

## 2019-06-01 DIAGNOSIS — Z803 Family history of malignant neoplasm of breast: Secondary | ICD-10-CM

## 2019-06-01 DIAGNOSIS — N136 Pyonephrosis: Secondary | ICD-10-CM

## 2019-06-01 DIAGNOSIS — N10 Acute tubulo-interstitial nephritis: Secondary | ICD-10-CM | POA: Diagnosis present

## 2019-06-01 DIAGNOSIS — Z8249 Family history of ischemic heart disease and other diseases of the circulatory system: Secondary | ICD-10-CM | POA: Diagnosis not present

## 2019-06-01 DIAGNOSIS — Z833 Family history of diabetes mellitus: Secondary | ICD-10-CM

## 2019-06-01 DIAGNOSIS — N12 Tubulo-interstitial nephritis, not specified as acute or chronic: Secondary | ICD-10-CM

## 2019-06-01 DIAGNOSIS — R109 Unspecified abdominal pain: Secondary | ICD-10-CM | POA: Diagnosis not present

## 2019-06-01 DIAGNOSIS — Z87891 Personal history of nicotine dependence: Secondary | ICD-10-CM

## 2019-06-01 DIAGNOSIS — Z20822 Contact with and (suspected) exposure to covid-19: Secondary | ICD-10-CM | POA: Diagnosis present

## 2019-06-01 DIAGNOSIS — Z8349 Family history of other endocrine, nutritional and metabolic diseases: Secondary | ICD-10-CM | POA: Diagnosis not present

## 2019-06-01 DIAGNOSIS — E876 Hypokalemia: Secondary | ICD-10-CM | POA: Diagnosis not present

## 2019-06-01 LAB — CBC
HCT: 36 % (ref 36.0–46.0)
HCT: 29.5 % — ABNORMAL LOW (ref 36.0–46.0)
Hemoglobin: 12 g/dL (ref 12.0–15.0)
Hemoglobin: 9.9 g/dL — ABNORMAL LOW (ref 12.0–15.0)
MCH: 32 pg (ref 26.0–34.0)
MCH: 31.9 pg (ref 26.0–34.0)
MCHC: 33.3 g/dL (ref 30.0–36.0)
MCHC: 33.6 g/dL (ref 30.0–36.0)
MCV: 96 fL (ref 80.0–100.0)
MCV: 95.2 fL (ref 80.0–100.0)
Platelets: 216 10*3/uL (ref 150–400)
Platelets: 186 10*3/uL (ref 150–400)
RBC: 3.75 MIL/uL — ABNORMAL LOW (ref 3.87–5.11)
RBC: 3.1 MIL/uL — ABNORMAL LOW (ref 3.87–5.11)
RDW: 13.4 % (ref 11.5–15.5)
RDW: 13.3 % (ref 11.5–15.5)
WBC: 13.9 10*3/uL — ABNORMAL HIGH (ref 4.0–10.5)
WBC: 15.7 10*3/uL — ABNORMAL HIGH (ref 4.0–10.5)
nRBC: 0 % (ref 0.0–0.2)
nRBC: 0 % (ref 0.0–0.2)

## 2019-06-01 LAB — CREATININE, SERUM
Creatinine, Ser: 0.51 mg/dL (ref 0.44–1.00)
GFR calc Af Amer: >60 mL/min (ref >60)
GFR calc non Af Amer: >60 mL/min (ref >60)

## 2019-06-01 LAB — URINALYSIS, COMPLETE (UACMP) WITH MICROSCOPIC
Bilirubin Urine: NEGATIVE
Glucose, UA: 50 mg/dL — AB
Ketones, ur: NEGATIVE mg/dL
Nitrite: NEGATIVE
Protein, ur: 100 mg/dL — AB
RBC / HPF: >50 RBC/hpf — ABNORMAL HIGH (ref 0–5)
Specific Gravity, Urine: 1.012 (ref 1.005–1.030)
WBC, UA: >50 WBC/hpf — ABNORMAL HIGH (ref 0–5)
pH: 6 (ref 5.0–8.0)

## 2019-06-01 LAB — COMPREHENSIVE METABOLIC PANEL
ALT: 10 U/L (ref 0–44)
AST: 18 U/L (ref 15–41)
Albumin: 3.8 g/dL (ref 3.5–5.0)
Alkaline Phosphatase: 63 U/L (ref 38–126)
Anion gap: 10 (ref 5–15)
BUN: 9 mg/dL (ref 6–20)
CO2: 22 mmol/L (ref 22–32)
Calcium: 9 mg/dL (ref 8.9–10.3)
Chloride: 103 mmol/L (ref 98–111)
Creatinine, Ser: 0.68 mg/dL (ref 0.44–1.00)
GFR calc Af Amer: >60 mL/min (ref >60)
GFR calc non Af Amer: >60 mL/min (ref >60)
Glucose, Bld: 181 mg/dL — ABNORMAL HIGH (ref 70–99)
Potassium: 3.8 mmol/L (ref 3.5–5.1)
Sodium: 135 mmol/L (ref 135–145)
Total Bilirubin: 0.6 mg/dL (ref 0.3–1.2)
Total Protein: 7.5 g/dL (ref 6.5–8.1)

## 2019-06-01 LAB — LACTIC ACID, PLASMA: Lactic Acid, Venous: 1.9 mmol/L (ref 0.5–1.9)

## 2019-06-01 LAB — LIPASE, BLOOD: Lipase: 25 U/L (ref 11–51)

## 2019-06-01 LAB — POCT PREGNANCY, URINE: Preg Test, Ur: NEGATIVE

## 2019-06-01 MED ORDER — SODIUM CHLORIDE 0.9 % IV SOLN: 1.0000 g | INTRAVENOUS | Status: DC

## 2019-06-01 MED ORDER — ACETAMINOPHEN 650 MG RE SUPP: 650.0000 mg | Freq: Four times a day (QID) | RECTAL | Status: DC | PRN

## 2019-06-01 MED ORDER — PIPERACILLIN-TAZOBACTAM 3.375 G IVPB
3.3750 g | Freq: Three times a day (TID) | INTRAVENOUS | Status: DC
  Administered 2019-06-02 – 2019-06-03 (×4): 3.375 g via INTRAVENOUS
  Filled 2019-06-01 (×4): qty 50

## 2019-06-01 MED ORDER — ONDANSETRON HCL 4 MG/2ML IJ SOLN: 4.0000 mg | Freq: Four times a day (QID) | INTRAMUSCULAR | Status: DC | PRN

## 2019-06-01 MED ORDER — FAMOTIDINE IN NACL 20-0.9 MG/50ML-% IV SOLN
40.0000 mg | Freq: Once | INTRAVENOUS | Status: AC
  Administered 2019-06-01: 40 mg via INTRAVENOUS
  Filled 2019-06-01: qty 100

## 2019-06-01 MED ORDER — SODIUM CHLORIDE 0.9% FLUSH: 3.0000 mL | Freq: Once | INTRAVENOUS | Status: DC

## 2019-06-01 MED ORDER — IBUPROFEN 600 MG PO TABS
600.0000 mg | ORAL_TABLET | Freq: Once | ORAL | Status: AC
  Administered 2019-06-01: 600 mg via ORAL
  Filled 2019-06-01: qty 1

## 2019-06-01 MED ORDER — LEVOFLOXACIN 750 MG PO TABS: 750.0000 mg | ORAL_TABLET | Freq: Every day | ORAL | 0 refills | Status: DC

## 2019-06-01 MED ORDER — ENOXAPARIN SODIUM 40 MG/0.4ML ~~LOC~~ SOLN
30.0000 mg | SUBCUTANEOUS | Status: DC
  Administered 2019-06-02: 30 mg via SUBCUTANEOUS
  Filled 2019-06-01 (×2): qty 0.4

## 2019-06-01 MED ORDER — ACETAMINOPHEN 325 MG PO TABS
650.0000 mg | ORAL_TABLET | Freq: Once | ORAL | Status: AC
  Administered 2019-06-01: 650 mg via ORAL

## 2019-06-01 MED ORDER — PIPERACILLIN-TAZOBACTAM 3.375 G IVPB 30 MIN
3.3750 g | Freq: Four times a day (QID) | INTRAVENOUS | Status: DC
  Administered 2019-06-01: 3.375 g via INTRAVENOUS
  Filled 2019-06-01: qty 50

## 2019-06-01 MED ORDER — SODIUM CHLORIDE 0.9 % IV SOLN: INTRAVENOUS | Status: DC

## 2019-06-01 MED ORDER — KETOROLAC TROMETHAMINE 30 MG/ML IJ SOLN
30.0000 mg | Freq: Four times a day (QID) | INTRAMUSCULAR | Status: DC | PRN
  Administered 2019-06-02 (×2): 30 mg via INTRAVENOUS
  Filled 2019-06-01 (×2): qty 1

## 2019-06-01 MED ORDER — ENOXAPARIN SODIUM 40 MG/0.4ML ~~LOC~~ SOLN: 40.0000 mg | SUBCUTANEOUS | Status: DC

## 2019-06-01 MED ORDER — ONDANSETRON HCL 4 MG PO TABS
4.0000 mg | ORAL_TABLET | Freq: Four times a day (QID) | ORAL | Status: DC | PRN
  Administered 2019-06-03: 4 mg via ORAL
  Filled 2019-06-01: qty 1

## 2019-06-01 MED ORDER — IOHEXOL 300 MG/ML  SOLN
75.0000 mL | Freq: Once | INTRAMUSCULAR | Status: AC | PRN
  Administered 2019-06-01: 75 mL via INTRAVENOUS
  Filled 2019-06-01: qty 75

## 2019-06-01 MED ORDER — LEVOFLOXACIN IN D5W 750 MG/150ML IV SOLN
750.0000 mg | Freq: Once | INTRAVENOUS | Status: AC
  Administered 2019-06-01: 750 mg via INTRAVENOUS
  Filled 2019-06-01: qty 150

## 2019-06-01 MED ORDER — ACETAMINOPHEN 325 MG PO TABS
650.0000 mg | ORAL_TABLET | Freq: Four times a day (QID) | ORAL | Status: DC | PRN
  Administered 2019-06-02: 650 mg via ORAL
  Filled 2019-06-01: qty 2

## 2019-06-01 MED ORDER — SODIUM CHLORIDE 0.9 % IV BOLUS
1000.0000 mL | Freq: Once | INTRAVENOUS | Status: AC
  Administered 2019-06-01: 1000 mL via INTRAVENOUS

## 2019-06-01 MED ORDER — DIPHENHYDRAMINE HCL 50 MG/ML IJ SOLN
50.0000 mg | Freq: Once | INTRAMUSCULAR | Status: AC
  Administered 2019-06-01: 50 mg via INTRAVENOUS
  Filled 2019-06-01: qty 1

## 2019-06-01 MED ORDER — ACETAMINOPHEN 325 MG PO TABS
ORAL_TABLET | ORAL | Status: AC
  Filled 2019-06-01: qty 2
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
## 2019-06-01 NOTE — Progress Notes (Signed)
Pharmacy Lovenox Dosing  24 y.o. female admitted with Abdominal Pain . Patient ordered Lovenox 40 mg daily for VTE prophylaxis.   Filed Weights   06/01/19 1330  Weight: 90 lb (40.8 kg)    Body mass index is 15.45 kg/m.  Estimated Creatinine Clearance: 70.4 mL/min (by C-G formula based on SCr of 0.68 mg/dL).   Weight is less than 45kg, therefore:  Will adjust Lovenox dosing to 30 mg Q24 hours.   [name] 06/01/2019 8:09 PM

## 2019-06-01 NOTE — ED Triage Notes (Signed)
Pain right lower abd and into right lower back for 2-3 days.had uti  1 month ago or so.  Says she does have some dysuria.  No fever.

## 2019-06-01 NOTE — ED Notes (Signed)
Pt was assisted onto the bedpan.
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
## 2019-06-01 NOTE — ED Notes (Signed)
Pt states she started feeling worse about 10-15 minutes ago, pt has rash on left side of face/neck. PA at bedside.

## 2019-06-02 DIAGNOSIS — N10 Acute tubulo-interstitial nephritis: Principal | ICD-10-CM

## 2019-06-02 DIAGNOSIS — E876 Hypokalemia: Secondary | ICD-10-CM

## 2019-06-02 DIAGNOSIS — N136 Pyonephrosis: Secondary | ICD-10-CM

## 2019-06-02 LAB — BASIC METABOLIC PANEL
Anion gap: 4 — ABNORMAL LOW (ref 5–15)
BUN: 7 mg/dL (ref 6–20)
CO2: 21 mmol/L — ABNORMAL LOW (ref 22–32)
Calcium: 7.1 mg/dL — ABNORMAL LOW (ref 8.9–10.3)
Chloride: 114 mmol/L — ABNORMAL HIGH (ref 98–111)
Creatinine, Ser: 0.53 mg/dL (ref 0.44–1.00)
GFR calc Af Amer: >60 mL/min (ref >60)
GFR calc non Af Amer: >60 mL/min (ref >60)
Glucose, Bld: 97 mg/dL (ref 70–99)
Potassium: 2.9 mmol/L — ABNORMAL LOW (ref 3.5–5.1)
Sodium: 139 mmol/L (ref 135–145)

## 2019-06-02 LAB — CBC
HCT: 27.5 % — ABNORMAL LOW (ref 36.0–46.0)
Hemoglobin: 9 g/dL — ABNORMAL LOW (ref 12.0–15.0)
MCH: 31.7 pg (ref 26.0–34.0)
MCHC: 32.7 g/dL (ref 30.0–36.0)
MCV: 96.8 fL (ref 80.0–100.0)
Platelets: 164 10*3/uL (ref 150–400)
RBC: 2.84 MIL/uL — ABNORMAL LOW (ref 3.87–5.11)
RDW: 13.3 % (ref 11.5–15.5)
WBC: 11.6 10*3/uL — ABNORMAL HIGH (ref 4.0–10.5)
nRBC: 0 % (ref 0.0–0.2)

## 2019-06-02 LAB — SARS CORONAVIRUS 2 (TAT 6-24 HRS): SARS Coronavirus 2: NEGATIVE

## 2019-06-02 LAB — MAGNESIUM: Magnesium: 1.5 mg/dL — ABNORMAL LOW (ref 1.7–2.4)

## 2019-06-02 LAB — URINE CULTURE: Culture: <10000 — AB

## 2019-06-02 LAB — POTASSIUM: Potassium: 3.4 mmol/L — ABNORMAL LOW (ref 3.5–5.1)

## 2019-06-02 LAB — HIV ANTIBODY (ROUTINE TESTING W REFLEX): HIV Screen 4th Generation wRfx: NONREACTIVE

## 2019-06-02 MED ORDER — OXYCODONE-ACETAMINOPHEN 5-325 MG PO TABS
1.0000 | ORAL_TABLET | Freq: Four times a day (QID) | ORAL | Status: DC | PRN
  Administered 2019-06-02 – 2019-06-03 (×3): 1 via ORAL
  Filled 2019-06-02 (×3): qty 1

## 2019-06-02 MED ORDER — MORPHINE SULFATE (PF) 2 MG/ML IV SOLN
2.0000 mg | INTRAVENOUS | Status: DC | PRN
  Administered 2019-06-02: 2 mg via INTRAVENOUS
  Filled 2019-06-02: qty 1

## 2019-06-02 MED ORDER — POTASSIUM CHLORIDE CRYS ER 20 MEQ PO TBCR
40.0000 meq | EXTENDED_RELEASE_TABLET | Freq: Four times a day (QID) | ORAL | Status: AC
  Administered 2019-06-02 (×2): 40 meq via ORAL
  Filled 2019-06-02 (×2): qty 2

## 2019-06-02 NOTE — Plan of Care (Signed)

## 2019-06-02 NOTE — Progress Notes (Signed)
The nurse messaged Dr. Sherryll Burger regarding additional medication order for pt's persistent headache.  Waiting for dr response.

## 2019-06-02 NOTE — Progress Notes (Signed)
Report given to 1A RN. Patient to be transported to room.  

## 2019-06-02 NOTE — Progress Notes (Signed)
[facility] NAME: [name]    MR#:  [id number]  DATE OF BIRTH:  May 20, 1995  SUBJECTIVE:  CHIEF COMPLAINT:   Chief Complaint  Patient presents with  . Abdominal Pain  reports pain upon asking but seems very comfortable  REVIEW OF SYSTEMS:  Review of Systems  Constitutional: Negative for diaphoresis, fever, malaise/fatigue and weight loss.  HENT: Negative for ear discharge, ear pain, hearing loss, nosebleeds, sore throat and tinnitus.   Eyes: Negative for blurred vision and pain.  Respiratory: Negative for cough, hemoptysis, shortness of breath and wheezing.   Cardiovascular: Negative for chest pain, palpitations, orthopnea and leg swelling.  Gastrointestinal: Negative for abdominal pain, blood in stool, constipation, diarrhea, heartburn, nausea and vomiting.  Genitourinary: Positive for flank pain (rt). Negative for dysuria, frequency and urgency.  Musculoskeletal: Negative for back pain and myalgias.  Skin: Negative for itching and rash.  Neurological: Negative for dizziness, tingling, tremors, focal weakness, seizures, weakness and headaches.  Psychiatric/Behavioral: Negative for depression. The patient is not nervous/anxious.     DRUG ALLERGIES:  No Known Allergies VITALS:  Blood pressure 120/78, pulse (!) 109, temperature 98.8 F (37.1 C), resp. rate 18, height 5\' 4"  (1.626 m), weight 40.8 kg, last menstrual period 05/30/2019, SpO2 100 %, not currently breastfeeding. PHYSICAL EXAMINATION:  Physical Exam HENT:     Head: Normocephalic and atraumatic.  Eyes:     Conjunctiva/sclera: Conjunctivae normal.     Pupils: Pupils are equal, round, and reactive to light.  Neck:     Thyroid: No thyromegaly.     Trachea: No tracheal deviation.  Cardiovascular:     Rate and Rhythm: Normal rate and regular rhythm.     Heart sounds: Normal heart sounds.  Pulmonary:     Effort: Pulmonary effort is normal. No respiratory distress.     Breath  sounds: Normal breath sounds. No wheezing.  Chest:     Chest wall: No tenderness.  Abdominal:     General: Bowel sounds are normal. There is no distension.     Palpations: Abdomen is soft.     Tenderness: There is no abdominal tenderness. There is right CVA tenderness.  Musculoskeletal:        General: Normal range of motion.     Cervical back: Normal range of motion and neck supple.  Skin:    General: Skin is warm and dry.     Findings: No rash.  Neurological:     Mental Status: She is alert and oriented to person, place, and time.     Cranial Nerves: No cranial nerve deficit.    LABORATORY PANEL:  Female CBC Recent Labs  Lab 06/02/19 0417  WBC 11.6*  HGB 9.0*  HCT 27.5*  PLT 164   ------------------------------------------------------------------------------------------------------------------ Chemistries  Recent Labs  Lab 06/01/19 1333 06/01/19 2004 06/02/19 0417 06/02/19 0417 06/02/19 1111  NA 135  --  139  --   --   K 3.8  --  2.9*   < > 3.4*  CL 103  --  114*  --   --   CO2 22  --  21*  --   --   GLUCOSE 181*  --  97  --   --   BUN 9  --  7  --   --   CREATININE 0.68   < > 0.53  --   --   CALCIUM 9.0  --  7.1*  --   --   MG  --   --  1.5*  --   --   AST 18  --   --   --   --   ALT 10  --   --   --   --   ALKPHOS 63  --   --   --   --   BILITOT 0.6  --   --   --   --    < > = values in this interval not displayed.   RADIOLOGY:  No results found. ASSESSMENT AND PLAN:   Acute Right pyelonephritis with pyelonephrosis - seen on CT -continue IV Zosyn every 6 in view of purulence related with pyelonephrosis -continue IV fluids, -IV antiemetics, IV Toradol, with narcotics (percocet) for breakthrough -Follow cultures -outpt urology f/up  Hypokalemia - replete and recheck - 2.9->3.4  Her behavior is concerning for drug seeking. Will check UDS   DVT prophylaxis: Lovenox Family Communication:  NO "discussed with patient" Disposition Plan:  Came from  Home D/C back Home Barriers to DC - waiting for clinical improvement  All the records are reviewed and case discussed with Care Management/Social Worker. Management plans discussed with the patient, nursing and they are in agreement.  CODE STATUS: Full Code  TOTAL TIME TAKING CARE OF THIS PATIENT: 35 minutes.   More than 50% of the time was spent in counseling/coordination of care: YES  POSSIBLE D/C IN 1-2 DAYS, DEPENDING ON CLINICAL CONDITION.   Delfino Lovett M.D on 06/02/2019 at 6:37 PM  [facility]   CC: Primary care physician; [facility], [place]  Note: This dictation was prepared with Dragon dictation along with smaller phrase technology. Any transcriptional errors that result from this process are unintentional.

## 2019-06-03 DIAGNOSIS — N12 Tubulo-interstitial nephritis, not specified as acute or chronic: Secondary | ICD-10-CM

## 2019-06-03 LAB — CBC
HCT: 29.6 % — ABNORMAL LOW (ref 36.0–46.0)
Hemoglobin: 9.7 g/dL — ABNORMAL LOW (ref 12.0–15.0)
MCH: 32.2 pg (ref 26.0–34.0)
MCHC: 32.8 g/dL (ref 30.0–36.0)
MCV: 98.3 fL (ref 80.0–100.0)
Platelets: 164 10*3/uL (ref 150–400)
RBC: 3.01 MIL/uL — ABNORMAL LOW (ref 3.87–5.11)
RDW: 13.6 % (ref 11.5–15.5)
WBC: 8.5 10*3/uL (ref 4.0–10.5)
nRBC: 0 % (ref 0.0–0.2)

## 2019-06-03 LAB — BASIC METABOLIC PANEL
Anion gap: 7 (ref 5–15)
BUN: 7 mg/dL (ref 6–20)
CO2: 22 mmol/L (ref 22–32)
Calcium: 8.1 mg/dL — ABNORMAL LOW (ref 8.9–10.3)
Chloride: 110 mmol/L (ref 98–111)
Creatinine, Ser: 0.67 mg/dL (ref 0.44–1.00)
GFR calc Af Amer: >60 mL/min (ref >60)
GFR calc non Af Amer: >60 mL/min (ref >60)
Glucose, Bld: 104 mg/dL — ABNORMAL HIGH (ref 70–99)
Potassium: 3.7 mmol/L (ref 3.5–5.1)
Sodium: 139 mmol/L (ref 135–145)

## 2019-06-03 MED ORDER — CEPHALEXIN 250 MG PO CAPS: 250.0000 mg | ORAL_CAPSULE | Freq: Two times a day (BID) | ORAL | 0 refills | Status: AC

## 2019-06-03 NOTE — Plan of Care (Signed)
  Problem: Education: Goal: Knowledge of General Education information will improve Description: Including pain rating scale, medication(s)/side effects and non-pharmacologic comfort measures Outcome: Progressing   Problem: Clinical Measurements: Goal: Ability to maintain clinical measurements within normal limits will improve Outcome: Progressing Goal: Will remain free from infection Outcome: Progressing Goal: Diagnostic test results will improve Outcome: Progressing   Problem: Activity: Goal: Risk for activity intolerance will decrease Outcome: Progressing   Problem: Coping: Goal: Level of anxiety will decrease Outcome: Progressing   Problem: Elimination: Goal: Will not experience complications related to bowel motility Outcome: Progressing   Problem: Pain Managment: Goal: General experience of comfort will improve Outcome: Progressing

## 2019-06-03 NOTE — Plan of Care (Signed)

## 2019-06-03 NOTE — Progress Notes (Signed)
Patient discharging home, instructions given. Patient to drive home since she drove here.

## 2019-06-03 NOTE — Discharge Summary (Signed)
[place] [facility] [place] NAME: [name]    MR#:  [id number]  DATE OF BIRTH:  [birth year]  DATE OF ADMISSION:  06/01/2019   ADMITTING PHYSICIAN: Athena Masse, MD  DATE OF DISCHARGE: 06/03/2019 12:51 PM  PRIMARY CARE PHYSICIAN: [facility]   ADMISSION DIAGNOSIS:  Pyelonephritis [N12] Acute pyelonephritis [N10] DISCHARGE DIAGNOSIS:  Active Problems:   Pyelonephritis   Acute pyonephrosis  SECONDARY DIAGNOSIS:   Past Medical History:  Diagnosis Date  . Abnormal uterine bleeding   . Cannabis use disorder, moderate, dependence ([place]) 09/17/2016  . Deliberate self-cutting 09/17/2016  . MDD (major depressive disorder), single episode, moderate ([place]) 09/18/2016  . Medical history non-contributory   . Tobacco use disorder 09/17/2016   HOSPITAL COURSE:  [name] is a 24 y.o. female with medical history significant for pyelonephritis in December 2020 admitted with a 2 to 3-day history of right sided mid to low back pain radiating to the right lower quadrant.  Acute Right pyelonephritis with pyelonephrosis- seen on CT -Treated with IV Zosyn while in the hospital.  She remained afebrile.  Tolerating diet.  No leukocytosis.  No flank pain -outpt urology f/up recommended  Hypokalemia -Repleted and resolved  DISCHARGE CONDITIONS:  Stable CONSULTS OBTAINED:   DRUG ALLERGIES:  No Known Allergies DISCHARGE MEDICATIONS:   Allergies as of 06/03/2019   No Known Allergies     Medication List    TAKE these medications   cephALEXin 250 MG capsule Commonly known as: KEFLEX Take 1 capsule (250 mg total) by mouth 2 (two) times daily for 10 days.   multivitamin with minerals tablet Take 1 tablet by mouth daily.   Prenatal Vitamins 0.8 MG tablet Take 1 tablet by mouth daily.      DISCHARGE INSTRUCTIONS:   DIET:  Regular diet DISCHARGE CONDITION:  Stable ACTIVITY:  Activity as tolerated OXYGEN:  Home Oxygen: No.  Oxygen  Delivery: room air DISCHARGE LOCATION:  home   If you experience worsening of your admission symptoms, develop shortness of breath, life threatening emergency, suicidal or homicidal thoughts you must seek medical attention immediately by calling 911 or calling your MD immediately  if symptoms less severe.  You Must read complete instructions/literature along with all the possible adverse reactions/side effects for all the Medicines you take and that have been prescribed to you. Take any new Medicines after you have completely understood and accpet all the possible adverse reactions/side effects.   Please note  You were cared for by a hospitalist during your hospital stay. If you have any questions about your discharge medications or the care you received while you were in the hospital after you are discharged, you can call the unit and asked to speak with the hospitalist on call if the hospitalist that took care of you is not available. Once you are discharged, your primary care physician will handle any further medical issues. Please note that NO REFILLS for any discharge medications will be authorized once you are discharged, as it is imperative that you return to your primary care physician (or establish a relationship with a primary care physician if you do not have one) for your aftercare needs so that they can reassess your need for medications and monitor your lab values.    On the day of Discharge:  VITAL SIGNS:  Blood pressure 113/64, pulse 89, temperature 98.1 F (36.7 C), temperature source Oral, resp. rate 18, height 5'  4" (1.626 m), weight 40.8 kg, last menstrual period 05/30/2019, SpO2 99 %, not currently breastfeeding. PHYSICAL EXAMINATION:  GENERAL:  24 y.o.-year-old patient lying in the bed with no acute distress.  EYES: Pupils equal, round, reactive to light and accommodation. No scleral icterus. Extraocular muscles intact.  HEENT: Head atraumatic, normocephalic. Oropharynx and  nasopharynx clear.  NECK:  Supple, no jugular venous distention. No thyroid enlargement, no tenderness.  LUNGS: Normal breath sounds bilaterally, no wheezing, rales,rhonchi or crepitation. No use of accessory muscles of respiration.  CARDIOVASCULAR: S1, S2 normal. No murmurs, rubs, or gallops.  ABDOMEN: Soft, non-tender, non-distended. Bowel sounds present. No organomegaly or mass.  EXTREMITIES: No pedal edema, cyanosis, or clubbing.  NEUROLOGIC: Cranial nerves II through XII are intact. Muscle strength 5/5 in all extremities. Sensation intact. Gait not checked.  PSYCHIATRIC: The patient is alert and oriented x 3.  SKIN: No obvious rash, lesion, or ulcer.  DATA REVIEW:   CBC Recent Labs  Lab 06/03/19 0432  WBC 8.5  HGB 9.7*  HCT 29.6*  PLT 164    Chemistries  Recent Labs  Lab 06/01/19 1333 06/01/19 2004 06/02/19 0417 06/02/19 1111 06/03/19 0432  NA 135  --  139  --  139  K 3.8  --  2.9*   < > 3.7  CL 103  --  114*  --  110  CO2 22  --  21*  --  22  GLUCOSE 181*  --  97  --  104*  BUN 9  --  7  --  7  CREATININE 0.68   < > 0.53  --  0.67  CALCIUM 9.0  --  7.1*  --  8.1*  MG  --   --  1.5*  --   --   AST 18  --   --   --   --   ALT 10  --   --   --   --   ALKPHOS 63  --   --   --   --   BILITOT 0.6  --   --   --   --    < > = values in this interval not displayed.     Outpatient follow-up Follow-up Information    [facility] EMERGENCY DEPARTMENT.   Specialty: Emergency Medicine Why: As needed Contact information: [address] [id number] [place] [address] [phone]       [facility], [place]. Schedule an appointment as soon as possible for a visit in 1 week(s).   Contact information: [address] [place] [address] [phone]        Vanna Scotland, MD. Schedule an appointment as soon as possible for a visit in 1 week(s).   Specialty: Urology Contact information: [address] [place] [address] [phone]            Management plans discussed with the patient, family and they are in agreement.  CODE STATUS: Full Code   TOTAL TIME TAKING CARE OF THIS PATIENT: 45 minutes.    Delfino Lovett M.D on 06/03/2019 at 3:50 PM  [facility]   CC: Primary care physician; [facility], [place]   Note: This dictation was prepared with Dragon dictation along with smaller phrase technology. Any transcriptional errors that result from this process are unintentional.

## 2019-06-03 NOTE — Discharge Instructions (Signed)
Pyelonephritis, Adult  Pyelonephritis is an infection that occurs in the kidney. The kidneys are organs that help clean the blood by moving waste out of the blood and into the pee (urine). This infection can happen quickly, or it can last for a long time. In most cases, it clears up with treatment and does not cause other problems. What are the causes? This condition may be caused by:  Germs (bacteria) going from the bladder up to the kidney. This may happen after having a bladder infection.  Germs going from the blood to the kidney. What increases the risk? This condition is more likely to develop in:  Pregnant women.  Older people.  People who have any of these conditions: ? Diabetes. ? Inflammation of the prostate gland (prostatitis), in males. ? Kidney stones or bladder stones. ? Other problems with the kidney or the parts of your body that carry pee from the kidneys to the bladder (ureters). ? Cancer.  People who have a small, thin tube (catheter) placed in the bladder.  People who are sexually active.  Women who use a medicine that kills sperm (spermicide) to prevent pregnancy.  People who have had a prior urinary tract infection (UTI). What are the signs or symptoms? Symptoms of this condition include:  Peeing often.  A strong urge to pee right away.  Burning or stinging when peeing.  Belly pain.  Back pain.  Pain in the side (flank area).  Fever or chills.  Blood in the pee, or dark pee.  Feeling sick to your stomach (nauseous) or throwing up (vomiting). How is this treated? This condition may be treated by:  Taking antibiotic medicines by mouth (orally).  Drinking enough fluids. If the infection is bad, you may need to stay in the hospital. You may be given antibiotics and fluids that are put directly into a vein through an IV tube. In some cases, other treatments may be needed. Follow these instructions at home: Medicines  Take your  antibiotic medicine as told by your doctor. Do not stop taking the antibiotic even if you start to feel better.  Take over-the-counter and prescription medicines only as told by your doctor. General instructions   Drink enough fluid to keep your pee pale yellow.  Avoid caffeine, tea, and carbonated drinks.  Pee (urinate) often. Avoid holding in pee for long periods of time.  Pee before and after sex.  After pooping (having a bowel movement), women should wipe from front to back. Use each tissue only once.  Keep all follow-up visits as told by your doctor. This is important. Contact a doctor if:  You do not feel better after 2 days.  Your symptoms get worse.  You have a fever. Get help right away if:  You cannot take your medicine or drink fluids as told.  You have chills and shaking.  You throw up.  You have very bad pain in your side or back.  You feel very weak or you pass out (faint). Summary  Pyelonephritis is an infection that occurs in the kidney.  In most cases, this infection clears up with treatment and does not cause other problems.  Take your antibiotic medicine as told by your doctor. Do not stop taking the antibiotic even if you start to feel better.  Drink enough fluid to keep your pee pale yellow. This information is not intended to replace advice given to you by your health care provider. Make sure you discuss any questions you have  with your health care provider. Document Revised: 02/01/2018 Document Reviewed: 02/01/2018 Elsevier Patient Education  2020 [employer]. Pyelonephritis, Adult  Pyelonephritis is an infection that occurs in the kidney. The kidneys are organs that help clean the blood by moving waste out of the blood and into the pee (urine). This infection can happen quickly, or it can last for a long time. In most cases, it clears up with treatment and does not cause other problems. What are the causes? This condition may be caused  by:  Germs (bacteria) going from the bladder up to the kidney. This may happen after having a bladder infection.  Germs going from the blood to the kidney. What increases the risk? This condition is more likely to develop in:  Pregnant women.  Older people.  People who have any of these conditions: ? Diabetes. ? Inflammation of the prostate gland (prostatitis), in males. ? Kidney stones or bladder stones. ? Other problems with the kidney or the parts of your body that carry pee from the kidneys to the bladder (ureters). ? Cancer.  People who have a small, thin tube (catheter) placed in the bladder.  People who are sexually active.  Women who use a medicine that kills sperm (spermicide) to prevent pregnancy.  People who have had a prior urinary tract infection (UTI). What are the signs or symptoms? Symptoms of this condition include:  Peeing often.  A strong urge to pee right away.  Burning or stinging when peeing.  Belly pain.  Back pain.  Pain in the side (flank area).  Fever or chills.  Blood in the pee, or dark pee.  Feeling sick to your stomach (nauseous) or throwing up (vomiting). How is this treated? This condition may be treated by:  Taking antibiotic medicines by mouth (orally).  Drinking enough fluids. If the infection is bad, you may need to stay in the hospital. You may be given antibiotics and fluids that are put directly into a vein through an IV tube. In some cases, other treatments may be needed. Follow these instructions at home: Medicines  Take your antibiotic medicine as told by your doctor. Do not stop taking the antibiotic even if you start to feel better.  Take over-the-counter and prescription medicines only as told by your doctor. General instructions   Drink enough fluid to keep your pee pale yellow.  Avoid caffeine, tea, and carbonated drinks.  Pee (urinate) often. Avoid holding in pee for long periods of time.  Pee before  and after sex.  After pooping (having a bowel movement), women should wipe from front to back. Use each tissue only once.  Keep all follow-up visits as told by your doctor. This is important. Contact a doctor if:  You do not feel better after 2 days.  Your symptoms get worse.  You have a fever. Get help right away if:  You cannot take your medicine or drink fluids as told.  You have chills and shaking.  You throw up.  You have very bad pain in your side or back.  You feel very weak or you pass out (faint). Summary  Pyelonephritis is an infection that occurs in the kidney.  In most cases, this infection clears up with treatment and does not cause other problems.  Take your antibiotic medicine as told by your doctor. Do not stop taking the antibiotic even if you start to feel better.  Drink enough fluid to keep your pee pale yellow. This information is not intended to  replace advice given to you by your health care provider. Make sure you discuss any questions you have with your health care provider. °Document Revised: 02/01/2018 Document Reviewed: 02/01/2018 °Elsevier Patient Education © 2020 Elsevier Inc[employer] ° °

## 2019-06-06 LAB — CULTURE, BLOOD (ROUTINE X 2)
Culture: NO GROWTH
Culture: NO GROWTH
Special Requests: ADEQUATE
Special Requests: ADEQUATE

## 2019-06-13 IMAGING — US US OB TRANSVAGINAL
1 series · 14 of 28 positions shown · non-contrast
Comparison: None.

CLINICAL DATA: First trimester of pregnancy, vaginal bleeding.

EXAM:
OBSTETRIC <14 WK US AND TRANSVAGINAL OB US
TECHNIQUE: Both transabdominal and transvaginal ultrasound examinations were
performed for complete evaluation of the gestation as well as the
maternal uterus, adnexal regions, and pelvic cul-de-sac.
Transvaginal technique was performed to assess early pregnancy.

[Series 1: us ob transvaginal · 0.15mm/px · 14 of 146 slices shown]
[im 6/146]
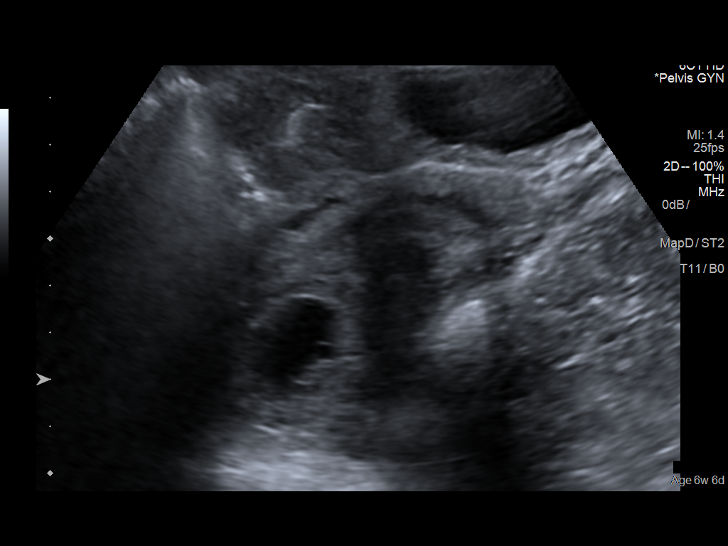
[im 17/146]
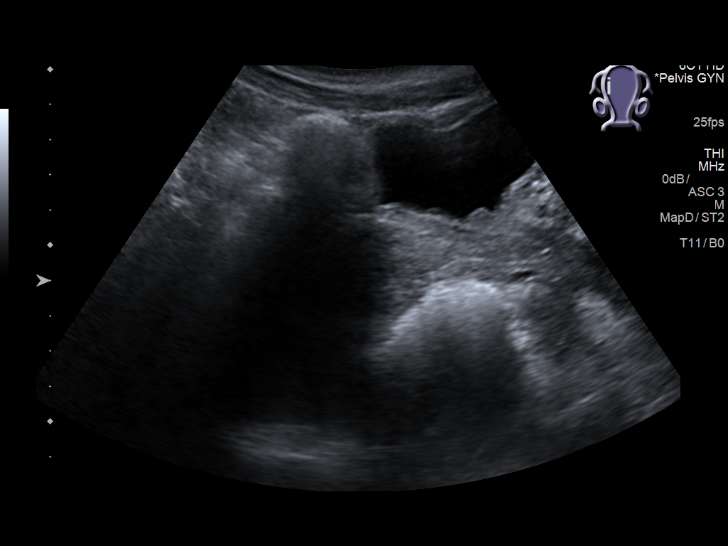
[im 27/146]
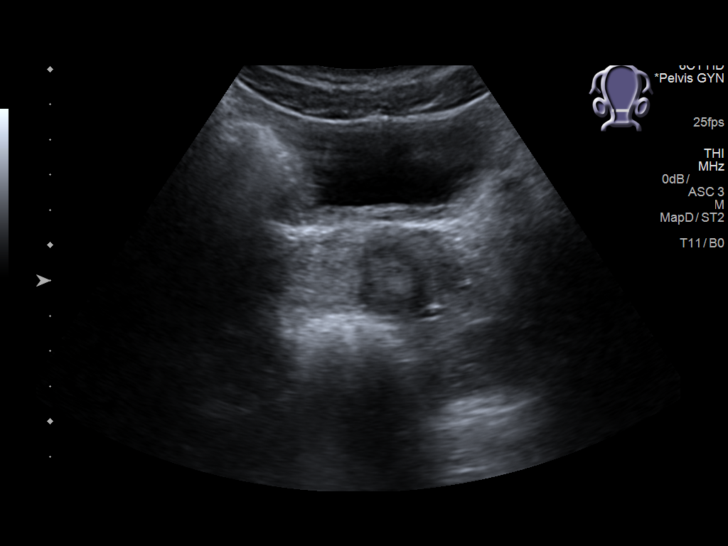
[im 38/146]
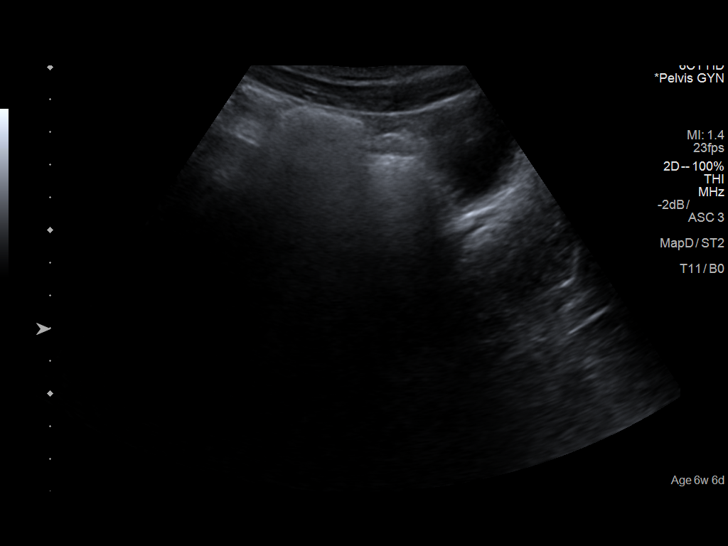
[im 49/146]
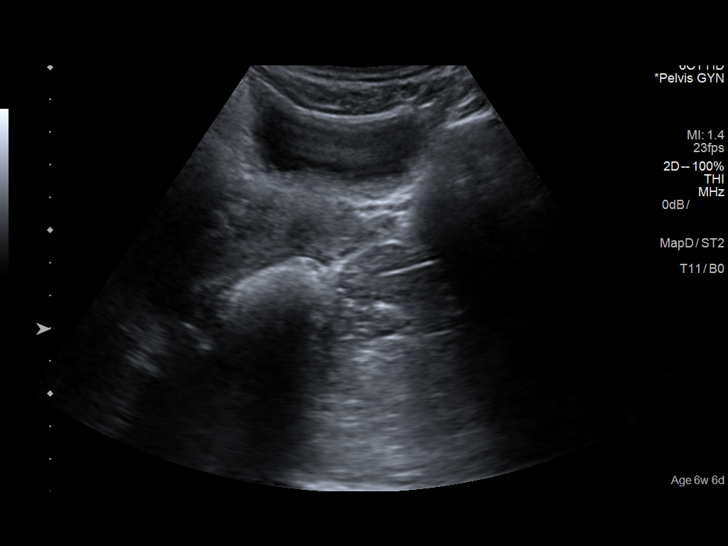
[im 60/146]
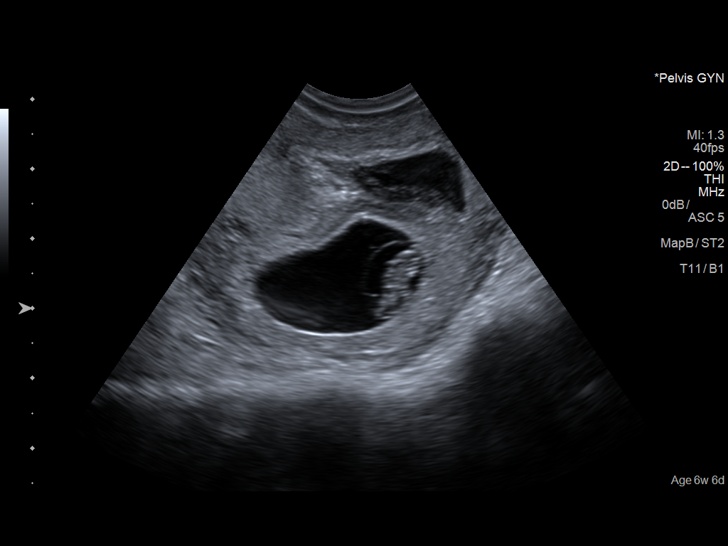
[im 70/146]
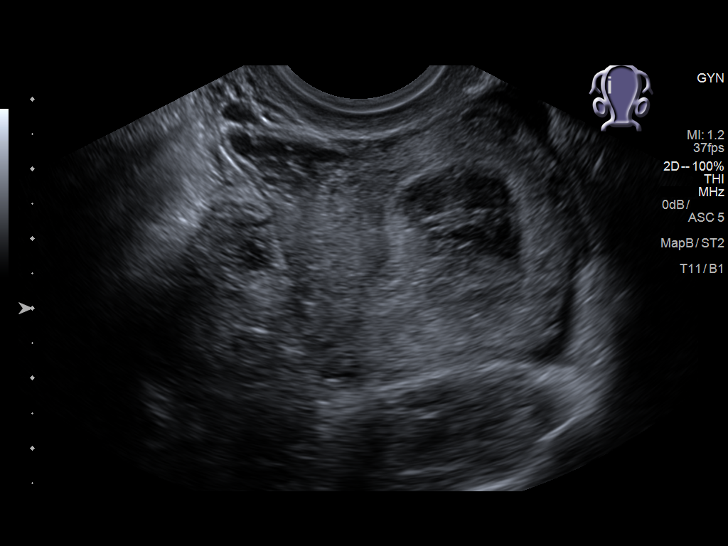
[im 81/146]
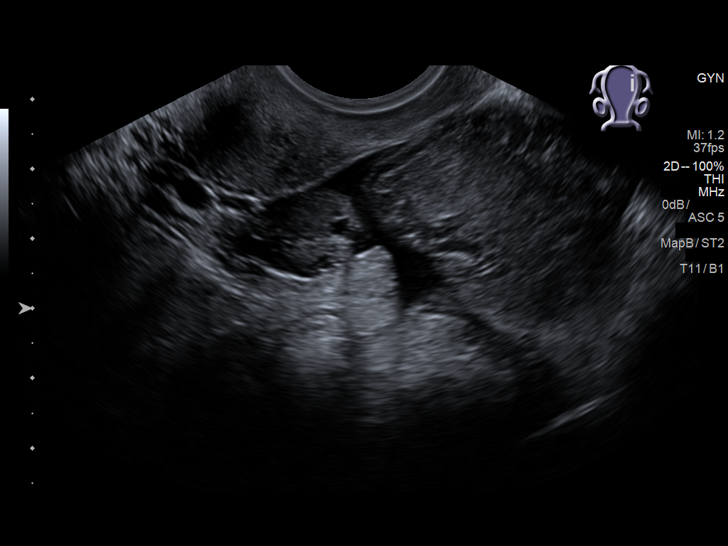
[im 92/146]
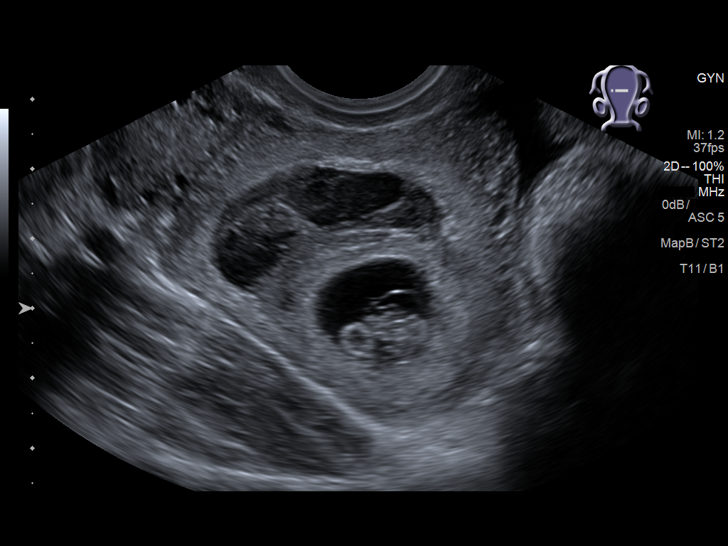
[im 103/146]
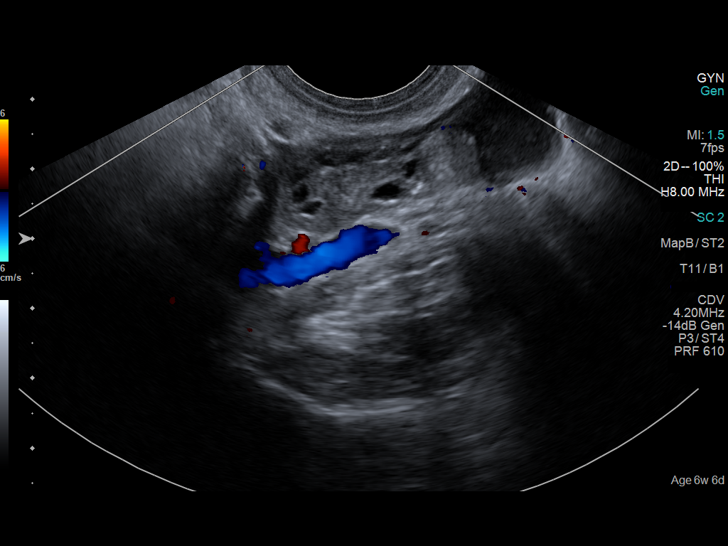
[im 113/146]
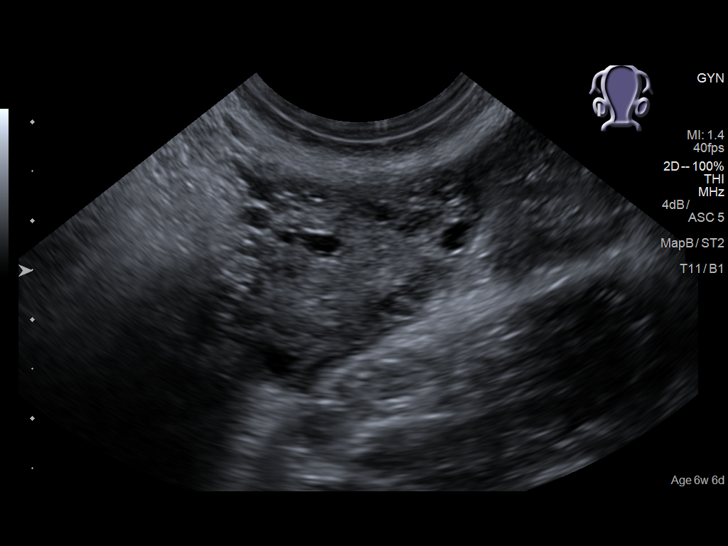
[im 124/146]
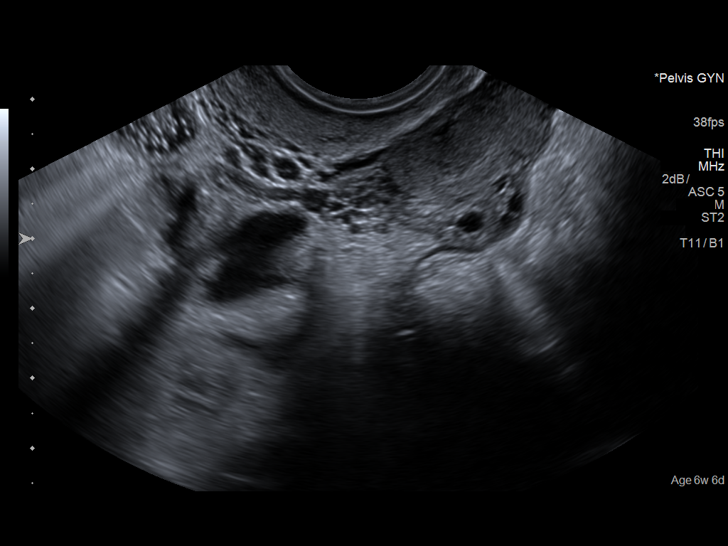
[im 135/146]
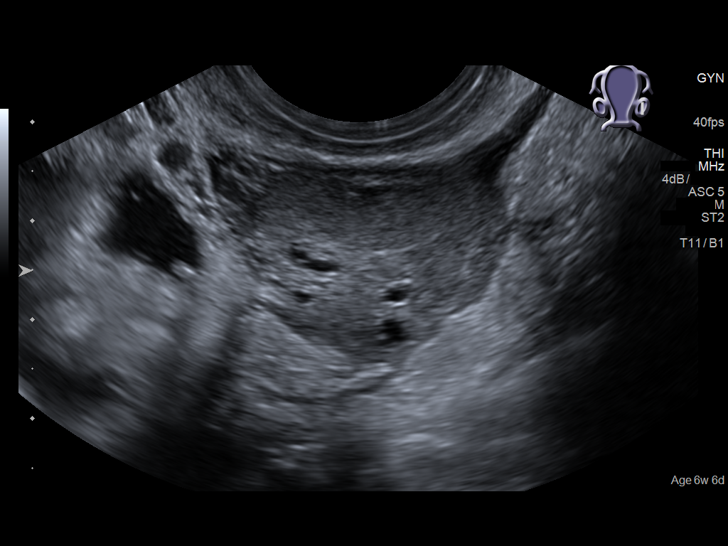
[im 146/146]
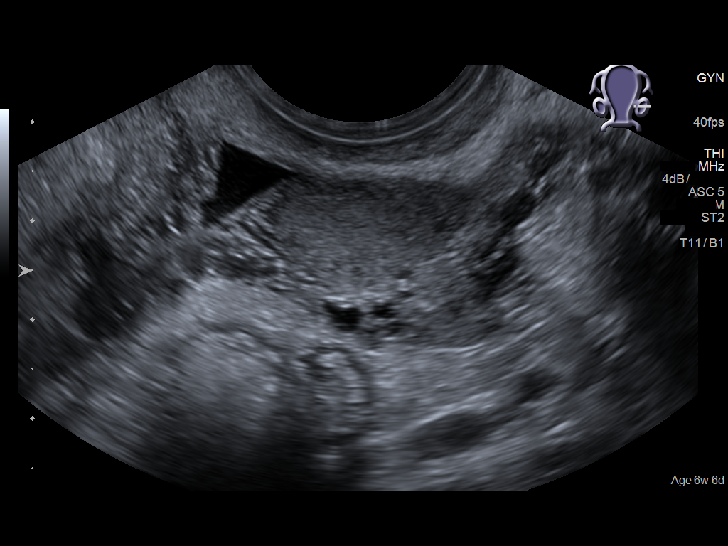

[14 of 28 positions shown; findings below may reference images not displayed]

FINDINGS: Intrauterine gestational sac: Single visualized.

Yolk sac:  Visualized.

Embryo:  Visualized.

Cardiac Activity: Visualized.

Heart Rate: 144  bpm

CRL:  10.6  mm   7 w   1 d                  US EDC: September 10, 2017.

Subchorionic hemorrhage: Moderate size subchorionic hemorrhage
measuring 3.3 x 2.3 x 1.2 cm is noted.

Maternal uterus/adnexae: Both ovaries appear normal. Small amount of
free fluid is noted which most likely is physiologic.
IMPRESSION: Single live intrauterine gestation of 7 weeks 1 day. Moderate size
subchorionic hemorrhage is noted as described above.

## 2019-07-04 IMAGING — US US OB COMP LESS 14 WK
1 series · 14 of 28 positions shown · non-contrast
Comparison: January 31, 2017

CLINICAL DATA: Pregnant patient with vaginal bleeding.

EXAM:
OBSTETRIC <14 WK ULTRASOUND
TECHNIQUE: Transabdominal ultrasound was performed for evaluation of the
gestation as well as the maternal uterus and adnexal regions.

[Series 1: us ob comp less 14 wk · 0.20mm/px · 14 of 54 slices shown]
[im 2/54]
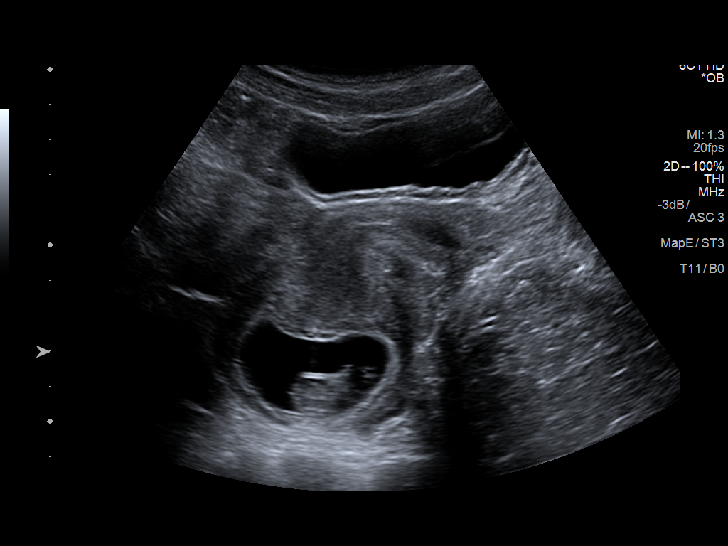
[im 6/54]
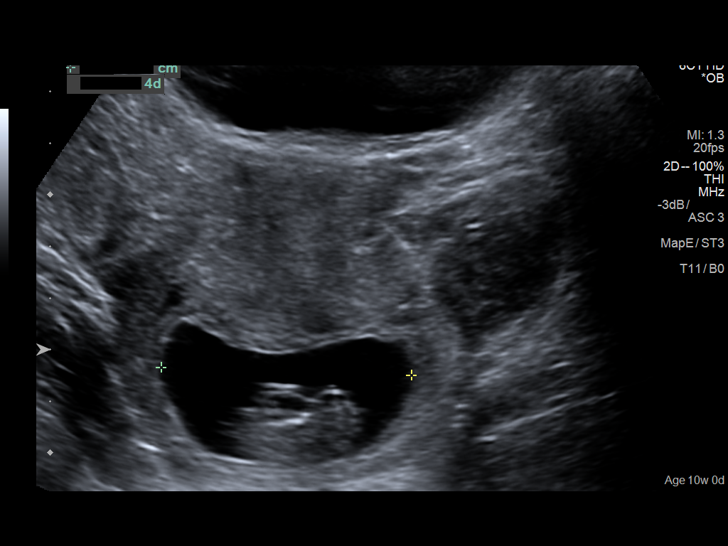
[im 10/54]
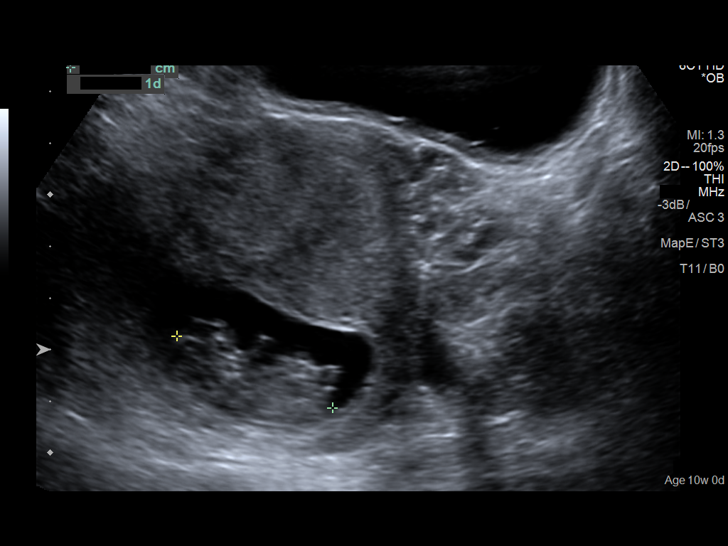
[im 14/54]
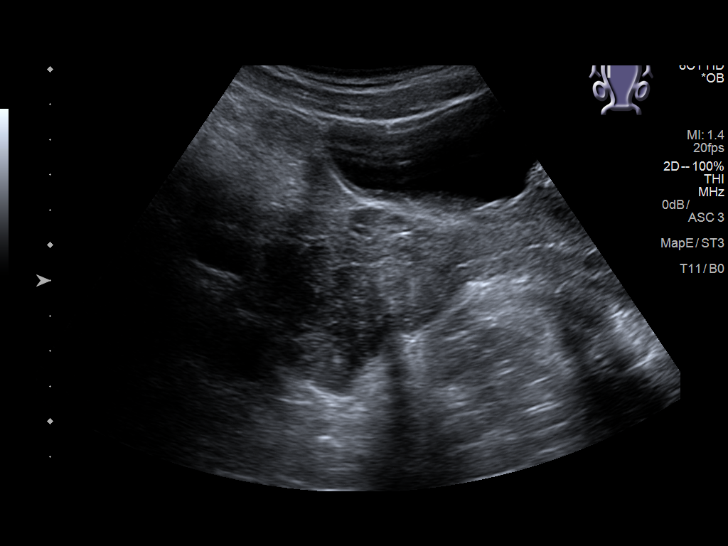
[im 18/54]
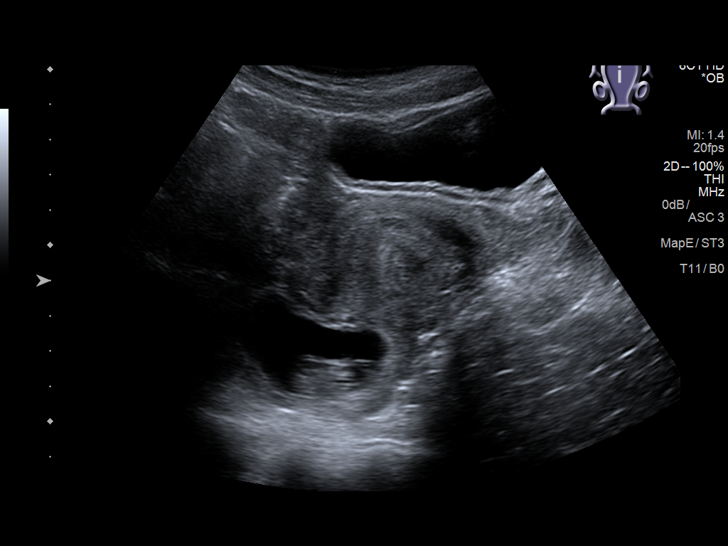
[im 22/54]
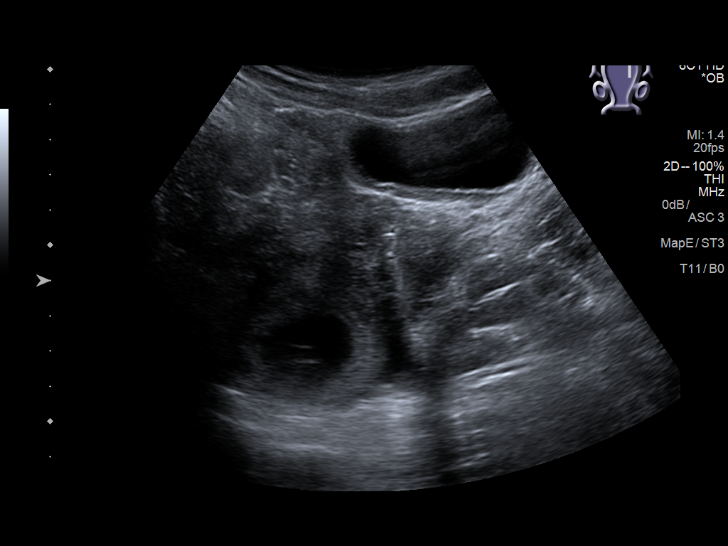
[im 26/54]
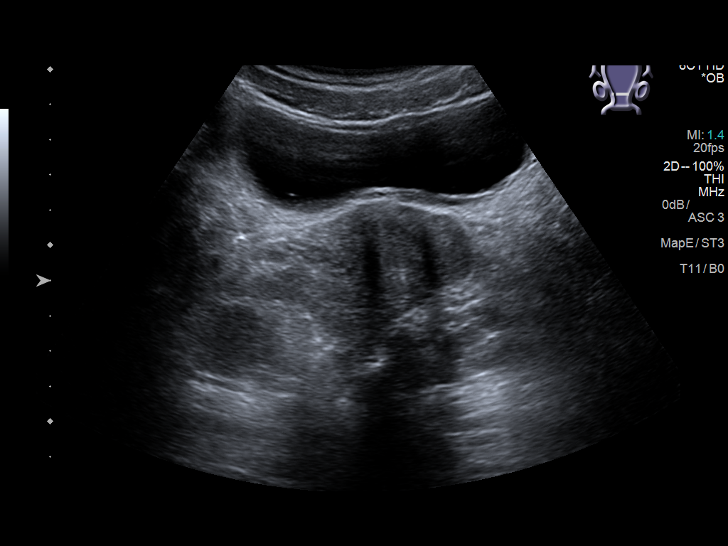
[im 30/54]
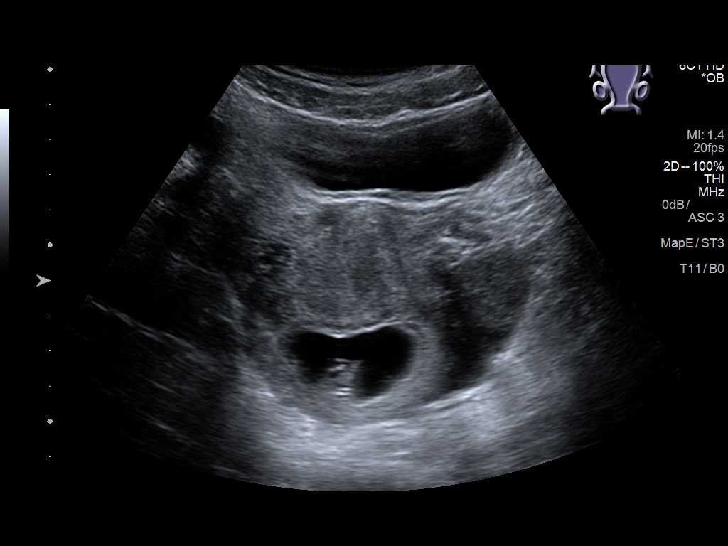
[im 34/54]
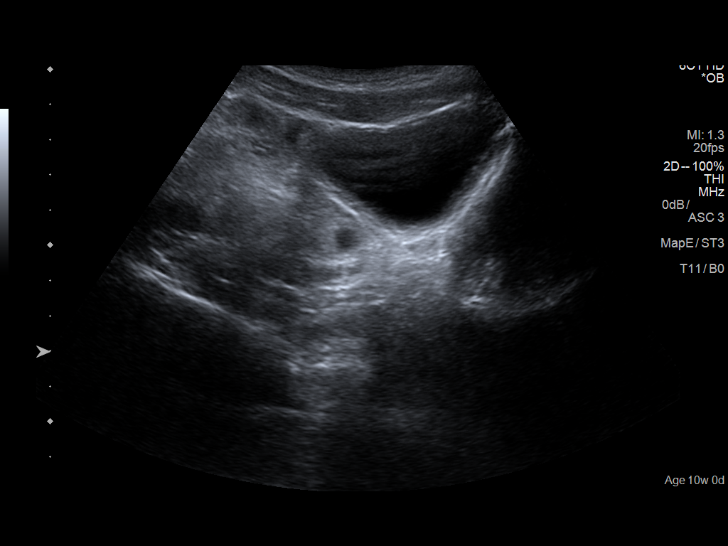
[im 38/54]
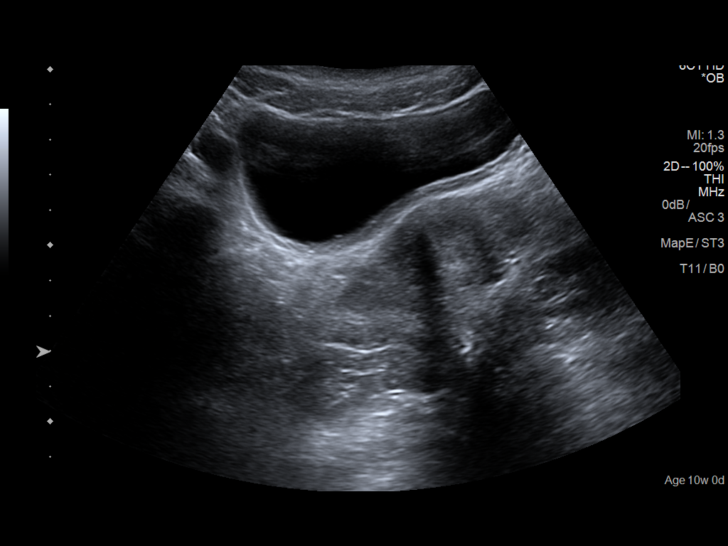
[im 42/54]
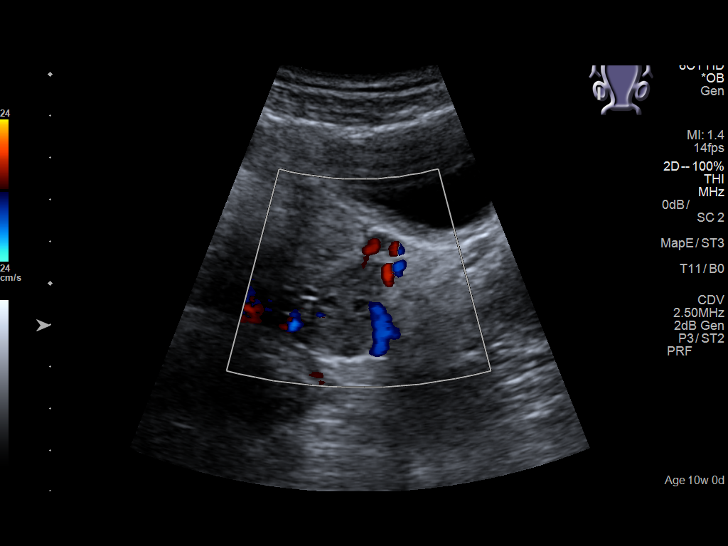
[im 46/54]
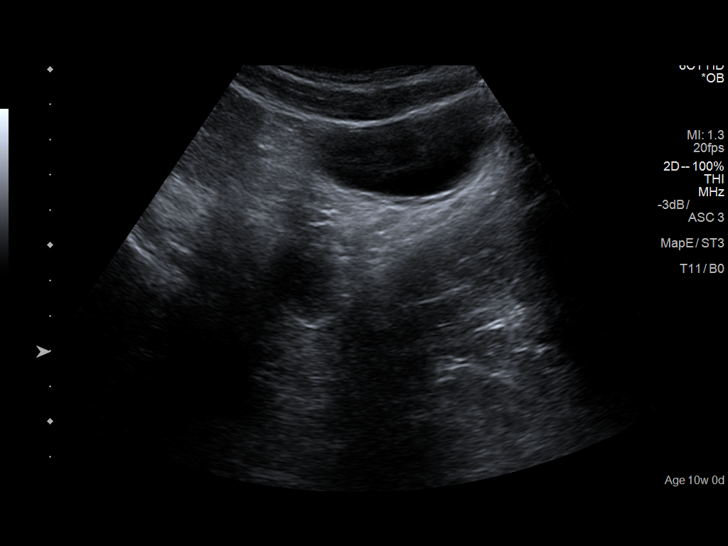
[im 50/54]
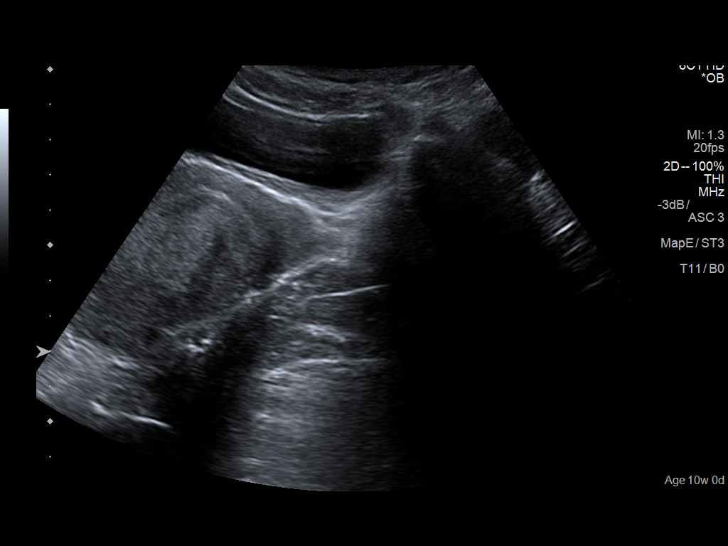
[im 54/54]
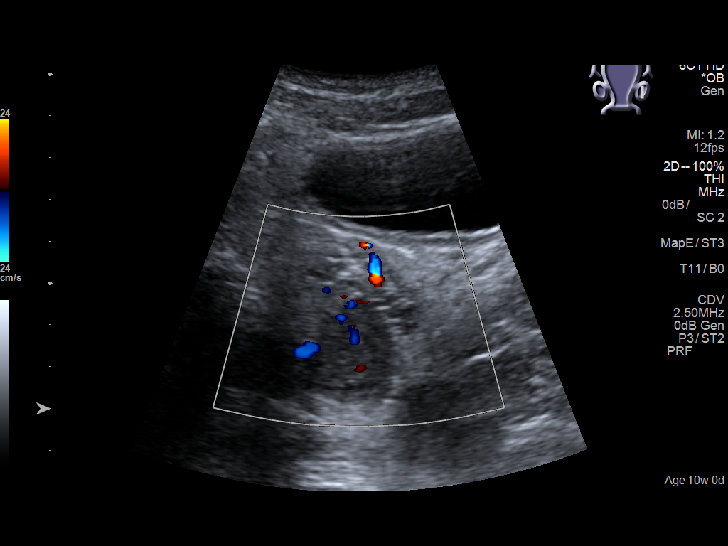

[14 of 28 positions shown; findings below may reference images not displayed]

FINDINGS: Intrauterine gestational sac: Single

Yolk sac:  Not Visualized.

Embryo:  Visualized.

Cardiac Activity: Visualized.

Heart Rate: 168 bpm

MSD:   mm    w     d

CRL:   33.6  mm   10 w 2 d                  US EDC: September 09, 2017

Subchorionic hemorrhage: The previously identified moderate
subchorionic hemorrhage is not appreciated on provided images.

Maternal uterus/adnexae: Normal
IMPRESSION: 1. Single live IUP.
2. The previously identified moderate subchorionic hemorrhage seen
on previous studies is not appreciated on provided images today.

## 2021-10-21 ENCOUNTER — Other Ambulatory Visit: Payer: Self-pay

## 2021-10-21 ENCOUNTER — Emergency Department
Admission: EM | Admit: 2021-10-21 | Discharge: 2021-10-21 | Disposition: A | Discharge status: Home / Self Care | Payer: Medicaid Other | Attending: Emergency Medicine | Admitting: Emergency Medicine

## 2021-10-21 DIAGNOSIS — F1510 Other stimulant abuse, uncomplicated: Secondary | ICD-10-CM | POA: Insufficient documentation

## 2021-10-21 DIAGNOSIS — F1520 Other stimulant dependence, uncomplicated: Secondary | ICD-10-CM

## 2021-10-21 LAB — URINALYSIS, ROUTINE W REFLEX MICROSCOPIC
Bilirubin Urine: NEGATIVE
Glucose, UA: NEGATIVE mg/dL
Ketones, ur: NEGATIVE mg/dL
Nitrite: NEGATIVE
Protein, ur: NEGATIVE mg/dL
Specific Gravity, Urine: 1.021 (ref 1.005–1.030)
pH: 5 (ref 5.0–8.0)

## 2021-10-21 LAB — COMPREHENSIVE METABOLIC PANEL
ALT: 10 U/L (ref 0–44)
AST: 14 U/L — ABNORMAL LOW (ref 15–41)
Albumin: 4.1 g/dL (ref 3.5–5.0)
Alkaline Phosphatase: 62 U/L (ref 38–126)
Anion gap: 9 (ref 5–15)
BUN: 11 mg/dL (ref 6–20)
CO2: 24 mmol/L (ref 22–32)
Calcium: 8.9 mg/dL (ref 8.9–10.3)
Chloride: 107 mmol/L (ref 98–111)
Creatinine, Ser: 0.76 mg/dL (ref 0.44–1.00)
GFR, Estimated: >60 mL/min (ref >60)
Glucose, Bld: 76 mg/dL (ref 70–99)
Potassium: 3.7 mmol/L (ref 3.5–5.1)
Sodium: 140 mmol/L (ref 135–145)
Total Bilirubin: 0.5 mg/dL (ref 0.3–1.2)
Total Protein: 7.2 g/dL (ref 6.5–8.1)

## 2021-10-21 LAB — CBC WITH DIFFERENTIAL/PLATELET
Abs Immature Granulocytes: 0.02 10*3/uL (ref 0.00–0.07)
Basophils Absolute: 0.1 10*3/uL (ref 0.0–0.1)
Basophils Relative: 1 %
Eosinophils Absolute: 0.1 10*3/uL (ref 0.0–0.5)
Eosinophils Relative: 2 %
HCT: 38.2 % (ref 36.0–46.0)
Hemoglobin: 12.4 g/dL (ref 12.0–15.0)
Immature Granulocytes: 0 %
Lymphocytes Relative: 37 %
Lymphs Abs: 2.2 10*3/uL (ref 0.7–4.0)
MCH: 31.1 pg (ref 26.0–34.0)
MCHC: 32.5 g/dL (ref 30.0–36.0)
MCV: 95.7 fL (ref 80.0–100.0)
Monocytes Absolute: 0.4 10*3/uL (ref 0.1–1.0)
Monocytes Relative: 6 %
Neutro Abs: 3.2 10*3/uL (ref 1.7–7.7)
Neutrophils Relative %: 54 %
Platelets: 239 10*3/uL (ref 150–400)
RBC: 3.99 MIL/uL (ref 3.87–5.11)
RDW: 12.7 % (ref 11.5–15.5)
WBC: 6 10*3/uL (ref 4.0–10.5)
nRBC: 0 % (ref 0.0–0.2)

## 2021-10-21 LAB — URINE DRUG SCREEN, QUALITATIVE (ARMC ONLY)
Amphetamines, Ur Screen: POSITIVE — AB
Barbiturates, Ur Screen: NOT DETECTED
Benzodiazepine, Ur Scrn: NOT DETECTED
Cannabinoid 50 Ng, Ur ~~LOC~~: POSITIVE — AB
Cocaine Metabolite,Ur ~~LOC~~: NOT DETECTED
MDMA (Ecstasy)Ur Screen: NOT DETECTED
Methadone Scn, Ur: NOT DETECTED
Opiate, Ur Screen: NOT DETECTED
Phencyclidine (PCP) Ur S: NOT DETECTED
Tricyclic, Ur Screen: NOT DETECTED

## 2021-10-21 LAB — POC URINE PREG, ED: Preg Test, Ur: NEGATIVE

## 2021-10-21 NOTE — ED Notes (Addendum)
Patient left before reviewing d/c instructions. Visualized exiting emergency department.

## 2021-10-21 NOTE — ED Provider Notes (Addendum)
[facility] Provider Note    Event Date/Time   First MD Initiated Contact with Patient 10/21/21 1544     (approximate)   History   Detox   HPI  Sarya Linenberger is a 26 y.o. female otherwise healthy who comes in with concerns for wanting detox.  Patient reports that she uses methamphetamines that she smokes.  Denies IV drug use but does report using daily.  She reports no alcohol use and occasional opioid and Klonopin use but nothing that she uses daily and does not think she use any Klonopin over the past few weeks.  She denies any SI or HI or any other medical concerns.  She reports having a 57-year-old daughter that she is trying to get clean for.     Physical Exam   Triage Vital Signs: ED Triage Vitals  Enc Vitals Group     BP 10/21/21 1429 118/88     Pulse Rate 10/21/21 1429 94     Resp 10/21/21 1429 16     Temp 10/21/21 1429 98.2 F (36.8 C)     Temp Source 10/21/21 1429 Oral     SpO2 10/21/21 1429 99 %     Weight 10/21/21 1430 98 lb (44.5 kg)     Height 10/21/21 1430 5\' 4"  (1.626 m)     Head Circumference --      Peak Flow --      Pain Score 10/21/21 1429 0     Pain Loc --      Pain Edu? --      Excl. in GC? --     Most recent vital signs: Vitals:   10/21/21 1429  BP: 118/88  Pulse: 94  Resp: 16  Temp: 98.2 F (36.8 C)  SpO2: 99%     General: Awake, no distress.  CV:  Good peripheral perfusion.  Resp:  Normal effort.  Abd:  No distention.  Other:     ED Results / Procedures / Treatments   Labs (all labs ordered are listed, but only abnormal results are displayed) Labs Reviewed  COMPREHENSIVE METABOLIC PANEL - Abnormal; Notable for the following components:      Result Value   AST 14 (*)    All other components within normal limits  URINALYSIS, ROUTINE W REFLEX MICROSCOPIC - Abnormal; Notable for the following components:   Color, Urine YELLOW (*)    APPearance CLOUDY (*)    Hgb urine dipstick SMALL (*)     Leukocytes,Ua SMALL (*)    Bacteria, UA MANY (*)    All other components within normal limits  URINE DRUG SCREEN, QUALITATIVE ([facility] ONLY) - Abnormal; Notable for the following components:   Amphetamines, Ur Screen POSITIVE (*)    Cannabinoid 50 Ng, Ur  POSITIVE (*)    All other components within normal limits  CBC WITH DIFFERENTIAL/PLATELET  POC URINE PREG, ED    MEDICATIONS ORDERED IN ED: Medications - No data to display   IMPRESSION / MDM / ASSESSMENT AND PLAN / ED COURSE  I reviewed the triage vital signs and the nursing notes.   Patient's presentation is most consistent with acute, uncomplicated illness.   Pt is without any acute medical complaints. No exam findings to suggest medical cause of current presentation. Will order psychiatric screening labs and discuss further w/ psychiatric service.  D/d includes but is not limited to psychiatric disease, behavioral/personality disorder, inadequate socioeconomic support, medical.  Labs are reassuring with no pregnancy, no electrolyte abnormalities.  CBC normal and she denies any infectious symptoms.  I discussed with [facility] he is going to give her some resources and patient will be able to be discharged afterwards with resources for drug addiction  TTS setting up transport to [facility].   Unfortunately [facility] cannot take her due to her insurance and [facility] was faxing her out for other places but patient left to the ER without any papers.      FINAL CLINICAL IMPRESSION(S) / ED DIAGNOSES   Final diagnoses:  Amphetamine addiction (HCC)     Rx / DC Orders   ED Discharge Orders     None        Note:  This document was prepared using Dragon voice recognition software and may include unintentional dictation errors.   Concha Se, MD 10/21/21 1621    Concha Se, MD 10/21/21 1713    Concha Se, MD 10/21/21 1843

## 2021-10-21 NOTE — Discharge Instructions (Signed)
Going to Rehab for addiction help.

## 2021-10-21 NOTE — BH Assessment (Signed)
Per Rosalita Chessman at [facility], they are not able to accept patient due to insurance. Patient has [employer].

## 2021-10-21 NOTE — ED Triage Notes (Addendum)
Pt presents to ED with c/o of wanting detox help with using methamphetamines, pt states she smokes this and does do it daily. Pt states also marijuana use as well. Pt denies SI or HI. Pt calm and cooperative. NAD noted, VSS in triage.   Pt states last time she used was yesterday.

## 2021-10-21 NOTE — BH Assessment (Signed)
Comprehensive Clinical Assessment (CCA) Screening, Triage and Referral Note  10/21/2021 [name] [id number]  [name], 26 year old female who presents to [facility] ED voluntarily for treatment. Per triage note, Pt presents to ED with c/o of wanting detox help with using methamphetamines, pt states she smokes this and does do it daily. Pt states marijuana use as well. Pt denies SI or HI. Pt calm and cooperative. NAD noted, VSS in triage. Pt states the last time she used was yesterday.   During [facility] assessment pt presents alert and oriented x 4, anxious but cooperative, and mood-congruent with affect. The pt does not appear to be responding to internal or external stimuli. Neither is the pt presenting with any delusional thinking. Pt verified the information provided to triage RN.   Pt identifies her main complaint to be that she wants detox for methamphetamines. Patient reports she uses about a gram per day and has been doing this for the past couple of years. Patient admits to also using marijuana. Patient reports she is not currently employed and wants to make a change for her and her 19yr old daughter. Patient states she is no longer living with her boyfriend but is now staying with her father. "It's not a good environment. I need to get away from bad influences." Patient reports she last used on yesterday. Pt denies alcohol use and other substances. Pt reports no previous INPT hx or follow up on outpatient services. Pt denies current SI/HI/AH/VH. Pt contracts for safety.   TTS provided patient with outpatient resources and also faxed patient referral to several facilities. If beds are not available, patient can be discharged with follow up to [facility].   Chief Complaint:  Chief Complaint  Patient presents with   Detox   Visit Diagnosis: Substance use  Patient Reported Information How did you hear about [place]? Self  What Is the Reason for Your Visit/Call Today? Patient wants detox for  methamphetamine use.  How Long Has This Been Causing You Problems? > than 6 months  What Do You Feel Would Help You the Most Today? Alcohol or Drug Use Treatment   Have You Recently Had Any Thoughts About Hurting Yourself? No  Are You Planning to Commit Suicide/Harm Yourself At This time? No   Have you Recently Had Thoughts About Hurting Someone [name]? No  Are You Planning to Harm Someone at This Time? No  Explanation: No data recorded  Have You Used Any Alcohol or Drugs in the Past 24 Hours? Yes  How Long Ago Did You Use Drugs or Alcohol? No data recorded What Did You Use and How Much? Methamphetamines and marijuana   Do You Currently Have a Therapist/Psychiatrist? No  Name of Therapist/Psychiatrist: No data recorded  Have You Been Recently Discharged From Any Office Practice or Programs? No  Explanation of Discharge From Practice/Program: No data recorded   CCA Screening Triage Referral Assessment Type of Contact: Face-to-Face  Telemedicine Service Delivery:   Is this Initial or Reassessment? No data recorded Date Telepsych consult ordered in [facility]:  No data recorded Time Telepsych consult ordered in [facility]:  No data recorded Location of Assessment: [facility] ED  Provider Location: [facility] ED   Collateral Involvement: None provided   Does Patient Have a Court Appointed Legal Guardian? No data recorded Name and Contact of Legal Guardian: No data recorded If Minor and Not Living with Parent(s), Who has Custody? n/a  Is CPS involved or ever been involved? In the Past  Is APS  involved or ever been involved? Never   Patient Determined To Be At Risk for Harm To Self or Others Based on Review of Patient Reported Information or Presenting Complaint? No  Method: No data recorded Availability of Means: No data recorded Intent: No data recorded Notification Required: No data recorded Additional Information for Danger to Others Potential: No data recorded Additional Comments  for Danger to Others Potential: No data recorded Are There Guns or Other Weapons in Your Home? No data recorded Types of Guns/Weapons: No data recorded Are These Weapons Safely Secured?                            No data recorded Who Could Verify You Are Able To Have These Secured: No data recorded Do You Have any Outstanding Charges, Pending Court Dates, Parole/Probation? No data recorded Contacted To Inform of Risk of Harm To Self or Others: No data recorded  Does Patient Present under Involuntary Commitment? No  IVC Papers Initial File Date: No data recorded  [place] of Residence: [place]   Patient Currently Receiving the Following Services: Not Receiving Services   Determination of Need: Emergent (2 hours)   Options For Referral: ED Visit; Chemical Dependency Intensive Outpatient Therapy (CDIOP)   Discharge Disposition:     Clerance Lav, Counselor, LCAS-A

## 2021-10-21 NOTE — BH Assessment (Signed)
Detox  Referral information for detox treatment faxed to:   [facility] ([phone] or [phone])   [facility] ([phone])   [facility] ([phone])  . [facility] ([phone])   [facility] ([phone])  . [facility] ([phone])   

## 2022-11-26 ENCOUNTER — Encounter: Payer: Self-pay | Admitting: Obstetrics and Gynecology

## 2022-11-26 ENCOUNTER — Ambulatory Visit (INDEPENDENT_AMBULATORY_CARE_PROVIDER_SITE_OTHER): Payer: Self-pay | Admitting: Obstetrics and Gynecology

## 2022-11-26 ENCOUNTER — Other Ambulatory Visit (INDEPENDENT_AMBULATORY_CARE_PROVIDER_SITE_OTHER): Payer: Self-pay

## 2022-11-26 VITALS — BP 114/73 | HR 112 | Ht 64.0 in | Wt 120.1 lb

## 2022-11-26 DIAGNOSIS — O3680X0 Pregnancy with inconclusive fetal viability, not applicable or unspecified: Secondary | ICD-10-CM

## 2022-11-26 DIAGNOSIS — Z3200 Encounter for pregnancy test, result unknown: Secondary | ICD-10-CM

## 2022-11-26 DIAGNOSIS — Z3A24 24 weeks gestation of pregnancy: Secondary | ICD-10-CM

## 2022-11-26 DIAGNOSIS — Z3480 Encounter for supervision of other normal pregnancy, unspecified trimester: Secondary | ICD-10-CM

## 2022-11-26 DIAGNOSIS — Z3689 Encounter for other specified antenatal screening: Secondary | ICD-10-CM

## 2022-11-26 DIAGNOSIS — Z3201 Encounter for pregnancy test, result positive: Secondary | ICD-10-CM

## 2022-11-26 DIAGNOSIS — N926 Irregular menstruation, unspecified: Secondary | ICD-10-CM

## 2022-11-26 LAB — POCT URINE PREGNANCY: Preg Test, Ur: POSITIVE — AB

## 2022-11-26 NOTE — Progress Notes (Signed)
HPI:      Ms. [name] is a 27 y.o. G1P0101 who LMP was No LMP recorded (lmp unknown).  Subjective:   She presents today for pregnancy, remission.  She thinks she is approximately 5 months pregnant.  She does not remember her last menstrual period.  This is her second pregnancy.  Her first pregnancy ended by cesarean delivery for breech and IUGR. Patient smokes tobacco and marijuana.  Review of chart shows remote history of positive benzodiazepines. She reports she is currently taking prenatal vitamins. She has expressed her interest in repeat cesarean delivery.    Hx: The following portions of the patient's history were reviewed and updated as appropriate:             She  has a past medical history of Abnormal uterine bleeding, Cannabis use disorder, moderate, dependence (HCC) (09/17/2016), Deliberate self-cutting (09/17/2016), MDD (major depressive disorder), single episode, moderate (HCC) (09/18/2016), Medical history non-contributory, and Tobacco use disorder (09/17/2016). She does not have any pertinent problems on file. She  has a past surgical history that includes orthopedic surgery (Left, arm) and Cesarean section (N/A, 08/20/2017). Her family history includes Breast cancer in her maternal grandmother; Cancer in her maternal grandfather; Diabetes in her mother; Hyperlipidemia in her father; Hypertension in her father and mother; Migraines in her sister; Seizures in her sister. She  reports that she has been smoking cigarettes and e-cigarettes. She uses smokeless tobacco. She reports that she does not currently use alcohol. She reports current drug use. Drug: Marijuana. She has a current medication list which includes the following prescription(s): multivitamin with minerals and prenatal vitamins. She has No Known Allergies.       Review of Systems:  Review of Systems  Constitutional: Denied constitutional symptoms, night sweats, recent illness, fatigue, fever, insomnia and weight  loss.  Eyes: Denied eye symptoms, eye pain, photophobia, vision change and visual disturbance.  Ears/Nose/Throat/Neck: Denied ear, nose, throat or neck symptoms, hearing loss, nasal discharge, sinus congestion and sore throat.  Cardiovascular: Denied cardiovascular symptoms, arrhythmia, chest pain/pressure, edema, exercise intolerance, orthopnea and palpitations.  Respiratory: Denied pulmonary symptoms, asthma, pleuritic pain, productive sputum, cough, dyspnea and wheezing.  Gastrointestinal: Denied, gastro-esophageal reflux, melena, nausea and vomiting.  Genitourinary: Denied genitourinary symptoms including symptomatic vaginal discharge, pelvic relaxation issues, and urinary complaints.  Musculoskeletal: Denied musculoskeletal symptoms, stiffness, swelling, muscle weakness and myalgia.  Dermatologic: Denied dermatology symptoms, rash and scar.  Neurologic: Denied neurology symptoms, dizziness, headache, neck pain and syncope.  Psychiatric: Denied psychiatric symptoms, anxiety and depression.  Endocrine: Denied endocrine symptoms including hot flashes and night sweats.   Meds:   Current Outpatient Medications on File Prior to Visit  Medication Sig Dispense Refill   Multiple Vitamins-Minerals (MULTIVITAMIN WITH MINERALS) tablet Take 1 tablet by mouth daily. 100 tablet 0   Prenatal Multivit-Min-Fe-FA (PRENATAL VITAMINS) 0.8 MG tablet Take 1 tablet by mouth daily.     No current facility-administered medications on file prior to visit.      Objective:     Vitals:   11/26/22 0928  BP: 114/73  Pulse: (!) 112   Filed Weights   11/26/22 0928  Weight: 120 lb 1.6 oz (54.5 kg)              ABD:  Approximately 22 weeks estimated gestational age with uterus 2 cm above umbilicus  Fetal heart tones 160.          Assessment:    G1P0101 Patient Active Problem List   Diagnosis  Date Noted   Pyelonephritis 06/01/2019   Acute pyonephrosis 06/01/2019   MDD (major depressive disorder),  single episode, moderate (HCC) 09/18/2016   Deliberate self-cutting 09/17/2016   Tobacco use disorder 09/17/2016   Cannabis use disorder, moderate, dependence (HCC) 09/17/2016     1. Possible pregnancy, not yet confirmed   2. Irregular menstrual cycle   3. Supervision of other normal pregnancy, antepartum   4. Encounter to determine fetal viability of pregnancy, single or unspecified fetus     Mid second trimester pregnancy -without first trimester prenatal care.  History of cesarean delivery-patient states that she desires repeat. (First was for breech and IUGR.   Plan:            1.  Ultrasound for size and dates  2.  Prenatal lab work including MaterniT 21  3.  Discussed cessation of smoking and marijuana use  4.  Next visit will be a new OB visit after seeing [place] [facility] for nurse visit.  Orders Orders Placed This Encounter  Procedures   US OB Comp Less 14 Wks   POCT urine pregnancy    No orders of the defined types were placed in this encounter.     F/U  Return in about 1 week (around 12/03/2022). I spent 33 minutes involved in the care of this patient preparing to see the patient by obtaining and reviewing her medical history (including labs, imaging tests and prior procedures), documenting clinical information in the electronic health record (EHR), counseling and coordinating care plans, writing and sending prescriptions, ordering tests or procedures and in direct communicating with the patient and medical staff discussing pertinent items from her history and physical exam.  Elonda Husky, M.D. 11/26/2022 10:01 AM

## 2022-11-26 NOTE — Progress Notes (Signed)
Patient presents today for pregnancy confirmation. UPT resulted in positive. LMP is unknown, she is estimating 4-5 months ago. Dating ultrasound has been ordered.

## 2022-11-27 ENCOUNTER — Telehealth: Payer: Self-pay

## 2022-11-27 ENCOUNTER — Other Ambulatory Visit: Payer: Self-pay | Admitting: Obstetrics and Gynecology

## 2022-11-27 ENCOUNTER — Other Ambulatory Visit: Payer: Self-pay

## 2022-11-27 DIAGNOSIS — Z3480 Encounter for supervision of other normal pregnancy, unspecified trimester: Secondary | ICD-10-CM

## 2022-11-27 DIAGNOSIS — O0930 Supervision of pregnancy with insufficient antenatal care, unspecified trimester: Secondary | ICD-10-CM

## 2022-11-27 DIAGNOSIS — Z3200 Encounter for pregnancy test, result unknown: Secondary | ICD-10-CM

## 2022-11-27 DIAGNOSIS — Z8759 Personal history of other complications of pregnancy, childbirth and the puerperium: Secondary | ICD-10-CM

## 2022-11-27 DIAGNOSIS — O283 Abnormal ultrasonic finding on antenatal screening of mother: Secondary | ICD-10-CM

## 2022-11-27 DIAGNOSIS — N926 Irregular menstruation, unspecified: Secondary | ICD-10-CM

## 2022-11-27 DIAGNOSIS — O3680X0 Pregnancy with inconclusive fetal viability, not applicable or unspecified: Secondary | ICD-10-CM

## 2022-11-27 NOTE — Telephone Encounter (Signed)
Left message of ultrasound appointment scheduled at [facility] for 9/11.  Patient to call back with any questions.

## 2022-11-30 ENCOUNTER — Ambulatory Visit: Payer: Self-pay

## 2022-11-30 ENCOUNTER — Telehealth: Payer: Self-pay | Admitting: Obstetrics

## 2022-11-30 NOTE — Telephone Encounter (Signed)
Reached out to pt to reschedule [facility]B intake appt that was scheduled on 11/30/2022 at 8:15.  Left message for pt to call back to reschedule.

## 2022-12-01 ENCOUNTER — Encounter: Payer: Self-pay | Admitting: Obstetrics

## 2022-12-01 NOTE — Telephone Encounter (Signed)
Reached out to pt (2x) to reschedule [facility]B intake appt that was scheduled on 11/30/2022 at 8:15.  Left message for pt to call back to reschedule.  Will send a MyChart letter to pt.

## 2022-12-03 ENCOUNTER — Encounter: Payer: Self-pay | Admitting: Obstetrics

## 2022-12-03 ENCOUNTER — Telehealth: Payer: Self-pay | Admitting: Obstetrics

## 2022-12-03 NOTE — Telephone Encounter (Signed)
Reached out to pt to reschedule NOB appt that was scheduled on 12/03/2022 at 2:55 with Missy.  Left message for pt to call back to reschedule.

## 2022-12-04 ENCOUNTER — Encounter: Payer: Self-pay | Admitting: Obstetrics

## 2022-12-04 NOTE — Telephone Encounter (Signed)
Reached out to pt (2x) to reschedule NOB appt that was scheduled on 12/03/2022 at 2:55 with Missy.  Left message for pt to call back to reschedule.  Will send a MYChart letter to pt.

## 2022-12-11 ENCOUNTER — Ambulatory Visit (INDEPENDENT_AMBULATORY_CARE_PROVIDER_SITE_OTHER): Payer: Self-pay

## 2022-12-11 DIAGNOSIS — Z3689 Encounter for other specified antenatal screening: Secondary | ICD-10-CM

## 2022-12-11 DIAGNOSIS — Z3480 Encounter for supervision of other normal pregnancy, unspecified trimester: Secondary | ICD-10-CM

## 2022-12-11 DIAGNOSIS — O0990 Supervision of high risk pregnancy, unspecified, unspecified trimester: Secondary | ICD-10-CM | POA: Insufficient documentation

## 2022-12-11 NOTE — Patient Instructions (Signed)
Third Trimester of Pregnancy  The third trimester of pregnancy is from week 28 through week 40. This is also called months 7 through 9. This trimester is when your unborn baby (fetus) is growing very fast. At the end of the ninth month, the unborn baby is about 20 inches long. It weighs about 6-10 pounds. Body changes during your third trimester Your body continues to go through many changes during this time. The changes vary and generally return to normal after the baby is born. Physical changes Your weight will continue to increase. You may gain 25-35 pounds (11-16 kg) by the end of the pregnancy. If you are underweight, you may gain 28-40 lb (about 13-18 kg). If you are overweight, you may gain 15-25 lb (about 7-11 kg). You may start to get stretch marks on your hips, belly (abdomen), and breasts. Your breasts will continue to grow and may hurt. A yellow fluid (colostrum) may leak from your breasts. This is the first milk you are making for your baby. You may have changes in your hair. Your belly button may stick out. You may have more swelling in your hands, face, or ankles. Health changes You may have heartburn. You may have trouble pooping (constipation). You may get hemorrhoids. These are swollen veins in the butt that can itch or get painful. You may have swollen veins (varicose veins) in your legs. You may have more body aches in the pelvis, back, or thighs. You may have more tingling or numbness in your hands, arms, and legs. The skin on your belly may also feel numb. You may feel short of breath as your womb (uterus) gets bigger. Other changes You may pee (urinate) more often. You may have more problems sleeping. You may notice the unborn baby "dropping," or moving lower in your belly. You may have more discharge coming from your vagina. Your joints may feel loose, and you may have pain around your pelvic bone. Follow these instructions at home: Medicines Take over-the-counter  and prescription medicines only as told by your doctor. Some medicines are not safe during pregnancy. Take a prenatal vitamin that contains at least 600 micrograms (mcg) of folic acid. Eating and drinking Eat healthy meals that include: Fresh fruits and vegetables. Whole grains. Good sources of protein, such as meat, eggs, or tofu. Low-fat dairy products. Avoid raw meat and unpasteurized juice, milk, and cheese. These carry germs that can harm you and your baby. Eat 4 or 5 small meals rather than 3 large meals a day. You may need to take these actions to prevent or treat trouble pooping: Drink enough fluids to keep your pee (urine) pale yellow. Eat foods that are high in fiber. These include beans, whole grains, and fresh fruits and vegetables. Limit foods that are high in fat and sugar. These include fried or sweet foods. Activity Exercise only as told by your doctor. Stop exercising if you start to have cramps in your womb. Avoid heavy lifting. Do not exercise if it is too hot or too humid, or if you are in a place of great height (high altitude). If you choose to, you may have sex unless your doctor tells you not to. Relieving pain and discomfort Take breaks often, and rest with your legs raised (elevated) if you have leg cramps or low back pain. Take warm water baths (sitz baths) to soothe pain or discomfort caused by hemorrhoids. Use hemorrhoid cream if your doctor approves. Wear a good support bra if your breasts are  tender. If you develop bulging, swollen veins in your legs: Wear support hose as told by your doctor. Raise your feet for 15 minutes, 3-4 times a day. Limit salt in your food. Safety Talk to your doctor before traveling far distances. Do not use hot tubs, steam rooms, or saunas. Wear your seat belt at all times when you are in a car. Talk with your doctor if someone is hurting you or yelling at you a lot. Preparing for your baby's arrival To prepare for the arrival  of your baby: Take prenatal classes. Visit the hospital and tour the maternity area. Buy a rear-facing car seat. Learn how to install it in your car. Prepare the baby's room. Take out all pillows and stuffed animals from the baby's crib. General instructions Avoid cat litter boxes and soil used by cats. These carry germs that can cause harm to the baby and can cause a loss of your baby by miscarriage or stillbirth. Do not douche or use tampons. Do not use scented sanitary pads. Do not smoke or use any products that contain nicotine or tobacco. If you need help quitting, ask your doctor. Do not drink alcohol. Do not use herbal medicines, illegal drugs, or medicines that were not approved by your doctor. Chemicals in these products can affect your baby. Keep all follow-up visits. This is important. Where to find more information American Pregnancy Association: americanpregnancy.org [employer]: www.acog.org Office on Women's Health: MightyRew[employer]co.nz Contact a doctor if: You have a fever. You have mild cramps or pressure in your lower belly. You have a nagging pain in your belly area. You vomit, or you have watery poop (diarrhea). You have bad-smelling fluid coming from your vagina. You have pain when you pee, or your pee smells bad. You have a headache that does not go away when you take medicine. You have changes in how you see, or you see spots in front of your eyes. Get help right away if: Your water breaks. You have regular contractions that are less than 5 minutes apart. You are spotting or bleeding from your vagina. You have very bad belly cramps or pain. You have trouble breathing. You have chest pain. You faint. You have not felt the baby move for the amount of time told by your doctor. You have new or increased pain, swelling, or redness in an arm or leg. Summary The third trimester is from week 28 through week 40 (months 7  through 9). This is the time when your unborn baby is growing very fast. During this time, your discomfort may increase as you gain weight and as your baby grows. Get ready for your baby to arrive by taking prenatal classes, buying a rear-facing car seat, and preparing the baby's room. Get help right away if you are bleeding from your vagina, you have chest pain and trouble breathing, or you have not felt the baby move for the amount of time told by your doctor. This information is not intended to replace advice given to you by your health care provider. Make sure you discuss any questions you have with your health care provider. Document Revised: 09/06/2019 Document Reviewed: 07/13/2019 Elsevier Patient Education  2024 [employer] Second Trimester of Pregnancy  The second trimester of pregnancy is from week 13 through week 27. This is months 4 through 6 of pregnancy. The second trimester is often a time when you feel your best. Your body has adjusted to being pregnant, and you begin  to feel better physically. During the second trimester: Morning sickness has lessened or stopped completely. You may have more energy. You may have an increase in appetite. The second trimester is also a time when the unborn baby (fetus) is growing rapidly. At the end of the sixth month, the fetus may be up to 12 inches long and weigh about 1 pounds. You will likely begin to feel the baby move (quickening) between 16 and 20 weeks of pregnancy. Body changes during your second trimester Your body continues to go through many changes during your second trimester. The changes vary and generally return to normal after the baby is born. Physical changes Your weight will continue to increase. You will notice your lower abdomen bulging out. You may begin to get stretch marks on your hips, abdomen, and breasts. Your breasts will continue to grow and to become tender. Dark spots or blotches (chloasma or mask of pregnancy)  may develop on your face. A dark line from your belly button to the pubic area (linea nigra) may appear. You may have changes in your hair. These can include thickening of your hair, rapid growth, and changes in texture. Some people also have hair loss during or after pregnancy, or hair that feels dry or thin. Health changes You may develop headaches. You may have heartburn. You may develop constipation. You may develop hemorrhoids or swollen, bulging veins (varicose veins). Your gums may bleed and may be sensitive to brushing and flossing. You may urinate more often because the fetus is pressing on your bladder. You may have back pain. This is caused by: Weight gain. Pregnancy hormones that are relaxing the joints in your pelvis. A shift in weight and the muscles that support your balance. Follow these instructions at home: Medicines Follow your health care provider's instructions regarding medicine use. Specific medicines may be either safe or unsafe to take during pregnancy. Do not take any medicines unless approved by your health care provider. Take a prenatal vitamin that contains at least 600 micrograms (mcg) of folic acid. Eating and drinking Eat a healthy diet that includes fresh fruits and vegetables, whole grains, good sources of protein such as meat, eggs, or tofu, and low-fat dairy products. Avoid raw meat and unpasteurized juice, milk, and cheese. These carry germs that can harm you and your baby. You may need to take these actions to prevent or treat constipation: Drink enough fluid to keep your urine pale yellow. Eat foods that are high in fiber, such as beans, whole grains, and fresh fruits and vegetables. Limit foods that are high in fat and processed sugars, such as fried or sweet foods. Activity Exercise only as directed by your health care provider. Most people can continue their usual exercise routine during pregnancy. Try to exercise for 30 minutes at least 5 days a  week. Stop exercising if you develop contractions in your uterus. Stop exercising if you develop pain or cramping in the lower abdomen or lower back. Avoid exercising if it is very hot or humid or if you are at a high altitude. Avoid heavy lifting. If you choose to, you may have sex unless your health care provider tells you not to. Relieving pain and discomfort Wear a supportive bra to prevent discomfort from breast tenderness. Take warm sitz baths to soothe any pain or discomfort caused by hemorrhoids. Use hemorrhoid cream if your health care provider approves. Rest with your legs raised (elevated) if you have leg cramps or low back pain. If you  develop varicose veins: Wear support hose as told by your health care provider. Elevate your feet for 15 minutes, 3-4 times a day. Limit salt in your diet. Safety Wear your seat belt at all times when driving or riding in a car. Talk with your health care provider if someone is verbally or physically abusive to you. Lifestyle Do not use hot tubs, steam rooms, or saunas. Do not douche. Do not use tampons or scented sanitary pads. Avoid cat litter boxes and soil used by cats. These carry germs that can cause birth defects in the baby and possibly loss of the fetus by miscarriage or stillbirth. Do not use herbal remedies, alcohol, illegal drugs, or medicines that are not approved by your health care provider. Chemicals in these products can harm your baby. Do not use any products that contain nicotine or tobacco, such as cigarettes, e-cigarettes, and chewing tobacco. If you need help quitting, ask your health care provider. General instructions During a routine prenatal visit, your health care provider will do a physical exam and other tests. He or she will also discuss your overall health. Keep all follow-up visits. This is important. Ask your health care provider for a referral to a local prenatal education class. Ask for help if you have counseling  or nutritional needs during pregnancy. Your health care provider can offer advice or refer you to specialists for help with various needs. Where to find more information American Pregnancy Association: americanpregnancy.org [employer]: https://www.todd-brady.net/ Office on [employer] Health: MightyReward.co.nz Contact a health care provider if you have: A headache that does not go away when you take medicine. Vision changes or you see spots in front of your eyes. Mild pelvic cramps, pelvic pressure, or nagging pain in the abdominal area. Persistent nausea, vomiting, or diarrhea. A bad-smelling vaginal discharge or foul-smelling urine. Pain when you urinate. Sudden or extreme swelling of your face, hands, ankles, feet, or legs. A fever. Get help right away if you: Have fluid leaking from your vagina. Have spotting or bleeding from your vagina. Have severe abdominal cramping or pain. Have difficulty breathing. Have chest pain. Have fainting spells. Have not felt your baby move for the time period told by your health care provider. Have new or increased pain, swelling, or redness in an arm or leg. Summary The second trimester of pregnancy is from week 13 through week 27 (months 4 through 6). Do not use herbal remedies, alcohol, illegal drugs, or medicines that are not approved by your health care provider. Chemicals in these products can harm your baby. Exercise only as directed by your health care provider. Most people can continue their usual exercise routine during pregnancy. Keep all follow-up visits. This is important. This information is not intended to replace advice given to you by your health care provider. Make sure you discuss any questions you have with your health care provider. Document Revised: 09/06/2019 Document Reviewed: 07/13/2019 Elsevier Patient Education  2024 [employer]. First Trimester of Pregnancy  The  first trimester of pregnancy starts on the first day of your last menstrual period until the end of week 12. This is also called months 1 through 3 of pregnancy. Body changes during your first trimester Your body goes through many changes during pregnancy. The changes usually return to normal after your baby is born. Physical changes You may gain or lose weight. Your breasts may grow larger and hurt. The area around your nipples may get darker. Dark spots or blotches may  develop on your face. You may have changes in your hair. Health changes You may feel like you might vomit (nauseous), and you may vomit. You may have heartburn. You may have headaches. You may have trouble pooping (constipation). Your gums may bleed. Other changes You may get tired easily. You may pee (urinate) more often. Your menstrual periods will stop. You may not feel hungry. You may want to eat certain kinds of food. You may have changes in your emotions from day to day. You may have more dreams. Follow these instructions at home: Medicines Take over-the-counter and prescription medicines only as told by your doctor. Some medicines are not safe during pregnancy. Take a prenatal vitamin that contains at least 600 micrograms (mcg) of folic acid. Eating and drinking Eat healthy meals that include: Fresh fruits and vegetables. Whole grains. Good sources of protein, such as meat, eggs, or tofu. Low-fat dairy products. Avoid raw meat and unpasteurized juice, milk, and cheese. If you feel like you may vomit, or you vomit: Eat 4 or 5 small meals a day instead of 3 large meals. Try eating a few soda crackers. Drink liquids between meals instead of during meals. You may need to take these actions to prevent or treat trouble pooping: Drink enough fluids to keep your pee (urine) pale yellow. Eat foods that are high in fiber. These include beans, whole grains, and fresh fruits and vegetables. Limit foods that are  high in fat and sugar. These include fried or sweet foods. Activity Exercise only as told by your doctor. Most people can do their usual exercise routine during pregnancy. Stop exercising if you have cramps or pain in your lower belly (abdomen) or low back. Do not exercise if it is too hot or too humid, or if you are in a place of great height (high altitude). Avoid heavy lifting. If you choose to, you may have sex unless your doctor tells you not to. Relieving pain and discomfort Wear a good support bra if your breasts are sore. Rest with your legs raised (elevated) if you have leg cramps or low back pain. If you have bulging veins (varicose veins) in your legs: Wear support hose as told by your doctor. Raise your feet for 15 minutes, 3-4 times a day. Limit salt in your food. Safety Wear your seat belt at all times when you are in a car. Talk with your doctor if someone is hurting you or yelling at you. Talk with your doctor if you are feeling sad or have thoughts of hurting yourself. Lifestyle Do not use hot tubs, steam rooms, or saunas. Do not douche. Do not use tampons or scented sanitary pads. Do not use herbal medicines, illegal drugs, or medicines that are not approved by your doctor. Do not drink alcohol. Do not smoke or use any products that contain nicotine or tobacco. If you need help quitting, ask your doctor. Avoid cat litter boxes and soil that is used by cats. These carry germs that can cause harm to the baby and can cause a loss of your baby by miscarriage or stillbirth. General instructions Keep all follow-up visits. This is important. Ask for help if you need counseling or if you need help with nutrition. Your doctor can give you advice or tell you where to go for help. Visit your dentist. At home, brush your teeth with a soft toothbrush. Floss gently. Write down your questions. Take them to your prenatal visits. Where to find more information American Pregnancy  Association: americanpregnancy.org [employer]: www.acog.org Office on Women's Health: MightyReward.co.nz Contact a doctor if: You are dizzy. You have a fever. You have mild cramps or pressure in your lower belly. You have a nagging pain in your belly area. You continue to feel like you may vomit, you vomit, or you have watery poop (diarrhea) for 24 hours or longer. You have a bad-smelling fluid coming from your vagina. You have pain when you pee. You are exposed to a disease that spreads from person to person, such as chickenpox, measles, Zika virus, HIV, or hepatitis. Get help right away if: You have spotting or bleeding from your vagina. You have very bad belly cramping or pain. You have shortness of breath or chest pain. You have any kind of injury, such as from a fall or a car crash. You have new or increased pain, swelling, or redness in an arm or leg. Summary The first trimester of pregnancy starts on the first day of your last menstrual period until the end of week 12 (months 1 through 3). Eat 4 or 5 small meals a day instead of 3 large meals. Do not smoke or use any products that contain nicotine or tobacco. If you need help quitting, ask your doctor. Keep all follow-up visits. This information is not intended to replace advice given to you by your health care provider. Make sure you discuss any questions you have with your health care provider. Document Revised: 09/06/2019 Document Reviewed: 07/13/2019 Elsevier Patient Education  2024 Elsevier Inc. Commonly Asked Questions During Pregnancy  Cats: A parasite can be excreted in cat feces.  To avoid exposure you need to have another person empty the little box.  If you must empty the litter box you will need to wear gloves.  Wash your hands after handling your cat.  This parasite can also be found in raw or undercooked meat so this should also be avoided.  Colds, Sore Throats,  Flu: Please check your medication sheet to see what you can take for symptoms.  If your symptoms are unrelieved by these medications please call the office.  Dental Work: Most any dental work Agricultural consultant recommends is permitted.  X-rays should only be taken during the first trimester if absolutely necessary.  Your abdomen should be shielded with a lead apron during all x-rays.  Please notify your provider prior to receiving any x-rays.  Novocaine is fine; gas is not recommended.  If your dentist requires a note from [place] prior to dental work please call the office and we will provide one for you.  Exercise: Exercise is an important part of staying healthy during your pregnancy.  You may continue most exercises you were accustomed to prior to pregnancy.  Later in your pregnancy you will most likely notice you have difficulty with activities requiring balance like riding a bicycle.  It is important that you listen to your body and avoid activities that put you at a higher risk of falling.  Adequate rest and staying well hydrated are a must!  If you have questions about the safety of specific activities ask your provider.    Exposure to Children with illness: Try to avoid obvious exposure; report any symptoms to [place] when noted,  If you have chicken pos, red measles or mumps, you should be immune to these diseases.   Please do not take any vaccines while pregnant unless you have checked with your OB provider.  Fetal Movement: After 28 weeks we  recommend you do "kick counts" twice daily.  Lie or sit down in a calm quiet environment and count your baby movements "kicks".  You should feel your baby at least 10 times per hour.  If you have not felt 10 kicks within the first hour get up, walk around and have something sweet to eat or drink then repeat for an additional hour.  If count remains less than 10 per hour notify your provider.  Fumigating: Follow your pest control agent's advice as to how long to stay out of  your home.  Ventilate the area well before re-entering.  Hemorrhoids:   Most over-the-counter preparations can be used during pregnancy.  Check your medication to see what is safe to use.  It is important to use a stool softener or fiber in your diet and to drink lots of liquids.  If hemorrhoids seem to be getting worse please call the office.   Hot Tubs:  Hot tubs Jacuzzis and saunas are not recommended while pregnant.  These increase your internal body temperature and should be avoided.  Intercourse:  Sexual intercourse is safe during pregnancy as long as you are comfortable, unless otherwise advised by your provider.  Spotting may occur after intercourse; report any bright red bleeding that is heavier than spotting.  Labor:  If you know that you are in labor, please go to the hospital.  If you are unsure, please call the office and let us help you decide what to do.  Lifting, straining, etc:  If your job requires heavy lifting or straining please check with your provider for any limitations.  Generally, you should not lift items heavier than that you can lift simply with your hands and arms (no back muscles)  Painting:  Paint fumes do not harm your pregnancy, but may make you ill and should be avoided if possible.  Latex or water based paints have less odor than oils.  Use adequate ventilation while painting.  Permanents & Hair Color:  Chemicals in hair dyes are not recommended as they cause increase hair dryness which can increase hair loss during pregnancy.  " Highlighting" and permanents are allowed.  Dye may be absorbed differently and permanents may not hold as well during pregnancy.  Sunbathing:  Use a sunscreen, as skin burns easily during pregnancy.  Drink plenty of fluids; avoid over heating.  Tanning Beds:  Because their possible side effects are still unknown, tanning beds are not recommended.  Ultrasound Scans:  Routine ultrasounds are performed at approximately 20 weeks.  You will  be able to see your baby's general anatomy an if you would like to know the gender this can usually be determined as well.  If it is questionable when you conceived you may also receive an ultrasound early in your pregnancy for dating purposes.  Otherwise ultrasound exams are not routinely performed unless there is a medical necessity.  Although you can request a scan we ask that you pay for it when conducted because insurance does not cover " patient request" scans.  Work: If your pregnancy proceeds without complications you may work until your due date, unless your physician or employer advises otherwise.  Round Ligament Pain/Pelvic Discomfort:  Sharp, shooting pains not associated with bleeding are fairly common, usually occurring in the second trimester of pregnancy.  They tend to be worse when standing up or when you remain standing for long periods of time.  These are the result of pressure of certain pelvic ligaments called "round ligaments".  Rest, Tylenol and heat seem to be the most effective relief.  As the womb and fetus grow, they rise out of the pelvis and the discomfort improves.  Please notify the office if your pain seems different than that described.  It may represent a more serious condition.  Common Medications Safe in Pregnancy  Acne:      Constipation:  Benzoyl Peroxide     Colace  Clindamycin      Dulcolax Suppository  Topica Erythromycin     Fibercon  Salicylic Acid      Metamucil         Miralax AVOID:        Senakot   Accutane    Cough:  Retin-A       Cough Drops  Tetracycline      Phenergan w/ Codeine if Rx  Minocycline      Robitussin (Plain & DM)  Antibiotics:     Crabs/Lice:  Ceclor       RID  Cephalosporins    AVOID:  E-Mycins      Kwell  Keflex  Macrobid/Macrodantin   Diarrhea:  Penicillin      Kao-Pectate  Zithromax      Imodium AD         PUSH FLUIDS AVOID:       Cipro     Fever:  Tetracycline      Tylenol (Regular or  Extra  Minocycline       Strength)  Levaquin      Extra Strength-Do not          Exceed 8 tabs/24 hrs Caffeine:        200mg /day (equiv. To 1 cup of coffee or  approx. 3 12 oz sodas)         Gas: Cold/Hayfever:       Gas-X  Benadryl      Mylicon  Claritin       Phazyme  **Claritin-D        Chlor-Trimeton    Headaches:  Dimetapp      ASA-Free Excedrin  Drixoral-Non-Drowsy     Cold Compress  Mucinex (Guaifenasin)     Tylenol (Regular or Extra  Sudafed/Sudafed-12 Hour     Strength)  **Sudafed PE Pseudoephedrine   Tylenol Cold & Sinus     Vicks Vapor Rub  Zyrtec  **AVOID if Problems With Blood Pressure         Heartburn: Avoid lying down for at least 1 hour after meals  Aciphex      Maalox     Rash:  Milk of Magnesia     Benadryl    Mylanta       1% Hydrocortisone Cream  Pepcid  Pepcid Complete   Sleep Aids:  Prevacid      Ambien   Prilosec       Benadryl  Rolaids       Chamomile Tea  Tums (Limit 4/day)     Unisom         Tylenol PM         Warm milk-add vanilla or  Hemorrhoids:       Sugar for taste  Anusol/Anusol H.C.  (RX: Analapram 2.5%)  Sugar Substitutes:  Hydrocortisone OTC     Ok in moderation  Preparation H      Tucks        Vaseline lotion applied to tissue with wiping    Herpes:     Throat:  Acyclovir  Oragel  Famvir  Valtrex     Vaccines:         Flu Shot Leg Cramps:       *Gardasil  Benadryl      Hepatitis A         Hepatitis B Nasal Spray:       Pneumovax  Saline Nasal Spray     Polio Booster         Tetanus Nausea:       Tuberculosis test or PPD  Vitamin B6 25 mg TID   AVOID:    Dramamine      *Gardasil  Emetrol       Live Poliovirus  Ginger Root 250 mg QID    MMR (measles, mumps &  High Complex Carbs @ Bedtime    rebella)  Sea Bands-Accupressure    Varicella (Chickenpox)  Unisom 1/2 tab TID     *No known complications           If received before Pain:         Known pregnancy;   Darvocet       Resume series  after  Lortab        Delivery  Percocet    Yeast:   Tramadol      Femstat  Tylenol 3      Gyne-lotrimin  Ultram       Monistat  Vicodin           MISC:         All Sunscreens           Hair Coloring/highlights          Insect Repellant's          (Including DEET)         Mystic Tans

## 2022-12-11 NOTE — Progress Notes (Signed)
New OB Intake  I connected with  [name] on 12/11/22 at  9:15 AM EDT by telephone  and verified that I am speaking with the correct person using two identifiers. Nurse is located at [facility] and pt is located at home.  I explained I am completing New OB Intake today. We discussed her EDD of 03/17/2023 that is based on u/s of [redacted]w[redacted]d. Pt is G2/P0101. I reviewed her allergies, medications, Medical/Surgical/OB history, and appropriate screenings. There are no cats in the home.  Based on history, this is a/an pregnancy uncomplicated .   Patient Active Problem List   Diagnosis Date Noted   Supervision of other normal pregnancy, antepartum 12/11/2022   Pyelonephritis 06/01/2019   Acute pyonephrosis 06/01/2019   MDD (major depressive disorder), single episode, moderate (HCC) 09/18/2016   Deliberate self-cutting 09/17/2016   Tobacco use disorder 09/17/2016   Cannabis use disorder, moderate, dependence (HCC) 09/17/2016    Concerns addressed today No prenatal care until now.  Delivery Plans:  Plans to deliver at [facility]  Anatomy US Explained anatomy scan Is scheduled c MFM on 12/15/22.  Labs Discussedgenetic screening with patient. Patient desires genetic testing to be drawn at new OB visit. Discussed possible labs to be drawn at new OB appointment.  COVID Vaccine Patient has had COVID vaccine.   Social Determinants of Health Food Insecurity: expresses food insecurity. Information given on local food banks. WIC Referral: Patient is interested in referral to [facility].  Transportation: Patient expressed transportation needs. Childcare: Discussed no children allowed at ultrasound appointments.   First visit review I reviewed new OB appt with pt. I explained she will have ob bloodwork and pap smear/pelvic exam if indicated. Explained pt will be seen by Guadlupe Spanish, CNM at first visit; encounter routed to appropriate provider.   Loran Senters, CMA 12/11/2022   10:00 AM

## 2022-12-15 ENCOUNTER — Encounter: Payer: Self-pay | Admitting: Obstetrics

## 2022-12-15 ENCOUNTER — Telehealth: Payer: Self-pay | Admitting: Obstetrics

## 2022-12-15 NOTE — Telephone Encounter (Signed)
Reached out to pt to reschedule NOB appt that was scheduled on 12/15/2022 at 2:55 with MS.  Left message for pt to call back to reschedule.

## 2022-12-16 ENCOUNTER — Encounter: Payer: Self-pay | Admitting: Obstetrics

## 2022-12-16 NOTE — Telephone Encounter (Signed)
Reached out to pt (2x) to reschedule NOB appt that was scheduled on 12/15/2022 at 2:55 with MS.  Left message for pt to call back to reschedule.  Will send a MyChart letter to the pt.

## 2022-12-21 ENCOUNTER — Encounter: Payer: Self-pay | Admitting: Certified Nurse Midwife

## 2022-12-23 ENCOUNTER — Ambulatory Visit: Payer: Self-pay

## 2022-12-23 ENCOUNTER — Encounter: Payer: Self-pay | Admitting: Obstetrics

## 2022-12-23 ENCOUNTER — Telehealth: Payer: Self-pay | Admitting: Obstetrics

## 2022-12-23 NOTE — Telephone Encounter (Signed)
Pt came in late for a NOB appt that was scheduled on 12/23/2022 at 9:55 with Missy.

## 2022-12-23 NOTE — Telephone Encounter (Signed)
Pt came in late for a NOB appt that was scheduled on 12/23/2022 at 9:55 with Missy.  (Pt came in around 10:35.) Scheduled another NOB appt on 12/24/2022 at 2:55 with [facility].  Advised pt to keep the 1:00 [place] that was scheduled at [facility] for today.

## 2022-12-24 ENCOUNTER — Telehealth: Payer: Self-pay

## 2022-12-24 DIAGNOSIS — Z98891 History of uterine scar from previous surgery: Secondary | ICD-10-CM | POA: Insufficient documentation

## 2022-12-24 DIAGNOSIS — O34219 Maternal care for unspecified type scar from previous cesarean delivery: Secondary | ICD-10-CM | POA: Insufficient documentation

## 2022-12-24 DIAGNOSIS — Z8751 Personal history of pre-term labor: Secondary | ICD-10-CM | POA: Insufficient documentation

## 2022-12-24 NOTE — Telephone Encounter (Signed)
Reached out to pt to reschedule NOB appt that was scheduled on 12/24/2022 at 2:55 with Autumn Messing.  Left message for pt to call back to reschedule.

## 2022-12-25 NOTE — Telephone Encounter (Signed)
Reached out to pt (2x) to reschedule NOB appt that was scheduled on 12/24/2022 at 2:55 with Autumn Messing.  Was able to get the pt rescheduled to 12/29/2022 at 2:55 with MMF.

## 2022-12-27 NOTE — Progress Notes (Signed)
NO SHOW for visit

## 2022-12-29 ENCOUNTER — Encounter: Payer: Self-pay | Admitting: Obstetrics

## 2022-12-29 ENCOUNTER — Telehealth: Payer: Self-pay | Admitting: Obstetrics

## 2022-12-29 NOTE — Telephone Encounter (Signed)
Reached out to pt to reschedule NOB appt that was scheduled on 12/29/2022 at 2:55 with MMF.  Left message for pt to call back to reschedule.

## 2022-12-30 ENCOUNTER — Other Ambulatory Visit: Payer: Self-pay

## 2022-12-30 NOTE — Progress Notes (Deleted)
Pt was a "No Show" for her MFM ultrasound today.

## 2022-12-30 NOTE — Telephone Encounter (Signed)
Reached out to pt (2x) to reschedule NOB appt that was scheduled on 12/29/2022 at 2:55 with MMF.  Was able to reschedule NOB appt for 01/04/2023 at 2:55 with Quitman Livings.

## 2023-01-04 ENCOUNTER — Telehealth: Payer: Self-pay | Admitting: Obstetrics

## 2023-01-04 ENCOUNTER — Encounter: Payer: Self-pay | Admitting: Obstetrics

## 2023-01-04 NOTE — Telephone Encounter (Signed)
Reached out to pt to reschedule NOB appt that was scheduled on 01/04/2023 at 2:55 with Missy.  Left message for pt to call back to reschedule.

## 2023-01-05 NOTE — Telephone Encounter (Signed)
Reached out to pt (2x) to reschedule NOB appt that was scheduled on 01/04/2023 at 2:55 with Missy.  Was able to reschedule the appt for 01/08/2023 with MMF at 2:55.

## 2023-01-08 ENCOUNTER — Telehealth: Payer: Self-pay | Admitting: Obstetrics

## 2023-01-08 ENCOUNTER — Encounter: Payer: Self-pay | Admitting: Obstetrics

## 2023-01-08 NOTE — Telephone Encounter (Signed)
Reached out to pt to reschedule NOB appt that was scheduled on 01/08/2023 at 2:55 with MMF.  Phone was answered, but then was hung up.  Could not leave a message.

## 2023-01-11 ENCOUNTER — Encounter: Payer: Self-pay | Admitting: Obstetrics

## 2023-01-11 NOTE — Telephone Encounter (Signed)
Reached out to pt (2x) to reschedule NOB appt that was scheduled on 01/08/2023 at 2:55 with MMF.  Left message for pt to call back to reschedule.  Will send a MyChart letter to pt.

## 2023-01-14 ENCOUNTER — Encounter: Payer: Self-pay | Admitting: Obstetrics
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
## 2023-01-15 ENCOUNTER — Encounter: Payer: Self-pay | Admitting: Certified Nurse Midwife

## 2023-01-15 ENCOUNTER — Ambulatory Visit (INDEPENDENT_AMBULATORY_CARE_PROVIDER_SITE_OTHER): Payer: Self-pay | Admitting: Certified Nurse Midwife

## 2023-01-15 ENCOUNTER — Other Ambulatory Visit (HOSPITAL_COMMUNITY)
Admission: RE | Admit: 2023-01-15 | Discharge: 2023-01-15 | Disposition: A | Discharge status: Home / Self Care | Payer: Self-pay | Source: Ambulatory Visit

## 2023-01-15 VITALS — BP 124/80 | HR 118 | Wt 126.4 lb

## 2023-01-15 DIAGNOSIS — Z113 Encounter for screening for infections with a predominantly sexual mode of transmission: Secondary | ICD-10-CM

## 2023-01-15 DIAGNOSIS — Z1379 Encounter for other screening for genetic and chromosomal anomalies: Secondary | ICD-10-CM

## 2023-01-15 DIAGNOSIS — O99323 Drug use complicating pregnancy, third trimester: Secondary | ICD-10-CM

## 2023-01-15 DIAGNOSIS — Z131 Encounter for screening for diabetes mellitus: Secondary | ICD-10-CM

## 2023-01-15 DIAGNOSIS — Z23 Encounter for immunization: Secondary | ICD-10-CM

## 2023-01-15 DIAGNOSIS — Z130 Encounter for screening for diseases of the blood and blood-forming organs and certain disorders involving the immune mechanism: Secondary | ICD-10-CM

## 2023-01-15 DIAGNOSIS — Z124 Encounter for screening for malignant neoplasm of cervix: Secondary | ICD-10-CM

## 2023-01-15 DIAGNOSIS — Z3483 Encounter for supervision of other normal pregnancy, third trimester: Secondary | ICD-10-CM

## 2023-01-15 DIAGNOSIS — Z3A31 31 weeks gestation of pregnancy: Secondary | ICD-10-CM
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
## 2023-01-15 NOTE — Patient Instructions (Signed)
 Preterm Labor Pregnancy normally lasts 39-41 weeks. Preterm labor is when labor starts before you have been pregnant for 37 weeks. Babies who are born too early may have a higher risk for long-term problems like cerebral palsy or developmental delays. They may also have problems soon after birth, such as problems with blood sugar, body temperature, heart, and breathing. These problems may be very serious in babies who are born before 34 weeks of pregnancy. What are the causes? The cause of this condition is not known. What increases the risk? You are more likely to have preterm labor if: You have medical problems, now or in the past. You have problems now or in your past pregnancies. You have lifestyle problems. Medical history You have problems of the womb (uterus). You have an infection, including infections you get from sex. You have problems that do not go away, such as: Blood clots. High blood pressure. High blood sugar. You have low body weight or too much body weight. Present and past pregnancies You have had preterm labor before. You are pregnant with two babies or more. You have a condition in which the placenta covers your cervix. You waited less than 18 months between giving birth and becoming pregnant again. Your unborn baby has some problems. You have bleeding from your vagina. You became pregnant by a method called IVF. Lifestyle You smoke. You drink alcohol. You use drugs. You have stress. You have abuse in your home. You come in contact with chemicals that harm the body (pollutants). Other factors You are younger than 17 years or older than 35 years. What are the signs or symptoms? Symptoms of this condition include: Cramps. The cramps may feel like cramps from a period. You may also have watery poop (diarrhea). Pain in the belly (abdomen). Pain in the lower back. Regular contractions. It may feel like your belly is getting tighter. Pressure in the lower  belly. More fluid leaking from the vagina. The fluid may be watery or bloody. Water breaking. How is this treated? Treatment for this condition depends on your health, the health of your baby, and how old your pregnancy is. It may include: Taking medicines, such as: Hormone medicines. Medicines to stop contractions. Medicines to help mature the baby's lungs. Medicines to prevent your baby from getting cerebral palsy or other problems. Bed rest. If the labor happens before 34 weeks of pregnancy, you may need to stay in the hospital. Delivering the baby. Follow these instructions at home:  Do not smoke or use any products that contain nicotine or tobacco. If you need help quitting, ask your doctor. Do not drink alcohol. Take over-the-counter and prescription medicines only as told by your doctor. Rest as told by your doctor. Return to your normal activities when your doctor says that it is safe. Keep all follow-up visits. How is this prevented? To have a healthy pregnancy: Do not use drugs. Do not use any medicines unless you ask your doctor if they are safe for you. Talk with your doctor before taking any herbal supplements. Make sure you gain enough weight. Watch for infection. If you think you might have an infection, get it checked right away. Symptoms of infection may include: Fever. Vaginal discharge that smells bad or is not normal. Pain or burning when you pee. Needing to pee urgently. Needing to pee often. Peeing small amounts often. Blood in your pee. Pee that smells bad or unusual. Where to find more information U.S. Department of Health and [employer]  Office on [employer] Health: http://hoffman.com/ [employer] of Obstetricians and Gynecologists: www.acog.org Centers for Disease Control and Prevention: FootballExhibition.com.br Contact a doctor if: You think you are going into preterm labor. You have symptoms of preterm labor. You have symptoms of infection. Get help  right away if: You are having painful contractions every 5 minutes or less. Your water breaks. Summary Preterm labor is labor that starts before you reach 37 weeks of pregnancy. Your baby may have problems if delivered early. You are more likely to have preterm labor if you have certain medical problems or problems with a pregnancy now or in the past. Some lifestyle factors can also increase the risk. Contact a doctor if you have symptoms of preterm labor. This information is not intended to replace advice given to you by your health care provider. Make sure you discuss any questions you have with your health care provider. Document Revised: 03/29/2020 Document Reviewed: 04/02/2020 Elsevier Patient Education  2024 [employer].

## 2023-01-16 ENCOUNTER — Encounter: Payer: Self-pay | Admitting: Licensed Practical Nurse

## 2023-01-16 DIAGNOSIS — O99019 Anemia complicating pregnancy, unspecified trimester: Secondary | ICD-10-CM | POA: Insufficient documentation

## 2023-01-16 LAB — URINALYSIS
Bilirubin, UA: NEGATIVE
Ketones, UA: NEGATIVE
Nitrite, UA: NEGATIVE
Protein,UA: NEGATIVE
RBC, UA: NEGATIVE
Specific Gravity, UA: 1.016 (ref 1.005–1.030)
Urobilinogen, Ur: 0.2 mg/dL (ref 0.2–1.0)
pH, UA: 7 (ref 5.0–7.5)

## 2023-01-16 LAB — 28 WEEK RH+PANEL
Basophils Absolute: 0 10*3/uL (ref 0.0–0.2)
Basos: 0 %
EOS (ABSOLUTE): 0.1 10*3/uL (ref 0.0–0.4)
Eos: 1 %
Gestational Diabetes Screen: 105 mg/dL (ref 70–139)
HIV Screen 4th Generation wRfx: NONREACTIVE
Hematocrit: 29.8 % — ABNORMAL LOW (ref 34.0–46.6)
Hemoglobin: 9.5 g/dL — ABNORMAL LOW (ref 11.1–15.9)
Immature Grans (Abs): 0.1 10*3/uL (ref 0.0–0.1)
Immature Granulocytes: 1 %
Lymphocytes Absolute: 2 10*3/uL (ref 0.7–3.1)
Lymphs: 18 %
MCH: 30.5 pg (ref 26.6–33.0)
MCHC: 31.9 g/dL (ref 31.5–35.7)
MCV: 96 fL (ref 79–97)
Monocytes Absolute: 0.6 10*3/uL (ref 0.1–0.9)
Monocytes: 5 %
Neutrophils Absolute: 8.3 10*3/uL — ABNORMAL HIGH (ref 1.4–7.0)
Neutrophils: 75 %
Platelets: 277 10*3/uL (ref 150–450)
RBC: 3.11 x10E6/uL — ABNORMAL LOW (ref 3.77–5.28)
RDW: 13.4 % (ref 11.7–15.4)
RPR Ser Ql: NONREACTIVE
WBC: 11 10*3/uL — ABNORMAL HIGH (ref 3.4–10.8)

## 2023-01-19 LAB — CBC/D/PLT+RPR+RH+ABO+RUBIGG...
Antibody Screen: NEGATIVE
Basophils Absolute: 0 10*3/uL (ref 0.0–0.2)
Basos: 0 %
EOS (ABSOLUTE): 0.1 10*3/uL (ref 0.0–0.4)
Eos: 1 %
HCV Ab: NONREACTIVE
HIV Screen 4th Generation wRfx: NONREACTIVE
Hematocrit: 28.6 % — ABNORMAL LOW (ref 34.0–46.6)
Hemoglobin: 9.5 g/dL — ABNORMAL LOW (ref 11.1–15.9)
Hepatitis B Surface Ag: NEGATIVE
Immature Grans (Abs): 0.1 10*3/uL (ref 0.0–0.1)
Immature Granulocytes: 1 %
Lymphocytes Absolute: 1.9 10*3/uL (ref 0.7–3.1)
Lymphs: 17 %
MCH: 31.5 pg (ref 26.6–33.0)
MCHC: 33.2 g/dL (ref 31.5–35.7)
MCV: 95 fL (ref 79–97)
Monocytes Absolute: 0.7 10*3/uL (ref 0.1–0.9)
Monocytes: 6 %
Neutrophils Absolute: 8.3 10*3/uL — ABNORMAL HIGH (ref 1.4–7.0)
Neutrophils: 75 %
Platelets: 274 10*3/uL (ref 150–450)
RBC: 3.02 x10E6/uL — ABNORMAL LOW (ref 3.77–5.28)
RDW: 13.7 % (ref 11.7–15.4)
RPR Ser Ql: REACTIVE — AB
Rh Factor: POSITIVE
Rubella Antibodies, IGG: 4.84 {index} (ref >0.99)
Varicella zoster IgG: REACTIVE
WBC: 11.1 10*3/uL — ABNORMAL HIGH (ref 3.4–10.8)

## 2023-01-19 LAB — RPR, QUANT+TP ABS (REFLEX)
Rapid Plasma Reagin, Quant: 1:1 {titer} — ABNORMAL HIGH
T Pallidum Abs: REACTIVE — AB

## 2023-01-19 LAB — CULTURE, OB URINE

## 2023-01-19 LAB — URINE CULTURE, OB REFLEX

## 2023-01-19 LAB — HCV INTERPRETATION

## 2023-01-19 NOTE — Telephone Encounter (Signed)
Unable to reach patient. Number not in service.

## 2023-01-19 NOTE — Telephone Encounter (Signed)
Patient lives in [place]. Spoke with Cheryln Manly in Communicable Disease to inquire number for patient to contact for treatment. [phone]. Gearldine Bienenstock states someone from [facility] should be reaching out to patient to schedule treatment. They had an address of [address] for the patient. New address [place] given. Advised we are unable to reach patient via phone (number not is service for her or mom-emergency contact). She does not check my chart. We discussed possibility of [facility] Social worker doing a home visit.

## 2023-01-19 NOTE — Telephone Encounter (Signed)
Letter created/printed/mailed to patient.

## 2023-01-19 NOTE — Telephone Encounter (Signed)
Unable to reach patient's mom (emergency contact). Number not in service.

## 2023-01-20 ENCOUNTER — Telehealth: Payer: Self-pay

## 2023-01-20 NOTE — Telephone Encounter (Signed)
Aletha Halim from health department had reached out to our office in regards to patient. He inquired when patient was tested for RPR and previous dates available. He was advised that our office had tried reaching out to patient by phone and had contacted her emergency contact and that both phones are not in service. Numbers were provided to Mr. Teola Bradley at his request who states that he will attempt to reach out to patient to follow up. KW

## 2023-01-20 NOTE — Telephone Encounter (Signed)
 LM for pt to return my call.  Berdie Ogren, RN

## 2023-01-21 LAB — MATERNIT 21 PLUS CORE, BLOOD
Fetal Fraction: 38
Result (T21): NEGATIVE
Trisomy 13 (Patau syndrome): NEGATIVE
Trisomy 18 (Edwards syndrome): NEGATIVE
Trisomy 21 (Down syndrome): NEGATIVE

## 2023-01-21 LAB — CYTOLOGY - PAP
Chlamydia: POSITIVE — AB
Comment: NEGATIVE
Comment: NORMAL
Diagnosis: NEGATIVE
Diagnosis: REACTIVE
Neisseria Gonorrhea: NEGATIVE

## 2023-01-22 LAB — MONITOR DRUG PROFILE 14(MW)
BARBITURATE SCREEN URINE: NEGATIVE ng/mL
BENZODIAZEPINE SCREEN, URINE: NEGATIVE ng/mL
Buprenorphine, Urine: NEGATIVE ng/mL
Cocaine (Metab) Scrn, Ur: NEGATIVE ng/mL
Creatinine(Crt), U: 57.6 mg/dL (ref 20.0–300.0)
Fentanyl, Urine: NEGATIVE pg/mL
Meperidine Screen, Urine: NEGATIVE ng/mL
Methadone Screen, Urine: NEGATIVE ng/mL
OXYCODONE+OXYMORPHONE UR QL SCN: NEGATIVE ng/mL
Opiate Scrn, Ur: NEGATIVE ng/mL
Ph of Urine: 6.6 (ref 4.5–8.9)
Phencyclidine Qn, Ur: NEGATIVE ng/mL
Propoxyphene Scrn, Ur: NEGATIVE ng/mL
SPECIFIC GRAVITY: 1.021
Tramadol Screen, Urine: NEGATIVE ng/mL

## 2023-01-22 LAB — AMPHETAMINE (GC/MS), URINE
AMPHETAMINE GC/MS CONF: >3000 ng/mL
Amphetamine: POSITIVE — AB
Amphetamines: POSITIVE — AB
METHAMPHETAMINE (GC/MS): >3000 ng/mL
Methamphetamine: POSITIVE — AB

## 2023-01-22 LAB — CANNABINOID (GC/MS), URINE
Cannabinoid: POSITIVE — AB
Carboxy THC (GC/MS): 88 ng/mL

## 2023-01-22 LAB — NICOTINE SCREEN, URINE: Cotinine Ql Scrn, Ur: POSITIVE ng/mL — AB

## 2023-01-26 ENCOUNTER — Other Ambulatory Visit: Payer: Self-pay | Admitting: Certified Nurse Midwife

## 2023-01-26 MED ORDER — FUSION PLUS PO CAPS
1.0000 | ORAL_CAPSULE | Freq: Every day | ORAL | 11 refills | Status: DC
Start: 2023-01-26 — End: 2023-11-15

## 2023-01-26 MED ORDER — AZITHROMYCIN 500 MG PO TABS: 1000.0000 mg | ORAL_TABLET | Freq: Once | ORAL | 0 refills | Status: AC

## 2023-01-27 ENCOUNTER — Other Ambulatory Visit: Payer: Self-pay

## 2023-01-27 MED ORDER — AZITHROMYCIN 500 MG PO TABS: 1000.0000 mg | ORAL_TABLET | Freq: Once | ORAL | 0 refills | Status: AC

## 2023-01-28 ENCOUNTER — Ambulatory Visit: Payer: Self-pay

## 2023-01-29 ENCOUNTER — Encounter: Payer: Self-pay | Admitting: Obstetrics and Gynecology

## 2023-01-29 ENCOUNTER — Telehealth: Payer: Self-pay | Admitting: Obstetrics and Gynecology

## 2023-01-29 NOTE — Telephone Encounter (Signed)
Reached out to pt to reschedule ROB appt that was scheduled on 01/29/2023 at 2:55 with Dr.Cherry.  Left message for pt to call back.

## 2023-02-01 ENCOUNTER — Telehealth: Payer: Self-pay | Admitting: Obstetrics and Gynecology

## 2023-02-01 ENCOUNTER — Encounter: Payer: Self-pay | Admitting: Obstetrics and Gynecology

## 2023-02-01 NOTE — Telephone Encounter (Signed)
No further action is needed.

## 2023-02-01 NOTE — Telephone Encounter (Signed)
Reached out to pt (2x) to reschedule ROB appt that was scheduled on 01/26/2023 at 2:55 with Dr. Valentino Saxon.  Left message for pt to call back.  Will send a MyChart letter.

## 2023-02-02 ENCOUNTER — Ambulatory Visit: Payer: Self-pay

## 2023-02-02 ENCOUNTER — Ambulatory Visit: Payer: Self-pay | Admitting: Family Medicine

## 2023-02-02 ENCOUNTER — Encounter: Payer: Self-pay | Admitting: Family Medicine

## 2023-02-02 DIAGNOSIS — A539 Syphilis, unspecified: Secondary | ICD-10-CM

## 2023-02-02 DIAGNOSIS — L258 Unspecified contact dermatitis due to other agents: Secondary | ICD-10-CM

## 2023-02-02 DIAGNOSIS — Z113 Encounter for screening for infections with a predominantly sexual mode of transmission: Secondary | ICD-10-CM

## 2023-02-02 DIAGNOSIS — A749 Chlamydial infection, unspecified: Secondary | ICD-10-CM

## 2023-02-02 LAB — HM HIV SCREENING LAB: HM HIV Screening: NEGATIVE

## 2023-02-02 LAB — HEPATITIS B SURFACE ANTIGEN: Hepatitis B Surface Ag: NONREACTIVE

## 2023-02-02 LAB — HM HEPATITIS C SCREENING LAB: HM Hepatitis Screen: NEGATIVE

## 2023-02-02 MED ORDER — AZITHROMYCIN 500 MG PO TABS: 1000.0000 mg | ORAL_TABLET | Freq: Once | ORAL | Status: DC

## 2023-02-02 MED ORDER — AZITHROMYCIN 500 MG PO TABS: 1000.0000 mg | ORAL_TABLET | Freq: Every day | ORAL | 0 refills | Status: AC

## 2023-02-02 MED ORDER — PENICILLIN G BENZATHINE 1200000 UNIT/2ML IM SUSY
2.4000 10*6.[IU] | PREFILLED_SYRINGE | INTRAMUSCULAR | Status: DC
  Administered 2023-02-02: 2.4 10*6.[IU] via INTRAMUSCULAR

## 2023-02-02 NOTE — Progress Notes (Unsigned)
Pt is here for Syphilis and Chlamydia treatment and Syphilis testing.  Bicillin 2.4 MU given IM without any complications.  The patient was dispensed Azithromycin 500 mg #2 today. I provided counseling today regarding the medication. We discussed the medication, the side effects and when to call clinic. Patient given the opportunity to ask questions. Questions answered.  Pt given reminder card to return 10/29 and 11/5 @ 2 pm.  Berdie Ogren, RN

## 2023-02-02 NOTE — Progress Notes (Unsigned)
[name] Department  STI clinic/screening visit [address] [place] [address] [phone]  Subjective:  [name] is a 27 y.o. female being seen today for an STI screening visit. The patient reports they {Actions; do/do not:19616} have symptoms.  Patient reports that they {Actions; do/do not:19616} desire a pregnancy in the next year.   They reported they {Actions; are/are not:16769} interested in discussing contraception today.    No LMP recorded (lmp unknown). Patient is pregnant.  Patient has the following medical conditions:   Patient Active Problem List   Diagnosis Date Noted  . Anemia affecting pregnancy 01/16/2023  . Drug use affecting pregnancy in third trimester 01/15/2023  . History of cesarean delivery 12/24/2022  . History of preterm delivery 12/24/2022  . Supervision of high risk pregnancy, antepartum 12/11/2022  . MDD (major depressive disorder), single episode, moderate (HCC) 09/18/2016  . Deliberate self-cutting 09/17/2016  . Tobacco use disorder 09/17/2016  . Cannabis use disorder, moderate, dependence (HCC) 09/17/2016    Chief Complaint  Patient presents with  . SEXUALLY TRANSMITTED DISEASE    Screening.  Treatment for Syphilis and Chlamydia    HPI  Patient reports ***  Does the patient using douching products? No  Last HIV test per patient/review of record was No results found for: "HMHIVSCREEN"  Lab Results  Component Value Date   HIV Non Reactive 01/15/2023     Last HEPC test per patient/review of record was No results found for: "HMHEPCSCREEN" No components found for: "HEPC"   Last HEPB test per patient/review of record was No components found for: "HMHEPBSCREEN" No components found for: "HEPC"   Patient reports last pap was  Lab Results  Component Value Date   DIAGPAP  01/15/2023    - Negative for Intraepithelial Lesions or Malignancy (NILM)   DIAGPAP - Benign reactive/reparative changes 01/15/2023     Lab Results  Component Value Date   SPECADGYN Comment 03/01/2017    Screening for MPX risk: Does the patient have an unexplained rash? Yes Is the patient MSM? No Does the patient endorse multiple sex partners or anonymous sex partners? Yes Did the patient have close or sexual contact with a person diagnosed with MPX? No Has the patient traveled outside the [place] where MPX is endemic? No Is there a high clinical suspicion for MPX-- evidenced by one of the following No  -Unlikely to be chickenpox  -Lymphadenopathy  -Rash that present in same phase of evolution on any given body part See flowsheet for further details and programmatic requirements.   Immunization history:  Immunization History  Administered Date(s) Administered  . Tdap 06/29/2017, 08/22/2017, 01/15/2023     The following portions of the patient's history were reviewed and updated as appropriate: allergies, current medications, past medical history, past social history, past surgical history and problem list.  Objective:  There were no vitals filed for this visit.  Physical Exam   Assessment and Plan:  [name] is a 27 y.o. female presenting to the [facility] Department for STI screening  1. Syphilis  - penicillin g benzathine (BICILLIN LA) 1200000 UNIT/2ML injection 2.4 Million Units  2. Screening for venereal disease  - Syphilis Serology, [facility] - HIV/HCV [facility] - HBV Antigen/Antibody [facility]  3. Chlamydia  - azithromycin (ZITHROMAX) tablet 1,000 mg   Patient accepted all screenings including ***oral, vaginal CT/GC and bloodwork for HIV/RPR, and wet prep. Patient meets criteria for HepB screening? {yes/no:20286}. Ordered? {Response; yes/no/na:63}  Patient meets criteria for HepC screening? {yes/no:20286}. Ordered? {Response; yes/no/na:63}  Treat wet prep per standing order Discussed time line for State Lab results and that patient will be called with positive  results and encouraged patient to call if she had not heard in 2 weeks.  Counseled to return or seek care for continued or worsening symptoms Recommended repeat testing in 3 months with positive results. Recommended condom use with all sex  Patient is currently using {CCO Contraception:21020264} to prevent pregnancy.    No follow-ups on file.  Future Appointments  Date Time Provider Department Center  02/08/2023  1:50 PM [facility] PROVIDER [facility] None  02/15/2023  1:00 PM [facility]    Edmonia James, NP

## 2023-02-04 NOTE — Progress Notes (Signed)

## 2023-02-08 ENCOUNTER — Ambulatory Visit: Payer: Self-pay

## 2023-02-09 ENCOUNTER — Ambulatory Visit: Payer: Self-pay

## 2023-02-09 ENCOUNTER — Telehealth: Payer: Self-pay

## 2023-02-09 NOTE — Telephone Encounter (Signed)
 LM for pt to return my call.  Berdie Ogren, RN

## 2023-02-11 ENCOUNTER — Telehealth: Payer: Self-pay | Admitting: Family Medicine

## 2023-02-11 NOTE — Telephone Encounter (Signed)
 LM for pt to return my call.  Berdie Ogren, RN

## 2023-02-11 NOTE — Telephone Encounter (Signed)
Called [facility] syphilis Therapist, sports, spoke with [employer]. He is not monitoring this patient but was able to discuss clinical recommendations and will pass it on to her case manager, Denyse Amass.   Even though the syphilis testing from 10/22 came back with RPR non-reactive and syphilis TP reactive, because she is pregnant she will need to receive all 3 bicillin because of possibility of vertical transmission.   Will forward to our STI coordinator, Justice Rocher.   Fayette Pho, MD 02/11/23  12:27 PM

## 2023-02-12 ENCOUNTER — Telehealth: Payer: Self-pay

## 2023-02-12 NOTE — Telephone Encounter (Signed)
Pt returned my call.  Appointment made for 11/4 @ 10:00 am for pt to see a Provider and have treatment.  Berdie Ogren, RN

## 2023-02-12 NOTE — Telephone Encounter (Signed)
 LM for pt to return my call.  Berdie Ogren, RN

## 2023-02-15 ENCOUNTER — Encounter: Payer: Self-pay | Admitting: Advanced Practice Midwife

## 2023-02-15 ENCOUNTER — Inpatient Hospital Stay: Payer: Self-pay | Admitting: Anesthesiology

## 2023-02-15 ENCOUNTER — Other Ambulatory Visit: Payer: Self-pay

## 2023-02-15 ENCOUNTER — Ambulatory Visit: Payer: Self-pay

## 2023-02-15 ENCOUNTER — Inpatient Hospital Stay
Admission: EM | Admit: 2023-02-15 | Discharge: 2023-02-17 | DRG: 786 | Disposition: A | Discharge status: Home / Self Care | Payer: MEDICAID | Attending: Obstetrics | Admitting: Obstetrics

## 2023-02-15 ENCOUNTER — Encounter
Admission: EM | Disposition: A | Discharge status: Home / Self Care | Payer: Self-pay | Source: Home / Self Care | Attending: Obstetrics

## 2023-02-15 DIAGNOSIS — O34219 Maternal care for unspecified type scar from previous cesarean delivery: Secondary | ICD-10-CM | POA: Insufficient documentation

## 2023-02-15 DIAGNOSIS — Z8249 Family history of ischemic heart disease and other diseases of the circulatory system: Secondary | ICD-10-CM

## 2023-02-15 DIAGNOSIS — Z803 Family history of malignant neoplasm of breast: Secondary | ICD-10-CM

## 2023-02-15 DIAGNOSIS — N390 Urinary tract infection, site not specified: Secondary | ICD-10-CM | POA: Diagnosis present

## 2023-02-15 DIAGNOSIS — Z30017 Encounter for initial prescription of implantable subdermal contraceptive: Secondary | ICD-10-CM

## 2023-02-15 DIAGNOSIS — O0930 Supervision of pregnancy with insufficient antenatal care, unspecified trimester: Secondary | ICD-10-CM

## 2023-02-15 DIAGNOSIS — F17200 Nicotine dependence, unspecified, uncomplicated: Secondary | ICD-10-CM | POA: Diagnosis present

## 2023-02-15 DIAGNOSIS — K219 Gastro-esophageal reflux disease without esophagitis: Secondary | ICD-10-CM | POA: Diagnosis present

## 2023-02-15 DIAGNOSIS — F17210 Nicotine dependence, cigarettes, uncomplicated: Secondary | ICD-10-CM | POA: Diagnosis present

## 2023-02-15 DIAGNOSIS — O99334 Smoking (tobacco) complicating childbirth: Secondary | ICD-10-CM | POA: Diagnosis present

## 2023-02-15 DIAGNOSIS — O09213 Supervision of pregnancy with history of pre-term labor, third trimester: Secondary | ICD-10-CM

## 2023-02-15 DIAGNOSIS — O99019 Anemia complicating pregnancy, unspecified trimester: Secondary | ICD-10-CM | POA: Diagnosis present

## 2023-02-15 DIAGNOSIS — O321XX0 Maternal care for breech presentation, not applicable or unspecified: Secondary | ICD-10-CM | POA: Diagnosis present

## 2023-02-15 DIAGNOSIS — D509 Iron deficiency anemia, unspecified: Secondary | ICD-10-CM | POA: Diagnosis present

## 2023-02-15 DIAGNOSIS — O0933 Supervision of pregnancy with insufficient antenatal care, third trimester: Secondary | ICD-10-CM

## 2023-02-15 DIAGNOSIS — Z833 Family history of diabetes mellitus: Secondary | ICD-10-CM

## 2023-02-15 DIAGNOSIS — O9962 Diseases of the digestive system complicating childbirth: Secondary | ICD-10-CM | POA: Diagnosis present

## 2023-02-15 DIAGNOSIS — O3483 Maternal care for other abnormalities of pelvic organs, third trimester: Secondary | ICD-10-CM | POA: Diagnosis present

## 2023-02-15 DIAGNOSIS — O42013 Preterm premature rupture of membranes, onset of labor within 24 hours of rupture, third trimester: Principal | ICD-10-CM | POA: Diagnosis present

## 2023-02-15 DIAGNOSIS — Z811 Family history of alcohol abuse and dependence: Secondary | ICD-10-CM

## 2023-02-15 DIAGNOSIS — O34211 Maternal care for low transverse scar from previous cesarean delivery: Secondary | ICD-10-CM | POA: Diagnosis present

## 2023-02-15 DIAGNOSIS — Z98891 History of uterine scar from previous surgery: Secondary | ICD-10-CM | POA: Diagnosis present

## 2023-02-15 DIAGNOSIS — O4202 Full-term premature rupture of membranes, onset of labor within 24 hours of rupture: Secondary | ICD-10-CM

## 2023-02-15 DIAGNOSIS — D649 Anemia, unspecified: Secondary | ICD-10-CM

## 2023-02-15 DIAGNOSIS — O99323 Drug use complicating pregnancy, third trimester: Secondary | ICD-10-CM | POA: Diagnosis present

## 2023-02-15 DIAGNOSIS — A562 Chlamydial infection of genitourinary tract, unspecified: Secondary | ICD-10-CM | POA: Diagnosis present

## 2023-02-15 DIAGNOSIS — N838 Other noninflammatory disorders of ovary, fallopian tube and broad ligament: Secondary | ICD-10-CM | POA: Diagnosis present

## 2023-02-15 DIAGNOSIS — O9832 Other infections with a predominantly sexual mode of transmission complicating childbirth: Secondary | ICD-10-CM | POA: Diagnosis present

## 2023-02-15 DIAGNOSIS — Z8751 Personal history of pre-term labor: Secondary | ICD-10-CM

## 2023-02-15 DIAGNOSIS — O99333 Smoking (tobacco) complicating pregnancy, third trimester: Secondary | ICD-10-CM

## 2023-02-15 DIAGNOSIS — O9902 Anemia complicating childbirth: Secondary | ICD-10-CM | POA: Diagnosis present

## 2023-02-15 DIAGNOSIS — Z3A35 35 weeks gestation of pregnancy: Secondary | ICD-10-CM

## 2023-02-15 DIAGNOSIS — O329XX0 Maternal care for malpresentation of fetus, unspecified, not applicable or unspecified: Secondary | ICD-10-CM | POA: Diagnosis present

## 2023-02-15 DIAGNOSIS — F1590 Other stimulant use, unspecified, uncomplicated: Secondary | ICD-10-CM | POA: Diagnosis present

## 2023-02-15 DIAGNOSIS — O99324 Drug use complicating childbirth: Secondary | ICD-10-CM | POA: Diagnosis present

## 2023-02-15 DIAGNOSIS — O9812 Syphilis complicating childbirth: Secondary | ICD-10-CM | POA: Diagnosis present

## 2023-02-15 DIAGNOSIS — A539 Syphilis, unspecified: Secondary | ICD-10-CM

## 2023-02-15 LAB — RUPTURE OF MEMBRANE (ROM)PLUS: Rom Plus: POSITIVE

## 2023-02-15 LAB — CBC
HCT: 28.6 % — ABNORMAL LOW (ref 36.0–46.0)
Hemoglobin: 9.6 g/dL — ABNORMAL LOW (ref 12.0–15.0)
MCH: 30.9 pg (ref 26.0–34.0)
MCHC: 33.6 g/dL (ref 30.0–36.0)
MCV: 92 fL (ref 80.0–100.0)
Platelets: 255 10*3/uL (ref 150–400)
RBC: 3.11 MIL/uL — ABNORMAL LOW (ref 3.87–5.11)
RDW: 14.9 % (ref 11.5–15.5)
WBC: 21.5 10*3/uL — ABNORMAL HIGH (ref 4.0–10.5)
nRBC: 0 % (ref 0.0–0.2)

## 2023-02-15 LAB — TYPE AND SCREEN
ABO/RH(D): AB POS
Antibody Screen: NEGATIVE

## 2023-02-15 LAB — URINE DRUG SCREEN, QUALITATIVE (ARMC ONLY)
Amphetamines, Ur Screen: POSITIVE — AB
Barbiturates, Ur Screen: NOT DETECTED
Benzodiazepine, Ur Scrn: POSITIVE — AB
Cannabinoid 50 Ng, Ur ~~LOC~~: POSITIVE — AB
Cocaine Metabolite,Ur ~~LOC~~: NOT DETECTED
MDMA (Ecstasy)Ur Screen: NOT DETECTED
Methadone Scn, Ur: NOT DETECTED
Opiate, Ur Screen: NOT DETECTED
Phencyclidine (PCP) Ur S: NOT DETECTED
Tricyclic, Ur Screen: POSITIVE — AB

## 2023-02-15 LAB — RPR: RPR Ser Ql: NONREACTIVE

## 2023-02-15 SURGERY — Surgical Case
Anesthesia: General

## 2023-02-15 MED ORDER — LACTATED RINGERS IV SOLN: INTRAVENOUS | Status: DC

## 2023-02-15 MED ORDER — SODIUM CHLORIDE 0.9 % IV SOLN
1.0000 g | Freq: Once | INTRAVENOUS | Status: AC
  Administered 2023-02-15: 1 g via INTRAVENOUS
  Filled 2023-02-15: qty 10

## 2023-02-15 MED ORDER — ONDANSETRON HCL 4 MG/2ML IJ SOLN
INTRAMUSCULAR | Status: AC
  Filled 2023-02-15: qty 2

## 2023-02-15 MED ORDER — HYDROMORPHONE HCL 1 MG/ML IJ SOLN
INTRAMUSCULAR | Status: AC
  Filled 2023-02-15: qty 1

## 2023-02-15 MED ORDER — DEXAMETHASONE SODIUM PHOSPHATE 10 MG/ML IJ SOLN
INTRAMUSCULAR | Status: DC | PRN
  Administered 2023-02-15: 10 mg via INTRAVENOUS

## 2023-02-15 MED ORDER — MIDAZOLAM HCL 2 MG/2ML IJ SOLN
INTRAMUSCULAR | Status: AC
  Filled 2023-02-15: qty 2

## 2023-02-15 MED ORDER — SENNOSIDES-DOCUSATE SODIUM 8.6-50 MG PO TABS
2.0000 | ORAL_TABLET | Freq: Every day | ORAL | Status: DC
  Administered 2023-02-16 – 2023-02-17 (×2): 2 via ORAL
  Filled 2023-02-15 (×2): qty 2

## 2023-02-15 MED ORDER — LACTATED RINGERS IV SOLN: INTRAVENOUS | Status: DC | PRN

## 2023-02-15 MED ORDER — DROPERIDOL 2.5 MG/ML IJ SOLN: 0.6250 mg | Freq: Once | INTRAMUSCULAR | Status: DC | PRN

## 2023-02-15 MED ORDER — DIPHENHYDRAMINE HCL 25 MG PO CAPS: 25.0000 mg | ORAL_CAPSULE | Freq: Four times a day (QID) | ORAL | Status: DC | PRN

## 2023-02-15 MED ORDER — MAGNESIUM HYDROXIDE 400 MG/5ML PO SUSP: 30.0000 mL | ORAL | Status: DC | PRN

## 2023-02-15 MED ORDER — CEFAZOLIN SODIUM-DEXTROSE 2-4 GM/100ML-% IV SOLN
INTRAVENOUS | Status: AC
  Filled 2023-02-15: qty 100

## 2023-02-15 MED ORDER — AZITHROMYCIN 500 MG PO TABS
1000.0000 mg | ORAL_TABLET | Freq: Once | ORAL | Status: AC
  Administered 2023-02-15: 1000 mg via ORAL
  Filled 2023-02-15: qty 2

## 2023-02-15 MED ORDER — CALCIUM CARBONATE ANTACID 500 MG PO CHEW: 2.0000 | CHEWABLE_TABLET | Freq: Three times a day (TID) | ORAL | Status: DC | PRN

## 2023-02-15 MED ORDER — SIMETHICONE 80 MG PO CHEW: 80.0000 mg | CHEWABLE_TABLET | ORAL | Status: DC | PRN

## 2023-02-15 MED ORDER — SOD CITRATE-CITRIC ACID 500-334 MG/5ML PO SOLN: 30.0000 mL | ORAL | Status: DC

## 2023-02-15 MED ORDER — DIBUCAINE (PERIANAL) 1 % EX OINT: 1.0000 | TOPICAL_OINTMENT | CUTANEOUS | Status: DC | PRN

## 2023-02-15 MED ORDER — FENTANYL CITRATE (PF) 100 MCG/2ML IJ SOLN
25.0000 ug | INTRAMUSCULAR | Status: DC | PRN
  Administered 2023-02-15: 25 ug via INTRAVENOUS
  Filled 2023-02-15: qty 2

## 2023-02-15 MED ORDER — FENTANYL CITRATE (PF) 100 MCG/2ML IJ SOLN
INTRAMUSCULAR | Status: AC
  Filled 2023-02-15: qty 2

## 2023-02-15 MED ORDER — PROPOFOL 10 MG/ML IV BOLUS
INTRAVENOUS | Status: DC | PRN
  Administered 2023-02-15: 150 mg via INTRAVENOUS

## 2023-02-15 MED ORDER — DEXMEDETOMIDINE HCL IN NACL 80 MCG/20ML IV SOLN
INTRAVENOUS | Status: DC | PRN
  Administered 2023-02-15: 12 ug via INTRAVENOUS
  Administered 2023-02-15: 8 ug via INTRAVENOUS

## 2023-02-15 MED ORDER — PROPOFOL 10 MG/ML IV BOLUS
INTRAVENOUS | Status: AC
  Filled 2023-02-15: qty 20

## 2023-02-15 MED ORDER — DEXAMETHASONE SODIUM PHOSPHATE 10 MG/ML IJ SOLN
INTRAMUSCULAR | Status: AC
Start: 2023-02-15 — End: ?
  Filled 2023-02-15: qty 1

## 2023-02-15 MED ORDER — HYDROMORPHONE HCL 1 MG/ML IJ SOLN
INTRAMUSCULAR | Status: DC | PRN
  Administered 2023-02-15 (×2): .5 mg via INTRAVENOUS

## 2023-02-15 MED ORDER — LIDOCAINE 5 % EX PTCH
MEDICATED_PATCH | CUTANEOUS | Status: AC
  Filled 2023-02-15: qty 1

## 2023-02-15 MED ORDER — ROCURONIUM BROMIDE 100 MG/10ML IV SOLN: INTRAVENOUS | Status: DC | PRN

## 2023-02-15 MED ORDER — DEXTROSE 5 % IV SOLN
INTRAVENOUS | Status: AC
  Filled 2023-02-15: qty 5

## 2023-02-15 MED ORDER — PENICILLIN G BENZATHINE 1200000 UNIT/2ML IM SUSY
2.4000 10*6.[IU] | PREFILLED_SYRINGE | Freq: Once | INTRAMUSCULAR | Status: AC
  Administered 2023-02-15: 2.4 10*6.[IU] via INTRAMUSCULAR
  Filled 2023-02-15: qty 4

## 2023-02-15 MED ORDER — OXYTOCIN-SODIUM CHLORIDE 30-0.9 UT/500ML-% IV SOLN
INTRAVENOUS | Status: AC
  Administered 2023-02-15: 2.5 [IU]/h via INTRAVENOUS
  Filled 2023-02-15: qty 500

## 2023-02-15 MED ORDER — GABAPENTIN 100 MG PO CAPS
100.0000 mg | ORAL_CAPSULE | Freq: Three times a day (TID) | ORAL | Status: DC
  Administered 2023-02-15 – 2023-02-17 (×6): 100 mg via ORAL
  Filled 2023-02-15 (×6): qty 1

## 2023-02-15 MED ORDER — DEXTROSE 5 % IV SOLN
500.0000 mg | INTRAVENOUS | Status: AC
  Administered 2023-02-15: 500 mg via INTRAVENOUS

## 2023-02-15 MED ORDER — COCONUT OIL OIL: 1.0000 | TOPICAL_OIL | Status: DC | PRN

## 2023-02-15 MED ORDER — FENTANYL CITRATE (PF) 100 MCG/2ML IJ SOLN
INTRAMUSCULAR | Status: DC | PRN
  Administered 2023-02-15 (×2): 50 ug via INTRAVENOUS

## 2023-02-15 MED ORDER — OXYCODONE HCL 5 MG PO TABS
5.0000 mg | ORAL_TABLET | ORAL | Status: DC | PRN
  Administered 2023-02-15: 5 mg via ORAL
  Administered 2023-02-15 (×2): 10 mg via ORAL
  Administered 2023-02-16 (×2): 5 mg via ORAL
  Administered 2023-02-17: 10 mg via ORAL
  Filled 2023-02-15: qty 2
  Filled 2023-02-15: qty 1
  Filled 2023-02-15: qty 2
  Filled 2023-02-15: qty 1
  Filled 2023-02-15: qty 2
  Filled 2023-02-15: qty 1

## 2023-02-15 MED ORDER — CEFAZOLIN SODIUM-DEXTROSE 2-4 GM/100ML-% IV SOLN
2.0000 g | INTRAVENOUS | Status: AC
  Administered 2023-02-15: 2 g via INTRAVENOUS

## 2023-02-15 MED ORDER — OXYTOCIN-SODIUM CHLORIDE 30-0.9 UT/500ML-% IV SOLN
INTRAVENOUS | Status: DC | PRN
  Administered 2023-02-15: 250 mL/h via INTRAVENOUS

## 2023-02-15 MED ORDER — ACETAMINOPHEN 500 MG PO TABS
1000.0000 mg | ORAL_TABLET | Freq: Four times a day (QID) | ORAL | Status: DC
  Administered 2023-02-15 – 2023-02-17 (×8): 1000 mg via ORAL
  Filled 2023-02-15 (×8): qty 2

## 2023-02-15 MED ORDER — LIDOCAINE HCL (CARDIAC) PF 100 MG/5ML IV SOSY
PREFILLED_SYRINGE | INTRAVENOUS | Status: DC | PRN
  Administered 2023-02-15: 80 mg via INTRAVENOUS

## 2023-02-15 MED ORDER — KETOROLAC TROMETHAMINE 30 MG/ML IJ SOLN
30.0000 mg | Freq: Four times a day (QID) | INTRAMUSCULAR | Status: AC
  Administered 2023-02-15 (×4): 30 mg via INTRAVENOUS
  Filled 2023-02-15 (×4): qty 1

## 2023-02-15 MED ORDER — PRENATAL MULTIVITAMIN CH
1.0000 | ORAL_TABLET | Freq: Every day | ORAL | Status: DC
  Administered 2023-02-15 – 2023-02-17 (×3): 1 via ORAL
  Filled 2023-02-15 (×3): qty 1

## 2023-02-15 MED ORDER — IBUPROFEN 800 MG PO TABS
800.0000 mg | ORAL_TABLET | Freq: Three times a day (TID) | ORAL | Status: DC
  Administered 2023-02-16 – 2023-02-17 (×4): 800 mg via ORAL
  Filled 2023-02-15 (×4): qty 1

## 2023-02-15 MED ORDER — SUCCINYLCHOLINE CHLORIDE 200 MG/10ML IV SOSY
PREFILLED_SYRINGE | INTRAVENOUS | Status: DC | PRN
  Administered 2023-02-15: 100 mg via INTRAVENOUS

## 2023-02-15 MED ORDER — OXYTOCIN-SODIUM CHLORIDE 30-0.9 UT/500ML-% IV SOLN
2.5000 [IU]/h | INTRAVENOUS | Status: DC
  Filled 2023-02-15: qty 500

## 2023-02-15 MED ORDER — ONDANSETRON HCL 4 MG/2ML IJ SOLN
INTRAMUSCULAR | Status: DC | PRN
  Administered 2023-02-15: 4 mg via INTRAVENOUS

## 2023-02-15 MED ORDER — PHENYLEPHRINE 80 MCG/ML (10ML) SYRINGE FOR IV PUSH (FOR BLOOD PRESSURE SUPPORT)
PREFILLED_SYRINGE | INTRAVENOUS | Status: DC | PRN
  Administered 2023-02-15 (×3): 160 ug via INTRAVENOUS
  Administered 2023-02-15: 80 ug via INTRAVENOUS
  Administered 2023-02-15: 160 ug via INTRAVENOUS

## 2023-02-15 MED ORDER — WITCH HAZEL-GLYCERIN EX PADS: 1.0000 | MEDICATED_PAD | CUTANEOUS | Status: DC | PRN

## 2023-02-15 MED ORDER — MENTHOL 3 MG MT LOZG: 1.0000 | LOZENGE | OROMUCOSAL | Status: DC | PRN

## 2023-02-15 MED ORDER — SODIUM CHLORIDE 0.9 % IV SOLN: INTRAVENOUS | Status: DC | PRN

## 2023-02-15 MED ORDER — MIDAZOLAM HCL 2 MG/2ML IJ SOLN
INTRAMUSCULAR | Status: DC | PRN
  Administered 2023-02-15 (×2): 1 mg via INTRAVENOUS

## 2023-02-15 SURGICAL SUPPLY — 14 items
CLOSURE STERI STRIP 1/2 X4 (GAUZE/BANDAGES/DRESSINGS) IMPLANT
DRSG TELFA 3X8 NADH STRL (GAUZE/BANDAGES/DRESSINGS) IMPLANT
GAUZE SPONGE 4X4 12PLY STRL (GAUZE/BANDAGES/DRESSINGS) IMPLANT
RETRACTOR WND ALEXIS-O 25 LRG (MISCELLANEOUS) IMPLANT
RTRCTR WOUND ALEXIS O 25CM LRG (MISCELLANEOUS) ×1
SUT CHROMIC 3-0 (SUTURE) ×1
SUT CHROMIC 3-0 54XMFL REEL CR (SUTURE) ×1
SUT MON AB 3-0 SH 27 (SUTURE) IMPLANT
SUT VIC AB 0 CT1 27 (SUTURE) ×1
SUT VIC AB 0 CT1 27XBRD ANTBC (SUTURE) IMPLANT
SUT VIC AB 0 CTX 36 (SUTURE) ×2
SUT VIC AB 0 CTX36XBRD ANBCTRL (SUTURE) IMPLANT
SUT VIC AB 4-0 KS 27 (SUTURE) IMPLANT
SUTURE CHRMC 3-0 54XMFL REL CR (SUTURE) IMPLANT

## 2023-02-15 NOTE — Anesthesia Procedure Notes (Signed)
Procedure Name: Intubation Date/Time: 02/15/2023 4:00 AM  Performed by: Karoline Caldwell, CRNAPre-anesthesia Checklist: Patient identified, Patient being monitored, Timeout performed, Emergency Drugs available and Suction available Patient Re-evaluated:Patient Re-evaluated prior to induction Oxygen Delivery Method: Circle system utilized Preoxygenation: Pre-oxygenation with 100% oxygen Induction Type: IV induction Ventilation: Mask ventilation without difficulty Laryngoscope Size: 3 and McGraph Grade View: Grade I Tube type: Oral Tube size: 7.0 mm Number of attempts: 1 Airway Equipment and Method: Stylet Placement Confirmation: ETT inserted through vocal cords under direct vision, positive ETCO2 and breath sounds checked- equal and bilateral Secured at: 21 cm Tube secured with: Tape Dental Injury: Teeth and Oropharynx as per pre-operative assessment

## 2023-02-15 NOTE — Brief Op Note (Signed)
02/15/2023  4:51 AM  PATIENT:  [name]  27 y.o. female  PRE-OPERATIVE DIAGNOSIS:  Reapeat Cesarean  POST-OPERATIVE DIAGNOSIS:  Reapeat Cesarean  PROCEDURE:  Procedure(s): CESAREAN SECTION  SURGEON:  Surgeons and Role:    * Julieanne Manson, MD - Primary  ASSISTANTS: Tresea Mall, CNM   ANESTHESIA:   general  EBL:  300 mL   BLOOD ADMINISTERED:none  DRAINS: Urinary Catheter (Foley)   LOCAL MEDICATIONS USED:  NONE  SPECIMEN:  Source of Specimen:  Right paratubal cyst  DISPOSITION OF SPECIMEN:  PATHOLOGY  COUNTS:  YES  TOURNIQUET:  * No tourniquets in log *  DICTATION: .Note written in EPIC  PLAN OF CARE:  Postpartum  PATIENT DISPOSITION:   Postpartum   Delay start of Pharmacological VTE agent (>24hrs) due to surgical blood loss or risk of bleeding: yes

## 2023-02-15 NOTE — Anesthesia Preprocedure Evaluation (Signed)
Anesthesia Evaluation  Patient identified by MRN, date of birth, ID band Patient awake    Reviewed: Allergy & Precautions, H&P , NPO status , Patient's Chart, lab work & pertinent test results, reviewed documented beta blocker date and time   History of Anesthesia Complications Negative for: history of anesthetic complications  Airway Mallampati: II  TM Distance: >3 FB Neck ROM: full    Dental  (+) Dental Advidsory Given, Poor Dentition   Pulmonary neg shortness of breath, Continuous Positive Airway Pressure Ventilation , neg COPD, neg recent URI, Current Smoker   Pulmonary exam normal breath sounds clear to auscultation       Cardiovascular Exercise Tolerance: Good negative cardio ROS Normal cardiovascular exam Rhythm:regular Rate:Normal     Neuro/Psych negative neurological ROS  negative psych ROS   GI/Hepatic ,GERD  ,,(+)     substance abuse (acutely intoxicated)  methamphetamine use  Endo/Other  negative endocrine ROS    Renal/GU negative Renal ROS  negative genitourinary   Musculoskeletal   Abdominal   Peds  Hematology  (+) Blood dyscrasia, anemia   Anesthesia Other Findings Past Medical History: No date: Abnormal uterine bleeding 09/17/2016: Cannabis use disorder, moderate, dependence (HCC) 09/17/2016: Deliberate self-cutting 09/18/2016: MDD (major depressive disorder), single episode, moderate  (HCC) No date: Medical history non-contributory 09/17/2016: Tobacco use disorder   Reproductive/Obstetrics (+) Pregnancy                             Anesthesia Physical Anesthesia Plan  ASA: 3  Anesthesia Plan: General   Post-op Pain Management:    Induction: Intravenous, Rapid sequence and Cricoid pressure planned  PONV Risk Score and Plan: 2 and Ondansetron, Dexamethasone and Treatment may vary due to age or medical condition  Airway Management Planned: Oral ETT  Additional  Equipment:   Intra-op Plan:   Post-operative Plan: Extubation in OR  Informed Consent: I have reviewed the patients History and Physical, chart, labs and discussed the procedure including the risks, benefits and alternatives for the proposed anesthesia with the patient or authorized representative who has indicated his/her understanding and acceptance.     Dental Advisory Given  Plan Discussed with: Anesthesiologist, CRNA and Surgeon  Anesthesia Plan Comments:        Anesthesia Quick Evaluation

## 2023-02-15 NOTE — Op Note (Signed)
CESAREAN SECTION OPERATIVE REPORT   DATE OF SURGERY: Feb 15, 2023  SURGEON: Dr. Julieanne Manson ASSISTANT:  Obie Dredge ANESTHESIA: General by Dr. Karlton Lemon  PROCEDURE: Repeat low transverse cesarean section Right paratubal cyst removal  PREOPERATIVE DIAGNOSES: 1. Intrauterine pregnancy at [redacted]w[redacted]d 2. History of previous cesarean section x 1 3. PPROM 3. Active labor 4. Breech presentation 5. Polysubtance use during pregnancy 6. Insufficient prenatal care 7. Inadequately treated syphilis during pregnancy 8. Chlamydia during pregnancy 9. Tobacco use disorder  POSTOPERATIVE DIAGNOSES: 1. Same, s/p rLTCS  QBL:   300cc DRAINS: foley catheter to gravity drainage, 200 ml of clear urine at end of the procedure IV FLUIDS: LR SPECIMENS: Right paratubal cyst COMPLICATIONS:  None  FINDINGS:  Viable female infant in frank breech presentation; APGARs 5/9; weight 2160 grams (4lbs, 12oz) Meconium-stained fluid at amniotomy Intact placenta with 3 vessel cord Uterus, tubes, and ovaries appeared normal  INDICATION and CONSENT: [name] is a 27 y.o. G2P0101 with IUP at [redacted]w[redacted]d presenting with ruptured membranes and painful, regular contractions with a breech fetus. The patient understood that the risks of cesarean section include, but are not limited to, visceral or vascular injury, infection, blood loss and need for transfusion, prolonged hospitalization, and reoperation.  The patient stated understanding and desired to proceed.  All questions were answered.  PROCEDURE:  After verbal and written informed consent was obtained, the patient was taken to the operating room where general anesthesia was found to be adequate.  SCDs were applied to the lower extremities and a Foley catheter was placed in the bladder under sterile technique.  The patient was placed in dorsal supine position with a leftward tilt, prepped and draped in a sterile fashion.  Two grams of Cefazolin and 500mg  of  Azithromycin were given for infection prophylaxis.  Level of anesthesia was confirmed to be adequate with Allis clamps.  A Pfannenstiel skin incision was made with the scalpel and carried down to the underlying layer of rectus fascia.  The fascia was nicked bilaterally in the midline with the scalpel and the fascial incision was extended laterally using Mayo scissors.  The superior aspect of the fascia was grasped with Kocher clamps and the underlying rectus muscles were dissected off sharply with Mayo scissors and bluntly.  In a similar fashion, the inferior aspect of fascia was grasped with Kocher clamps and the underlying rectus and pyramidalis were dissected off sharply and bluntly.  The rectus muscles were separated in the midline bluntly.  The peritoneum was found to be free of adherent bowel and entered bluntly.  The peritoneal incision was extended bluntly to the bladder reflection with good visualization of the bladder.  The Alexis retractor was inserted and vesicouterine peritoneum identified.  Intraabdomnial survey revealed scant, clear peritoneal fluid and a thinned-out lower uterine segment.  The lower uterine segment was incised transversely with the scalpel.  The amniotic sac was ruptured with the hysterotomy and meconium-stained fluid noted.  The uterine incision was extended bluntly in a cranial-caudal fashion.  The fetus was in breech presentation. The fetal left arm spontaneously protruded from the hysterotomy. The left leg was grasped and brought through the incision, followed by the sacrum, which was wrapped in a blue towel. With gentle traction, the right leg and the rest of the body delivered. Gentle fundal pressure was applied by the assistant and the infant's head was delivered without difficulty.  The nose and mouth were suctioned with a bulb. The cord was doubly clamped and cut.  The infant was handed to the awaiting NICU team.  The placenta delivered intact & spontaneously with manual  massage of the uterine fundus. The uterus was exteriorized.  The inside of the uterus was gently wiped with lap sponges x 2 ensuring complete removal of placental membranes.  The uterine incision was repaired with a double layer closure of 0-Vicryl first in a locking fashion, followed by 0-Vicryl in an imbricating stitch, with excellent hemostasis achieved.  The ovaries and tubes were found to be grossly normal. A right paratubal cyst was noted, loosely attached and was removed with Metzenbaum scissors and sent to pathology.  The uterus, tubes, and ovaries were then returned to the abdominal cavity in the anatomical position.  The posterior cul-de-sac was cleared of clots and debris. Blood clots, debris and fluid were cleaned from the abdomen, gutters, and pelvis with moist laparotomy sponges.  The uterine incision was reinspected and was hemostatic.   The superior and inferior fascia were grasped with Kocher clamps and the rectus muscles were examined and found to be hemostatic, ensured with Bovie electrocautery.  The peritoneum was closed with 3-0 Monocryl in a continuous, running fashion, and the rectus muscles reapproximated well without suture. The fascial layer was closed with 0-Vicryl in a running fashion.  The subcutaneous tissue was irrigated, made hemostatic with Bovie electrocautery, then reapproximated with a running layer of 3-0 Plain. The skin was closed subcuticularly with 4-0 Vicryl on a keith and steristrips and a sterile pressure dressing  All sponge, lap and instrument counts were correct x 2. The patient tolerated the procedure well and was taken to the recovery room in stable condition.  An experienced assistant was required given the standard of surgical care given the complexity of the case.  This assistant was needed for exposure, dissection, suctioning, retraction, instrument exchange, and for overall help during the procedure.   Julieanne Manson, DO [facility]
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
## 2023-02-15 NOTE — Progress Notes (Signed)
Notified by CNM of pt's admission of 27yo G2P0101 at [redacted]w[redacted]d by 24wk [place], prior x 1 for PPROM/breech, now in active labor, complete, and breech with PPROM at approx midnight with subjective meconium, contractions started shortly after. Pregnancy c/b active methamphetamine use, tobacco use, chlamydia (tx completed 01/26/23) and syphilis during pregnancy, inadequately treated (PCN #1 02/02/23), and insufficient prenatal care, 2 visits with [facility] on 12/11/22, then 01/15/23. At time of encounter, pt was actively trying to push. Consented for repeat cesarean.  -Admit orders signed, UDS pending -Ancef 2g/Azithromycin 500mg  -Anesthesia notified -Move to OR when able  [facility] at [facility]

## 2023-02-15 NOTE — Transfer of Care (Signed)
Immediate Anesthesia Transfer of Care Note  Patient: [name]  Procedure(s) Performed: CESAREAN SECTION  Patient Location: Mother/Baby  Anesthesia Type:General  Level of Consciousness: drowsy  Airway & Oxygen Therapy: Patient Spontanous Breathing and Patient connected to nasal cannula oxygen  Post-op Assessment: Report given to RN  Post vital signs: stable  Last Vitals:  Vitals Value Taken Time  BP    Temp    Pulse    Resp    SpO2      Last Pain:  Vitals:   02/15/23 0247  TempSrc:   PainSc: 8          Complications: No notable events documented.

## 2023-02-15 NOTE — Significant Event (Signed)
Notified by RN that [name] reported she has not taken Azithromycin which was dispensed by [facility] 02/02/23 to treat chlamydia infection. She also requires her third dose of Bicillin for syphilis treatment. On chart review E. Coli UTI noted based on culture at [facility] 11/2. Discussed with pharmacist, will provide single dose of IV ceftriaxone and consider fosfomycin dose vs cephalexin at discharge.

## 2023-02-15 NOTE — OB Triage Note (Signed)
[facility]. Is [redacted]w[redacted]d is G2P1 seen at [facility]. Was last seen end of October. Patient today c/o CTX and LOF. Patient is repeat c/s and desires repeat. Patient thinks water broke around 12 am, and last had food and drink about 3 hours ago. Patient reports pain 8/10 with CTX. C/o of headache as well. BP WDL. Patient recently treated for syphilis and chlamydia. Patient noted to have green stained fluid on legs. ROM plus and GBS swab collected. When collected patient noted to be sensitive to touch. Patient last used Meth today. Monitors applied FHR 145. Provider to be notified.

## 2023-02-15 NOTE — Anesthesia Postprocedure Evaluation (Signed)
Anesthesia Post Note  Patient: [name]  Procedure(s) Performed: CESAREAN SECTION  Patient location during evaluation: Mother Baby Anesthesia Type: General Level of consciousness: oriented and awake and alert Pain management: pain level controlled Vital Signs Assessment: post-procedure vital signs reviewed and stable Respiratory status: spontaneous breathing and respiratory function stable Cardiovascular status: blood pressure returned to baseline and stable Postop Assessment: no headache, no backache, no apparent nausea or vomiting and able to ambulate Anesthetic complications: no  No notable events documented.   Last Vitals:  Vitals:   02/15/23 0730 02/15/23 0735  BP:  129/77  Pulse: (!) 102 (!) 101  Resp:    Temp:    SpO2: 100% 99%    Last Pain:  Vitals:   02/15/23 0730  TempSrc:   PainSc: 9                  Stormy Fabian A

## 2023-02-16 LAB — SURGICAL PATHOLOGY

## 2023-02-16 LAB — CBC
HCT: 23.6 % — ABNORMAL LOW (ref 36.0–46.0)
Hemoglobin: 7.8 g/dL — ABNORMAL LOW (ref 12.0–15.0)
MCH: 30.1 pg (ref 26.0–34.0)
MCHC: 33.1 g/dL (ref 30.0–36.0)
MCV: 91.1 fL (ref 80.0–100.0)
Platelets: 256 10*3/uL (ref 150–400)
RBC: 2.59 MIL/uL — ABNORMAL LOW (ref 3.87–5.11)
RDW: 15.1 % (ref 11.5–15.5)
WBC: 22.2 10*3/uL — ABNORMAL HIGH (ref 4.0–10.5)
nRBC: 0 % (ref 0.0–0.2)

## 2023-02-16 MED ORDER — FOSFOMYCIN TROMETHAMINE 3 G PO PACK
3.0000 g | PACK | Freq: Once | ORAL | Status: AC
  Administered 2023-02-16: 3 g via ORAL
  Filled 2023-02-16: qty 3

## 2023-02-16 MED ORDER — IRON SUCROSE 300 MG IVPB - SIMPLE MED
300.0000 mg | Freq: Once | Status: AC
Start: 2023-02-16 — End: 2023-02-16
  Administered 2023-02-16: 300 mg via INTRAVENOUS
  Filled 2023-02-16: qty 265

## 2023-02-16 MED ORDER — SODIUM CHLORIDE 0.9 % IV SOLN: INTRAVENOUS | Status: AC | PRN

## 2023-02-16 NOTE — Progress Notes (Signed)
Postpartum Day # 1: Cesarean Delivery for breech presentation at 35.5 weeks with PPROM. Patient with limited PNC, anemia of pregnancy, iron deficiency anemia, Chlamydia and syphilis infection this pregnancy, substance use disorder. Also with untreated UTI noted at outside facility [facility]) 2 days prior to delivery.   Subjective: Patient reports tolerating PO and no problems voiding.  Pain controlled with current pain medications. Has not yet passed flatus. Ambulating without difficulty. Has visited infant in the NICU.   Objective: Vital signs in last 24 hours: Temp:  [97.9 F (36.6 C)-98.5 F (36.9 C)] 97.9 F (36.6 C) (11/05 0733) Pulse Rate:  [90-117] 90 (11/05 0733) Resp:  [18-20] 18 (11/05 0733) BP: (104-134)/(60-86) 104/66 (11/05 0733) SpO2:  [95 %-100 %] 100 % (11/05 0733)  Physical Exam:  General: alert and no distress Lungs: clear to auscultation bilaterally Breasts: normal appearance, no masses or tenderness Heart: regular rate and rhythm, S1, S2 normal, no murmur, click, rub or gallop Abdomen: soft, non-tender; bowel sounds normal; no masses,  no organomegaly Pelvis: Lochia appropriate, Uterine Fundus firm, Incision: bandage clean/dry/intact.  Extremities: DVT Evaluation: No evidence of DVT seen on physical exam. Negative Homan's sign. No cords or calf tenderness. No significant calf/ankle edema.  Recent Labs    02/15/23 0317 02/16/23 0405  HGB 9.6* 7.8*  HCT 28.6* 23.6*    Assessment/Plan:  Post-operative state:  Status post Cesarean section. Doing well postoperatively.  Social Work consult for limited [facility] and substance abuse this pregnancy Contraception undecided at this time. Advance diet as tolerated Continue PO pain management Plans to formula feed.  History of iron deficiency anemia of pregnancy Will plan for iron infusion. Patient is otherwise asymptomatic at this time.  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  Chlamydia in pregnancy Treated with Azithromycin this admission.   Continue current care.  Plan for d/c home in 1-2 days.     Hildred Laser, MD [facility] at [facility]

## 2023-02-16 NOTE — Clinical Social Work Maternal (Addendum)
CLINICAL SOCIAL WORK MATERNAL/CHILD NOTE  Patient Details  Name: [name] MRN: [id number] Date of Birth: 01-26-1996  Date:  02/16/2023  Clinical Social Worker Initiating Note:  Bevelyn Ngo RNCM Date/Time: Initiated:  02/16/23/      Child's Name:  [facility]   Biological Parents:  Mother   Need for Interpreter:  None   Reason for Referral:  Current Substance Use/Substance Use During Pregnancy  , Late or No Prenatal Care     Address:  [address] [place] [address]    Phone number:  [phone] (home)     Additional phone number:   Household Members/Support Persons (HM/SP):   Household Member/Support Person 1   HM/SP Name Relationship DOB or Age  HM/SP -1 Lonia Skinner Father of baby's Mother    HM/SP -2        HM/SP -3        HM/SP -4        HM/SP -5        HM/SP -6        HM/SP -7        HM/SP -8          Natural Supports (not living in the home):  Spouse/significant other   Professional Supports:     Employment: Unemployed   Type of Work:     Education:      Homebound arranged:    Architect:      Other Resources:        Strengths:  Ability to meet basic needs  , Pediatrician chosen   Psychotropic Medications:         Pediatrician:    [facility]  Pediatrician List:   [facility] Other ([facility])  [facility]      Pediatrician Fax Number:    Risk Factors/Current Problems:  DHHS Involvement  , Substance Use     Cognitive State:  Alert     Mood/Affect:  Calm     CSW Assessment: Spoke with patient via phone.  Baby is currently being cared for in special care nursery.  Patient states that baby's father Shelly Flatten intends take part in caring for baby at discharge, but they will not be staying in the same home.   Patient states that she will be staying at [facility] [place]) [address] [place] [address] Patient  states that her home is a stable environment where she know she is not able to use drugs in order to live there, and she feels like this will help her to be successful.  Patient states that she has a 84 year old daughter named [name] that lives with patient's Mother [name].  She states that [name] has temporary custody, however [name] has lived with her for 4 years.    Patient expresses desire for substance abuse resources  Noted to have Edinburgh score of 18.  Patient denies SI/HI  Substance abuse resources, PPD and baby blues resources added to AVS  Per Dr Alice Rieger Baby's urine sample was collected but not received in lab.  Unable to reorder at this time  Cord Blood pending  CPS report made to [facility] with [facility].  Assessment is not scheduled that this time.  She request for results to be called from cord blood   Patient states that [facility] has ordered a crib, and that a friend will  be purchasing a diaper bag and car seat   CSW Plan/Description:  Psychosocial Support and Ongoing Assessment of Needs, Perinatal Mood and Anxiety Disorder (PMADs) Education, Child Protective Service Report  , CSW Awaiting CPS Disposition Plan, CSW Will Continue to Monitor Umbilical Cord Tissue Drug Screen Results and Make Report if Tobey Bride, RN 02/16/2023, 4:28 PM

## 2023-02-16 NOTE — Discharge Instructions (Signed)
Intensive Outpatient Programs   [facility] [address] [place],  [place], [place] [phone]  [facility] (Inpatient and [phone] (Suboxone and Methadone) [address] [phone]  [facility]: Alcohol & Drug [facility] Programs - Intensive Outpatient [address] [place], [place] [address]  [phone]  [facility] (Outpatient, Inpatient, [facility] (Groups and Residental) (insurance only) [phone] [place], [place] [phone]   [facility] (for caregivers and family) [address] Laurell Josephs [address] [place], [place] [phone]  Residential Treatment Programs  [facility](2 years) Residential: 44 days)[facility] [address] [facility] [address] [place], [place], [place] [phone] or [phone]  [facility] [address] [place], [place], [place] [phone]  [facility] ([facility]) [address] [place], [place], [place] [phone] Admissions: 8am-3pm M-F  [facility] Program: Residential Program [phone] Days)             [facility]: [facility]  [place], [place], [place]  [phone] or [phone] in Hours over the weekend or by referral)  [facility] [place] [address] [place], [place] [address] [phone] (Do virtual or phone assessment, offer transportation within 25 miles, have in patient and Outpatient options)   [facility] Crisis: [facility]:[phone] (for crisis  response 24 hours a day)

## 2023-02-16 NOTE — Plan of Care (Signed)
  Problem: Education: Goal: Knowledge of General Education information will improve Description: Including pain rating scale, medication(s)/side effects and non-pharmacologic comfort measures Outcome: Progressing   Problem: Health Behavior/Discharge Planning: Goal: Ability to manage health-related needs will improve Outcome: Progressing   Problem: Clinical Measurements: Goal: Ability to maintain clinical measurements within normal limits will improve Outcome: Progressing Goal: Will remain free from infection Outcome: Progressing Goal: Diagnostic test results will improve Outcome: Progressing Goal: Respiratory complications will improve Outcome: Progressing Goal: Cardiovascular complication will be avoided Outcome: Progressing   Problem: Activity: Goal: Risk for activity intolerance will decrease Outcome: Progressing   Problem: Nutrition: Goal: Adequate nutrition will be maintained Outcome: Progressing   Problem: Coping: Goal: Level of anxiety will decrease Outcome: Progressing   Problem: Elimination: Goal: Will not experience complications related to bowel motility Outcome: Progressing Goal: Will not experience complications related to urinary retention Outcome: Progressing   Problem: Pain Management: Goal: General experience of comfort will improve Outcome: Progressing   Problem: Safety: Goal: Ability to remain free from injury will improve Outcome: Progressing   Problem: Skin Integrity: Goal: Risk for impaired skin integrity will decrease Outcome: Progressing   Problem: Education: Goal: Knowledge of the prescribed therapeutic regimen will improve Outcome: Progressing Goal: Understanding of sexual limitations or changes related to disease process or condition will improve Outcome: Progressing Goal: Individualized Educational Video(s) Outcome: Progressing   Problem: Self-Concept: Goal: Communication of feelings regarding changes in body function or  appearance will improve Outcome: Progressing   Problem: Skin Integrity: Goal: Demonstration of wound healing without infection will improve Outcome: Progressing   Problem: Education: Goal: Knowledge of condition will improve Outcome: Progressing Goal: Individualized Educational Video(s) Outcome: Progressing Goal: Individualized Newborn Educational Video(s) Outcome: Progressing   Problem: Activity: Goal: Will verbalize the importance of balancing activity with adequate rest periods Outcome: Progressing Goal: Ability to tolerate increased activity will improve Outcome: Progressing   Problem: Coping: Goal: Ability to identify and utilize available resources and services will improve Outcome: Progressing   Problem: Life Cycle: Goal: Chance of risk for complications during the postpartum period will decrease Outcome: Progressing   Problem: Role Relationship: Goal: Ability to demonstrate positive interaction with newborn will improve Outcome: Progressing   Problem: Skin Integrity: Goal: Demonstration of wound healing without infection will improve Outcome: Progressing

## 2023-02-16 NOTE — Discharge Summary (Shared)
Postpartum Discharge Summary  Date of Service updated 02/17/2023     Patient Name: [name] DOB: 05/05/1995 MRN: [id number]  Date of admission: 02/15/2023 Delivery date:02/15/2023 Delivering provider: Julieanne Manson Date of discharge: 02/17/2023  Admitting diagnosis: Labor and delivery, indication for care [O75.9] Malpresentation of fetus [O32.9XX0] Intrauterine pregnancy: [redacted]w[redacted]d     Secondary diagnosis:  Principal Problem:   Cesarean delivery delivered Active Problems:   Tobacco use disorder   Preterm premature rupture of membranes (PPROM) with onset of labor within 24 hours of rupture in third trimester, antepartum   Previous cesarean delivery affecting pregnancy   History of preterm delivery   Drug use affecting pregnancy in third trimester   Anemia affecting pregnancy (iron deficiency)   Malpresentation of fetus   Active preterm labor   Single live birth   Pregnancy complicated by tobacco use in third trimester   Insufficient antepartum care  Additional problems: UTI in third trimester    Discharge diagnosis: Preterm Pregnancy Delivered and Anemia                                              Post partum procedures: iron infusion Augmentation: N/A Complications: None  Hospital course: Onset of Labor With Unplanned C/S   27 y.o. yo G2P0202 at [redacted]w[redacted]d was admitted in Active Labor on 02/15/2023. Patient had a labor course significant for none, presented to triage with completely dilated cervix with breech presentation. The patient went for cesarean section due to Malpresentation. Delivery details as follows: Membrane Rupture Time/Date: 12:00 AM,02/15/2023  Delivery Method:C-Section, Low Transverse Operative Delivery:N/A Details of operation can be found in separate operative note. Patient had a postpartum course that was uncomplicated.  She is ambulating,tolerating a regular diet, passing flatus, and urinating well. She received her second dose of PCN for syphilis in  pregnancy, also treated this admission with Azithromycin for Chlamydia infection (dx 10/15, reported tx but was still positive on admission), and with IV Rocephin 250 mg x 1 dose and oral Fosfomycin for recent UTI diagnosed 2 days ago Patient is discharged home in stable condition 02/17/23.  Newborn Data: Birth date:02/15/2023 Birth time:4:04 AM Gender:Female Living status:Living Apgars:5 ,9  Weight:2160 g  Magnesium Sulfate received: No BMZ received: No Rhophylac:No SAY:TKZSWF T-DaP:Given prenatally Flu: No, declined RSV Vaccine received: No Transfusion:No Immunizations administered: Immunization History  Administered Date(s) Administered   Tdap 06/29/2017, 08/22/2017, 01/15/2023    Physical exam  Vitals:   02/16/23 1448 02/16/23 1757 02/16/23 2225 02/17/23 0740  BP: (!) 130/91 113/71 134/82 102/66  Pulse: (!) 101 94 (!) 131 96  Resp: 18 16 18 18   Temp: 98.2 F (36.8 C)  97.9 F (36.6 C) 98.3 F (36.8 C)  TempSrc: Axillary  Axillary Oral  SpO2: 100%  96% 98%  Weight:      Height:       General: alert, cooperative, and no distress Lochia: appropriate Uterine Fundus: firm Incision: Healing well with no significant drainage, No significant erythema, Dressing is clean, dry, and intact DVT Evaluation: No evidence of DVT seen on physical exam. Labs: Lab Results  Component Value Date   WBC 22.2 (H) 02/16/2023   HGB 7.8 (L) 02/16/2023   HCT 23.6 (L) 02/16/2023   MCV 91.1 02/16/2023   PLT 256 02/16/2023     Edinburgh Score:    02/16/2023   11:19 AM  Edinburgh Postnatal Depression Scale Screening Tool  I have been able to laugh and see the funny side of things. 2  I have looked forward with enjoyment to things. 1  I have blamed myself unnecessarily when things went wrong. 3  I have been anxious or worried for no good reason. 2  I have felt scared or panicky for no good reason. 1  Things have been getting on top of me. 2  I have been so unhappy that I have had  difficulty sleeping. 2  I have felt sad or miserable. 3  I have been so unhappy that I have been crying. 1  The thought of harming myself has occurred to me. 1  Edinburgh Postnatal Depression Scale Total 18      After visit meds:  Allergies as of 02/17/2023   No Known Allergies      Medication List     STOP taking these medications    multivitamin with minerals tablet       TAKE these medications    acetaminophen 500 MG tablet Commonly known as: TYLENOL Take 2 tablets (1,000 mg total) by mouth every 6 (six) hours.   etonogestrel 68 MG Impl implant Commonly known as: NEXPLANON 1 each (68 mg total) by Subdermal route.   Fusion Plus Caps Take 1 capsule by mouth daily at 6 (six) AM.   ibuprofen 800 MG tablet Commonly known as: ADVIL Take 1 tablet (800 mg total) by mouth every 8 (eight) hours.   ondansetron 4 MG disintegrating tablet Commonly known as: ZOFRAN-ODT Take 1 tablet (4 mg total) by mouth every 8 (eight) hours as needed for nausea or vomiting.   Prenatal Vitamins 0.8 MG tablet Take 1 tablet by mouth daily.         Discharge home in stable condition Infant Feeding: Bottle Infant Disposition:NICU Discharge instruction: per After Visit Summary and Postpartum booklet. Activity: Advance as tolerated. Pelvic rest for 6 weeks.  Diet: routine diet Anticipated Birth Control: PP Nexplanon placed Postpartum Appointment:6 weeks Additional Postpartum F/U: Postpartum Depression checkup and Incision check 1 week Future Appointments: Future Appointments  Date Time Provider Department Center  02/23/2023 10:55 AM Julieanne Manson, MD [facility] None   Follow up Visit:      02/17/2023 Lindalou Hose Free, CNM

## 2023-02-17 ENCOUNTER — Encounter: Payer: Self-pay | Admitting: Obstetrics and Gynecology

## 2023-02-17 DIAGNOSIS — Z3046 Encounter for surveillance of implantable subdermal contraceptive: Secondary | ICD-10-CM

## 2023-02-17 MED ORDER — ONDANSETRON 4 MG PO TBDP: 4.0000 mg | ORAL_TABLET | Freq: Three times a day (TID) | ORAL | 1 refills | Status: DC | PRN

## 2023-02-17 MED ORDER — ONDANSETRON 4 MG PO TBDP
4.0000 mg | ORAL_TABLET | Freq: Three times a day (TID) | ORAL | Status: DC | PRN
  Administered 2023-02-17: 4 mg via ORAL
  Filled 2023-02-17: qty 1

## 2023-02-17 MED ORDER — ETONOGESTREL 68 MG ~~LOC~~ IMPL
68.0000 mg | DRUG_IMPLANT | Freq: Once | SUBCUTANEOUS | Status: DC
  Filled 2023-02-17: qty 1

## 2023-02-17 MED ORDER — IBUPROFEN 800 MG PO TABS: 800.0000 mg | ORAL_TABLET | Freq: Three times a day (TID) | ORAL | Status: AC

## 2023-02-17 MED ORDER — ACETAMINOPHEN 500 MG PO TABS: 1000.0000 mg | ORAL_TABLET | Freq: Four times a day (QID) | ORAL | Status: AC

## 2023-02-17 MED ORDER — LIDOCAINE HCL 1 % IJ SOLN
0.0000 mL | Freq: Once | INTRAMUSCULAR | Status: DC | PRN
  Filled 2023-02-17: qty 10

## 2023-02-17 NOTE — Progress Notes (Signed)
Patient d/c home  D/c instructions, Rx, and f/u appt given to and reviewed with pt by previous shift. Pt verbalized understanding. Escorted out by staff.

## 2023-02-17 NOTE — Procedures (Signed)
PROCEDURE NOTE FOR NEXPLANON INSERTION  SUBJECTIVE [name] is a 27 y.o. [phone] who presents today for insertion of Nexplanon contraceptive device. She desires reversible long-term contraception. We have thoroughly reviewed the risks, benefits, and alternatives, and she has elected to proceed with Nexplanon insertion.   OBJECTIVE BP 121/78 (BP Location: Right Arm)   Pulse 99   Temp 98.5 F (36.9 C) (Oral)   Resp 19   Ht 5\' 4"  (1.626 m)   Wt 59 kg   LMP  (LMP Unknown)   SpO2 98%   Breastfeeding Unknown   BMI 22.31 kg/m   Procedure Note Left Arm Sterile Preparation:  Betadine Insertion site was selected (8-10) cm from the medial epicondyle and marked using a sterile marker. The procedure area was prepped in sterile fashion. Adequate anesthesia was achieved with 3 mL of 1% lidocaine injected subcutaneously. The Nexplanon applicator was inserted subcutaneously and the Nexplanon device was delivered. The applicator was removed from the insertion site. The capsule was palpated by myself and the patient to confirm satisfactory placement. Blood loss was minimal. A pressure dressing was applied.  The patient tolerated the procedure well with no complications. Standard post-procedure care and return precautions were explained.   Lindalou Hose Mikiah Demond, CNM

## 2023-02-18 LAB — CULTURE, BETA STREP (GROUP B ONLY)

## 2023-02-23 ENCOUNTER — Ambulatory Visit: Payer: Self-pay | Admitting: Obstetrics

## 2023-02-23 ENCOUNTER — Telehealth: Payer: Self-pay | Admitting: Obstetrics

## 2023-02-23 NOTE — Telephone Encounter (Signed)
Reached out to pt to reschedule 1 week incision check/mood check appt that was scheduled on 02/23/23 at 10:55 with Dr. Lonny Prude.

## 2023-02-24 ENCOUNTER — Encounter: Payer: Self-pay | Admitting: Licensed Practical Nurse

## 2023-02-24 NOTE — Telephone Encounter (Signed)
Reached out to pt (2x) to reschedule 1 week incision check/mood check appt that was scheduled on 02/23/2023 at 10:55 with Dr. Lonny Prude.  Will send a MyChart letter to pt.

## 2023-08-11 ENCOUNTER — Ambulatory Visit: Payer: Self-pay

## 2023-08-19 ENCOUNTER — Ambulatory Visit: Payer: Self-pay | Admitting: Family Medicine

## 2023-08-19 ENCOUNTER — Encounter: Payer: Self-pay | Admitting: Family Medicine

## 2023-08-19 DIAGNOSIS — N760 Acute vaginitis: Secondary | ICD-10-CM

## 2023-08-19 DIAGNOSIS — Z202 Contact with and (suspected) exposure to infections with a predominantly sexual mode of transmission: Secondary | ICD-10-CM

## 2023-08-19 DIAGNOSIS — Z113 Encounter for screening for infections with a predominantly sexual mode of transmission: Secondary | ICD-10-CM

## 2023-08-19 LAB — HM HEPATITIS C SCREENING LAB: HM Hepatitis Screen: NEGATIVE

## 2023-08-19 LAB — WET PREP FOR TRICH, YEAST, CLUE
Clue Cell Exam: POSITIVE — AB
Trichomonas Exam: NEGATIVE
Yeast Exam: NEGATIVE

## 2023-08-19 LAB — HM HIV SCREENING LAB: HM HIV Screening: NEGATIVE

## 2023-08-19 MED ORDER — METRONIDAZOLE 500 MG PO TABS: 500.0000 mg | ORAL_TABLET | Freq: Two times a day (BID) | ORAL | Status: AC

## 2023-08-19 MED ORDER — DOXYCYCLINE HYCLATE 100 MG PO TABS: 100.0000 mg | ORAL_TABLET | Freq: Two times a day (BID) | ORAL | Status: AC

## 2023-08-19 MED ORDER — PENICILLIN G BENZATHINE 1200000 UNIT/2ML IM SUSY
2.4000 10*6.[IU] | PREFILLED_SYRINGE | Freq: Once | INTRAMUSCULAR | Status: AC
  Administered 2023-08-19: 2.4 10*6.[IU] via INTRAMUSCULAR

## 2023-08-19 NOTE — Progress Notes (Signed)
 Pt is here for STD visit today.Wet prep reviewed with patient. Pt has been given Bicillin  injection 2.4MU at LUOQ+RUOQ and pt tolerated well to injection. The patient was dispensed Metronidazole  500 mg 2x/day for 7 days and Doxycycline 100mg  2x/day for 7 days today. I provided counseling today regarding the medication. We discussed the medication, the side effects and when to call clinic. Condoms and brochure given. Patient given the opportunity to ask questions for any clarifications. Austine Lefort, RN.

## 2023-08-19 NOTE — Progress Notes (Signed)
 [facility] Department STI clinic [address], [address] [place] [address] Main phone: [phone]  STI screening visit  Subjective:  [name] is a 28 y.o. female being seen today for an STI screening visit. The patient reports they do not have symptoms.  Patient reports that they do not desire a pregnancy in the next year.   They reported they are not interested in discussing contraception today.    No LMP recorded. Patient has had an implant.  Patient has the following medical conditions:  Patient Active Problem List   Diagnosis Date Noted   History of cesarean delivery 02/15/2023   Drug use affecting pregnancy in third trimester 01/15/2023   History of preterm delivery 12/24/2022   MDD (major depressive disorder), single episode, moderate (HCC) 09/18/2016   Deliberate self-cutting 09/17/2016   Tobacco use disorder 09/17/2016   Cannabis use disorder, moderate, dependence (HCC) 09/17/2016   Chief Complaint  Patient presents with   SEXUALLY TRANSMITTED DISEASE    HPI Patient reports  to clinic asking to be re-treated for syphilis and chlamydia. Reports she was treated last fall during pregnancy (01/2023 per chart review). States that her partner never got treated. She reports having contact with him again. Wants to be re-treated. Reviewed with DIS- can given 1x bic.   Does the patient using douching products? No  Last HIV test per patient/review of record was  Lab Results  Component Value Date   HMHIVSCREEN Negative - Validated 02/02/2023    Lab Results  Component Value Date   HIV Non Reactive 01/15/2023     Last HEPC test per patient/review of record was  Lab Results  Component Value Date   HMHEPCSCREEN Negative-Validated 02/02/2023   No components found for: "HEPC"   Last HEPB test per patient/review of record was No components found for: "HMHEPBSCREEN"   Patient reports last pap was:      Component Value Date/Time    DIAGPAP  01/15/2023 1426    - Negative for Intraepithelial Lesions or Malignancy (NILM)   DIAGPAP - Benign reactive/reparative changes 01/15/2023 1426   ADEQPAP  01/15/2023 1426    Satisfactory for evaluation; transformation zone component PRESENT.   Lab Results  Component Value Date   SPECADGYN Comment 03/01/2017   Result Date Procedure Results Follow-ups  01/15/2023 Cytology - PAP Neisseria Gonorrhea: Negative Chlamydia: Positive (A) Adequacy: Satisfactory for evaluation; transformation zone component PRESENT. Diagnosis: - Negative for Intraepithelial Lesions or Malignancy (NILM) Diagnosis: - Benign reactive/reparative changes Comment: Normal Reference Ranger Chlamydia - Negative Comment: Normal Reference Range Neisseria Gonorrhea - Negative   03/01/2017 Pap IG, CT/NG w/ reflex HPV when ASC-U DIAGNOSIS:: Comment Gonococcus by Nucleic Acid Amp: Negative Specimen adequacy:: Comment Clinician Provided ICD10: Comment Performed by:: Comment PAP Smear Comment: . Note:: Comment Test Methodology: Comment PAP Reflex: Comment Chlamydia, Nuc. Acid Amp: Negative     Screening for MPX risk: Does the patient have an unexplained rash? No Is the patient MSM? No Does the patient endorse multiple sex partners or anonymous sex partners? Yes Did the patient have close or sexual contact with a person diagnosed with MPX? No Has the patient traveled outside the [place]  where MPX is endemic? No Is there a high clinical suspicion for MPX-- evidenced by one of the following No  -Unlikely to be chickenpox  -Lymphadenopathy  -Rash that present in same phase of evolution on any given body part  See flowsheet for further details and programmatic requirements Hyperlink available at the top  of the signed note in blue.  Flow sheet content below:  Pregnancy Intention Screening Does the patient want to become pregnant in the next year?: No Does the patient's partner want to become pregnant in the next year?:  No Would the patient like to discuss contraceptive options today?: No Counseling Patient counseled to use condoms with all sex: Condoms declined RTC in 2-3 weeks for test results: Yes Clinic will call if test results abnormal before test result appt.: Yes Test results given to patient Patient counseled to use condoms with all sex: Condoms declined   Immunization history:  Immunization History  Administered Date(s) Administered   Tdap 06/29/2017, 08/22/2017, 01/15/2023    The following portions of the patient's history were reviewed and updated as appropriate: allergies, current medications, past medical history, past social history, past surgical history and problem list.  Objective:  There were no vitals filed for this visit.  Physical Exam Vitals and nursing note reviewed. Exam conducted with a chaperone present Susanna Epley CMA).  Constitutional:      Appearance: Normal appearance.  HENT:     Head: Normocephalic and atraumatic.     Mouth/Throat:     Mouth: Mucous membranes are moist.     Pharynx: Oropharynx is clear. No oropharyngeal exudate or posterior oropharyngeal erythema.  Pulmonary:     Effort: Pulmonary effort is normal.  Abdominal:     General: Abdomen is flat.     Palpations: There is no mass.     Tenderness: There is no abdominal tenderness. There is no rebound.  Genitourinary:    Comments: Initially desired exam but patient visibly uncomfortable and wanted to stop- self swabbed Lymphadenopathy:     Head:     Right side of head: No preauricular or posterior auricular adenopathy.     Left side of head: No preauricular or posterior auricular adenopathy.     Cervical: No cervical adenopathy.     Upper Body:     Right upper body: No supraclavicular, axillary or epitrochlear adenopathy.     Left upper body: No supraclavicular, axillary or epitrochlear adenopathy.  Skin:    General: Skin is warm and dry.     Findings: No rash.  Neurological:     Mental Status:  She is alert and oriented to person, place, and time.     Assessment and Plan:  [name] is a 28 y.o. female presenting to the [facility] Department for STI screening  1. Exposure to syphilis (Primary)  - penicillin  g benzathine (BICILLIN  LA) 1200000 UNIT/2ML injection 2.4 Million Units  2. Exposure to chlamydia  - doxycycline (VIBRA-TABS) 100 MG tablet; Take 1 tablet (100 mg total) by mouth 2 (two) times daily for 7 days.  3. Screening for venereal disease  - Chlamydia/Gonorrhea [facility] - HIV/HCV [place] [facility] - HBV Antigen/Antibody [facility] - Syphilis Serology,  Lab - WET PREP FOR TRICH, YEAST, CLUE    Patient accepted all screenings including vaginal CT/GC and bloodwork for HIV/RPR, and wet prep. Patient meets criteria for HepB screening? Yes. Ordered? yes Patient meets criteria for HepC screening? Yes. Ordered? yes  Treat wet prep per standing order Discussed time line for [facility] results and that patient will be called with positive results and encouraged patient to call if she had not heard in 2 weeks.  Counseled to return or seek care for continued or worsening symptoms Recommended repeat testing in 3 months with positive results. Recommended condom use with all  sex for STI prevention.   Patient is currently using *Nexplanon  to prevent pregnancy.    Return if symptoms worsen or fail to improve, for STI screening.  No future appointments.  Earleen Glazier, [place]

## 2023-08-21 LAB — SYPHILIS SEROLOGY, ~~LOC~~ LAB: RPR: NONREACTIVE

## 2023-08-24 LAB — HBV ANTIGEN/ANTIBODY STATE LAB
Hep B Core Total Ab: NONREACTIVE
Hep B S Ab: NONREACTIVE
Hepatitis B Surface Antigen: NONREACTIVE

## 2023-09-01 ENCOUNTER — Encounter: Payer: Self-pay | Admitting: Family Medicine

## 2023-11-13 ENCOUNTER — Emergency Department: Payer: MEDICAID

## 2023-11-13 ENCOUNTER — Other Ambulatory Visit: Payer: Self-pay

## 2023-11-13 ENCOUNTER — Inpatient Hospital Stay
Admission: EM | Admit: 2023-11-13 | Discharge: 2023-11-15 | DRG: 158 | Disposition: A | Discharge status: Home / Self Care | Payer: MEDICAID | Attending: Obstetrics and Gynecology | Admitting: Obstetrics and Gynecology

## 2023-11-13 DIAGNOSIS — Z803 Family history of malignant neoplasm of breast: Secondary | ICD-10-CM

## 2023-11-13 DIAGNOSIS — F17210 Nicotine dependence, cigarettes, uncomplicated: Secondary | ICD-10-CM | POA: Diagnosis present

## 2023-11-13 DIAGNOSIS — Z833 Family history of diabetes mellitus: Secondary | ICD-10-CM

## 2023-11-13 DIAGNOSIS — M272 Inflammatory conditions of jaws: Secondary | ICD-10-CM | POA: Insufficient documentation

## 2023-11-13 DIAGNOSIS — L03211 Cellulitis of face: Principal | ICD-10-CM | POA: Diagnosis present

## 2023-11-13 DIAGNOSIS — Z9152 Personal history of nonsuicidal self-harm: Secondary | ICD-10-CM

## 2023-11-13 DIAGNOSIS — Z8349 Family history of other endocrine, nutritional and metabolic diseases: Secondary | ICD-10-CM

## 2023-11-13 DIAGNOSIS — K047 Periapical abscess without sinus: Principal | ICD-10-CM | POA: Diagnosis present

## 2023-11-13 DIAGNOSIS — Z8249 Family history of ischemic heart disease and other diseases of the circulatory system: Secondary | ICD-10-CM

## 2023-11-13 LAB — CBC WITH DIFFERENTIAL/PLATELET
Abs Immature Granulocytes: 0.04 K/uL (ref 0.00–0.07)
Basophils Absolute: 0.1 K/uL (ref 0.0–0.1)
Basophils Relative: 1 %
Eosinophils Absolute: 0.2 K/uL (ref 0.0–0.5)
Eosinophils Relative: 2 %
HCT: 35.9 % — ABNORMAL LOW (ref 36.0–46.0)
Hemoglobin: 11.6 g/dL — ABNORMAL LOW (ref 12.0–15.0)
Immature Granulocytes: 0 %
Lymphocytes Relative: 19 %
Lymphs Abs: 2.1 K/uL (ref 0.7–4.0)
MCH: 31.1 pg (ref 26.0–34.0)
MCHC: 32.3 g/dL (ref 30.0–36.0)
MCV: 96.2 fL (ref 80.0–100.0)
Monocytes Absolute: 0.6 K/uL (ref 0.1–1.0)
Monocytes Relative: 5 %
Neutro Abs: 8.1 K/uL — ABNORMAL HIGH (ref 1.7–7.7)
Neutrophils Relative %: 73 %
Platelets: 302 K/uL (ref 150–400)
RBC: 3.73 MIL/uL — ABNORMAL LOW (ref 3.87–5.11)
RDW: 14 % (ref 11.5–15.5)
WBC: 11 K/uL — ABNORMAL HIGH (ref 4.0–10.5)
nRBC: 0 % (ref 0.0–0.2)

## 2023-11-13 LAB — BASIC METABOLIC PANEL WITH GFR
Anion gap: 10 (ref 5–15)
BUN: 15 mg/dL (ref 6–20)
CO2: 24 mmol/L (ref 22–32)
Calcium: 9.3 mg/dL (ref 8.9–10.3)
Chloride: 108 mmol/L (ref 98–111)
Creatinine, Ser: 0.73 mg/dL (ref 0.44–1.00)
GFR, Estimated: >60 mL/min (ref >60)
Glucose, Bld: 76 mg/dL (ref 70–99)
Potassium: 4 mmol/L (ref 3.5–5.1)
Sodium: 142 mmol/L (ref 135–145)

## 2023-11-13 LAB — URINE DRUG SCREEN, QUALITATIVE (ARMC ONLY)
Amphetamines, Ur Screen: NOT DETECTED
Barbiturates, Ur Screen: NOT DETECTED
Benzodiazepine, Ur Scrn: NOT DETECTED
Cannabinoid 50 Ng, Ur ~~LOC~~: POSITIVE — AB
Cocaine Metabolite,Ur ~~LOC~~: NOT DETECTED
MDMA (Ecstasy)Ur Screen: NOT DETECTED
Methadone Scn, Ur: NOT DETECTED
Opiate, Ur Screen: POSITIVE — AB
Phencyclidine (PCP) Ur S: NOT DETECTED
Tricyclic, Ur Screen: NOT DETECTED

## 2023-11-13 LAB — URINALYSIS, ROUTINE W REFLEX MICROSCOPIC
Bilirubin Urine: NEGATIVE
Glucose, UA: NEGATIVE mg/dL
Hgb urine dipstick: NEGATIVE
Ketones, ur: NEGATIVE mg/dL
Leukocytes,Ua: NEGATIVE
Nitrite: NEGATIVE
Protein, ur: NEGATIVE mg/dL
Specific Gravity, Urine: 1.04 — ABNORMAL HIGH (ref 1.005–1.030)
pH: 6 (ref 5.0–8.0)

## 2023-11-13 LAB — HIV ANTIBODY (ROUTINE TESTING W REFLEX): HIV Screen 4th Generation wRfx: NONREACTIVE

## 2023-11-13 LAB — LACTIC ACID, PLASMA
Lactic Acid, Venous: 0.6 mmol/L (ref 0.5–1.9)
Lactic Acid, Venous: 1.4 mmol/L (ref 0.5–1.9)

## 2023-11-13 MED ORDER — IOHEXOL 300 MG/ML  SOLN
75.0000 mL | Freq: Once | INTRAMUSCULAR | Status: AC | PRN
  Administered 2023-11-13: 75 mL via INTRAVENOUS

## 2023-11-13 MED ORDER — KETOROLAC TROMETHAMINE 30 MG/ML IJ SOLN
30.0000 mg | Freq: Four times a day (QID) | INTRAMUSCULAR | Status: DC | PRN
  Administered 2023-11-13 – 2023-11-15 (×6): 30 mg via INTRAVENOUS
  Filled 2023-11-13 (×6): qty 1

## 2023-11-13 MED ORDER — MORPHINE SULFATE (PF) 4 MG/ML IV SOLN
4.0000 mg | Freq: Once | INTRAVENOUS | Status: AC
  Administered 2023-11-13: 4 mg via INTRAVENOUS
  Filled 2023-11-13: qty 1

## 2023-11-13 MED ORDER — SODIUM CHLORIDE 0.9 % IV BOLUS
1000.0000 mL | Freq: Once | INTRAVENOUS | Status: AC
  Administered 2023-11-13: 1000 mL via INTRAVENOUS

## 2023-11-13 MED ORDER — PIPERACILLIN-TAZOBACTAM 3.375 G IVPB
3.3750 g | Freq: Three times a day (TID) | INTRAVENOUS | Status: DC
  Administered 2023-11-13 – 2023-11-14 (×3): 3.375 g via INTRAVENOUS
  Filled 2023-11-13 (×3): qty 50

## 2023-11-13 MED ORDER — ONDANSETRON HCL 4 MG PO TABS: 4.0000 mg | ORAL_TABLET | Freq: Four times a day (QID) | ORAL | Status: DC | PRN

## 2023-11-13 MED ORDER — HEPARIN SODIUM (PORCINE) 5000 UNIT/ML IJ SOLN
5000.0000 [IU] | Freq: Three times a day (TID) | INTRAMUSCULAR | Status: DC
  Administered 2023-11-13 – 2023-11-14 (×2): 5000 [IU] via SUBCUTANEOUS
  Filled 2023-11-13 (×2): qty 1

## 2023-11-13 MED ORDER — TRAMADOL HCL 50 MG PO TABS
50.0000 mg | ORAL_TABLET | Freq: Three times a day (TID) | ORAL | Status: DC | PRN
  Administered 2023-11-13 – 2023-11-14 (×2): 50 mg via ORAL
  Filled 2023-11-13 (×2): qty 1

## 2023-11-13 MED ORDER — ALBUTEROL SULFATE (2.5 MG/3ML) 0.083% IN NEBU: 2.5000 mg | INHALATION_SOLUTION | RESPIRATORY_TRACT | Status: DC | PRN

## 2023-11-13 MED ORDER — KETOROLAC TROMETHAMINE 30 MG/ML IJ SOLN: 30.0000 mg | Freq: Four times a day (QID) | INTRAMUSCULAR | Status: DC | PRN

## 2023-11-13 MED ORDER — KETOROLAC TROMETHAMINE 15 MG/ML IJ SOLN
15.0000 mg | Freq: Once | INTRAMUSCULAR | Status: AC
Start: 2023-11-13 — End: 2023-11-13
  Administered 2023-11-13: 15 mg via INTRAVENOUS
  Filled 2023-11-13: qty 1

## 2023-11-13 MED ORDER — ONDANSETRON HCL 4 MG/2ML IJ SOLN: 4.0000 mg | Freq: Four times a day (QID) | INTRAMUSCULAR | Status: DC | PRN

## 2023-11-13 MED ORDER — LACTATED RINGERS IV SOLN: INTRAVENOUS | Status: DC

## 2023-11-13 MED ORDER — AMPICILLIN-SULBACTAM SODIUM 3 (2-1) G IJ SOLR
3.0000 g | Freq: Once | INTRAMUSCULAR | Status: AC
  Administered 2023-11-13: 3 g via INTRAVENOUS
  Filled 2023-11-13: qty 8

## 2023-11-13 NOTE — ED Provider Notes (Signed)
 [facility] Provider Note    Event Date/Time   First MD Initiated Contact with Patient 11/13/23 [phone]     (approximate)   History   Abscess and Dental Pain   HPI  [name] is a 28 y.o. female who presents today for evaluation of left-sided facial swelling for the past 2 days.  Patient reports that she has had problems with her teeth for a while and began to have pain 2 days ago.  She reports that her pain worsened yesterday and she developed swelling to her face.  She took an antibiotic that she had at home without improvement of her symptoms.  No voice change or trouble swallowing.  No fevers or chills.  Patient Active Problem List   Diagnosis Date Noted   Facial cellulitis 11/13/2023   History of cesarean delivery 02/15/2023   Drug use affecting pregnancy in third trimester 01/15/2023   History of preterm delivery 12/24/2022   MDD (major depressive disorder), single episode, moderate (HCC) 09/18/2016   Deliberate self-cutting 09/17/2016   Tobacco use disorder 09/17/2016   Cannabis use disorder, moderate, dependence (HCC) 09/17/2016          Physical Exam   Triage Vital Signs: ED Triage Vitals  Encounter Vitals Group     BP 11/13/23 0922 (!) 150/98     Girls Systolic BP Percentile --      Girls Diastolic BP Percentile --      Boys Systolic BP Percentile --      Boys Diastolic BP Percentile --      Pulse Rate 11/13/23 0922 (!) 111     Resp 11/13/23 0922 20     Temp 11/13/23 0922 98.4 F (36.9 C)     Temp Source 11/13/23 0922 Oral     SpO2 11/13/23 0922 100 %     Weight 11/13/23 0923 110 lb (49.9 kg)     Height 11/13/23 0923 5' 4 (1.626 m)     Head Circumference --      Peak Flow --      Pain Score 11/13/23 0921 10     Pain Loc --      Pain Education --      Exclude from Growth Chart --     Most recent vital signs: Vitals:   11/13/23 1145 11/13/23 1146  BP:    Pulse: (!) 101   Resp:    Temp:  98.6 F (37 C)  SpO2:  100%     Physical Exam Vitals and nursing note reviewed.  Constitutional:      General: Awake and alert. No acute distress.    Appearance: Normal appearance. The patient is normal weight.  HENT:     Head: Normocephalic and atraumatic.     Mouth: Mucous membranes are moist.  Poor dentition diffusely.  Swelling noted to the left side of the face extending superiorly to just below her left eye.  Normal and full extraocular movements without pain.  No proptosis.  No sublingual swelling noted.  No gingival fluctuance noted. Eyes:     General: PERRL. Normal EOMs        Right eye: No discharge.        Left eye: No discharge.     Conjunctiva/sclera: Conjunctivae normal.  Cardiovascular:     Rate and Rhythm: Tachycardic rate and regular rhythm.     Pulses: Normal pulses.  Pulmonary:     Effort: Pulmonary effort is normal. No respiratory distress.  Breath sounds: Normal breath sounds.  Abdominal:     Abdomen is soft. There is no abdominal tenderness. No rebound or guarding. No distention. Musculoskeletal:        General: No swelling. Normal range of motion.     Cervical back: Normal range of motion and neck supple.  Skin:    General: Skin is warm and dry.     Capillary Refill: Capillary refill takes less than 2 seconds.     Findings: No rash.  Neurological:     Mental Status: The patient is awake and alert.      ED Results / Procedures / Treatments   Labs (all labs ordered are listed, but only abnormal results are displayed) Labs Reviewed  CBC WITH DIFFERENTIAL/PLATELET - Abnormal; Notable for the following components:      Result Value   WBC 11.0 (*)    RBC 3.73 (*)    Hemoglobin 11.6 (*)    HCT 35.9 (*)    Neutro Abs 8.1 (*)    All other components within normal limits  URINALYSIS, ROUTINE W REFLEX MICROSCOPIC - Abnormal; Notable for the following components:   Color, Urine STRAW (*)    APPearance CLEAR (*)    Specific Gravity, Urine 1.040 (*)    All other components  within normal limits  CULTURE, BLOOD (ROUTINE X 2)  CULTURE, BLOOD (ROUTINE X 2)  BASIC METABOLIC PANEL WITH GFR  LACTIC ACID, PLASMA  LACTIC ACID, PLASMA  HIV ANTIBODY (ROUTINE TESTING W REFLEX)  CBC  CREATININE, SERUM     EKG     RADIOLOGY I independently reviewed and interpreted imaging and agree with radiologists findings.     PROCEDURES:  Critical Care performed:   Procedures   MEDICATIONS ORDERED IN ED: Medications  heparin  injection 5,000 Units (has no administration in time range)  lactated ringers  infusion (has no administration in time range)  traMADol  (ULTRAM ) tablet 50 mg (has no administration in time range)  ketorolac  (TORADOL ) 30 MG/ML injection 30 mg (has no administration in time range)  ondansetron  (ZOFRAN ) tablet 4 mg (has no administration in time range)    Or  ondansetron  (ZOFRAN ) injection 4 mg (has no administration in time range)  albuterol  (PROVENTIL ) (2.5 MG/3ML) 0.083% nebulizer solution 2.5 mg (has no administration in time range)  ketorolac  (TORADOL ) 15 MG/ML injection 15 mg (15 mg Intravenous Given 11/13/23 0946)  Ampicillin -Sulbactam (UNASYN ) 3 g in sodium chloride  0.9 % 100 mL IVPB (0 g Intravenous Stopped 11/13/23 1018)  iohexol  (OMNIPAQUE ) 300 MG/ML solution 75 mL (75 mLs Intravenous Contrast Given 11/13/23 1028)  morphine  (PF) 4 MG/ML injection 4 mg (4 mg Intravenous Given 11/13/23 1056)  sodium chloride  0.9 % bolus 1,000 mL (1,000 mLs Intravenous New Bag/Given 11/13/23 1204)     IMPRESSION / MDM / ASSESSMENT AND PLAN / ED COURSE  I reviewed the triage vital signs and the nursing notes.   Differential diagnosis includes, but is not limited to, dental caries, dental abscess, cellulitis.  Patient is awake and alert, tachycardic on arrival though normotensive and afebrile.  She is nontoxic in appearance though she has obvious facial swelling extending superiorly into her left periorbital area.  However, she has full and normal extraocular  movements without pain, no lid edema, not consistent with  orbital cellulitis.  There is no proptosis.  She has poor dentition diffusely, though there is no appreciable focal area of fluctuance intraorally to suggest drainable abscess.  She has no sublingual swelling or woodiness to suggest [employer]  angina.  No nuchal rigidity or neck pain to suggest deeper space infection.  Labs obtained reveal a leukocytosis to 11.  She then had an increased heart rate to 125, and lactate and blood cultures were obtained, though her lactate is normal.  She was given Unasyn  for antibiotic coverage, and was treated with Toradol  and subsequently morphine  for pain control with good effect.  She was given normal saline.  CT scan obtained reveals no focal drainable fluid collection, though she has an odontogenic left facial cellulitis.  I recommended admission to the hospitalist service, patient is in agreement with this.  I consulted the hospitalist for admission.  Patient was accepted by Dr. Debby.  Patient's presentation is most consistent with acute presentation with potential threat to life or bodily function.      FINAL CLINICAL IMPRESSION(S) / ED DIAGNOSES   Final diagnoses:  Facial cellulitis     Rx / DC Orders   ED Discharge Orders     None        Note:  This document was prepared using Dragon voice recognition software and may include unintentional dictation errors.   Venice Liz E, PA-C 11/13/23 1327    Bradler, Evan K, MD 11/14/23 [phone]

## 2023-11-13 NOTE — ED Notes (Signed)
 Pt alert and oriented x4, nad noted. Pt using bathroom at this time. Left jaw/face notably swollen.

## 2023-11-13 NOTE — ED Triage Notes (Signed)
 Pt to ED for L facial swelling and dental pain. Dental pain started 2 days ago. Facial swelling started this AM. Area under L eye is also swollen.

## 2023-11-13 NOTE — ED Notes (Signed)
 Patient was unhooked from NS and ambulated to hallway bathroom with a steady gait.
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
## 2023-11-14 DIAGNOSIS — M272 Inflammatory conditions of jaws: Secondary | ICD-10-CM | POA: Insufficient documentation

## 2023-11-14 LAB — COMPREHENSIVE METABOLIC PANEL WITH GFR
ALT: 14 U/L (ref 0–44)
AST: 17 U/L (ref 15–41)
Albumin: 3.2 g/dL — ABNORMAL LOW (ref 3.5–5.0)
Alkaline Phosphatase: 50 U/L (ref 38–126)
Anion gap: 6 (ref 5–15)
BUN: 11 mg/dL (ref 6–20)
CO2: 26 mmol/L (ref 22–32)
Calcium: 8.9 mg/dL (ref 8.9–10.3)
Chloride: 108 mmol/L (ref 98–111)
Creatinine, Ser: 0.52 mg/dL (ref 0.44–1.00)
GFR, Estimated: >60 mL/min (ref >60)
Glucose, Bld: 97 mg/dL (ref 70–99)
Potassium: 4.2 mmol/L (ref 3.5–5.1)
Sodium: 140 mmol/L (ref 135–145)
Total Bilirubin: 0.4 mg/dL (ref 0.0–1.2)
Total Protein: 6.2 g/dL — ABNORMAL LOW (ref 6.5–8.1)

## 2023-11-14 LAB — CBC
HCT: 29.5 % — ABNORMAL LOW (ref 36.0–46.0)
Hemoglobin: 9.3 g/dL — ABNORMAL LOW (ref 12.0–15.0)
MCH: 30.4 pg (ref 26.0–34.0)
MCHC: 31.5 g/dL (ref 30.0–36.0)
MCV: 96.4 fL (ref 80.0–100.0)
Platelets: 238 K/uL (ref 150–400)
RBC: 3.06 MIL/uL — ABNORMAL LOW (ref 3.87–5.11)
RDW: 13.9 % (ref 11.5–15.5)
WBC: 7 K/uL (ref 4.0–10.5)
nRBC: 0 % (ref 0.0–0.2)

## 2023-11-14 LAB — HCG, QUANTITATIVE, PREGNANCY: hCG, Beta Chain, Quant, S: <1 m[IU]/mL (ref <5)

## 2023-11-14 MED ORDER — ENOXAPARIN SODIUM 40 MG/0.4ML IJ SOSY
40.0000 mg | PREFILLED_SYRINGE | INTRAMUSCULAR | Status: DC
  Administered 2023-11-14: 40 mg via SUBCUTANEOUS
  Filled 2023-11-14: qty 0.4

## 2023-11-14 MED ORDER — OXYCODONE HCL 5 MG PO TABS
5.0000 mg | ORAL_TABLET | Freq: Once | ORAL | Status: AC
  Administered 2023-11-14: 5 mg via ORAL
  Filled 2023-11-14: qty 1

## 2023-11-14 MED ORDER — SODIUM CHLORIDE 0.9 % IV SOLN
3.0000 g | Freq: Four times a day (QID) | INTRAVENOUS | Status: DC
  Administered 2023-11-14 – 2023-11-15 (×5): 3 g via INTRAVENOUS
  Filled 2023-11-14 (×6): qty 8

## 2023-11-14 MED ORDER — DOCUSATE SODIUM 100 MG PO CAPS
200.0000 mg | ORAL_CAPSULE | Freq: Every day | ORAL | Status: DC | PRN
  Administered 2023-11-14: 200 mg via ORAL
  Filled 2023-11-14: qty 2

## 2023-11-14 NOTE — Discharge Instructions (Signed)
 Dental Resources [place]  [employer]: [phone] [facility] Services: [phone]  Health Department: [phone]   You are encouraged to [facility] and arrange an application appointment to be screened for service to get set up with a physician for primary care  You may print the application and take with you to the appointment. At this web address [email or web address] Please Call [facility] for appointment  [phone]  [address] [place], [place] [address]  Office Hours Monday: Closed Tuesday: 9:00am - 4:00pm Wednesday: 9:00am - 4:00pm Thursday: 9:00am - 8:00pm Friday: Closed  Endocrinology 2nd Thursday of the Month: 5:00pm - 8:00pm  Rheumatology/Orthopedic Clinic By appointment only.  Contact for availability.     Services Not Covered Obstetrics Gastrointestinal/Liver Disease

## 2023-11-14 NOTE — TOC CM/SW Note (Signed)
 Dental resources researched and added to AVS.  Rollande Thursby, LCSW [phone]

## 2023-11-14 NOTE — Plan of Care (Signed)
  Problem: Education: Goal: Knowledge of General Education information will improve Description: Including pain rating scale, medication(s)/side effects and non-pharmacologic comfort measures Outcome: Progressing   Problem: Health Behavior/Discharge Planning: Goal: Ability to manage health-related needs will improve Outcome: Progressing   Problem: Clinical Measurements: Goal: Ability to maintain clinical measurements within normal limits will improve Outcome: Progressing Goal: Will remain free from infection Outcome: Progressing Goal: Diagnostic test results will improve Outcome: Progressing Goal: Respiratory complications will improve Outcome: Progressing Goal: Cardiovascular complication will be avoided Outcome: Progressing   Problem: Nutrition: Goal: Adequate nutrition will be maintained Outcome: Progressing   Problem: Coping: Goal: Level of anxiety will decrease Outcome: Progressing   Problem: Elimination: Goal: Will not experience complications related to bowel motility Outcome: Progressing Goal: Will not experience complications related to urinary retention Outcome: Progressing   Problem: Pain Managment: Goal: General experience of comfort will improve and/or be controlled Outcome: Progressing   Problem: Safety: Goal: Ability to remain free from injury will improve Outcome: Progressing   Problem: Skin Integrity: Goal: Risk for impaired skin integrity will decrease Outcome: Progressing   Problem: Clinical Measurements: Goal: Ability to avoid or minimize complications of infection will improve Outcome: Progressing   Problem: Skin Integrity: Goal: Skin integrity will improve Outcome: Progressing

## 2023-11-14 NOTE — Progress Notes (Signed)
 PROGRESS NOTE    [name]  FMW:[id number] DOB: Mar 11, 1996 DOA: 11/13/2023 PCP: Pcp, No  Outpatient Specialists: none    Brief Narrative:   From admission h and p  [name] is a 28 y.o. female with medical history significant of tobacco abuse, prior hx of methamphetamine use, marijuana use  MDD who presents to ED with 2 days of dental and facial pain and this am note significant swelling and pain to the left face. She note fever but does endorse chills. She also note no sore throat or difficulty swallowing.      Assessment & Plan:   Principal Problem:   Facial cellulitis Active Problems:   Odontogenic infection of jaw   # Left odontogenic facial infection CT of face shows left maxillary anterior molar periapical lucency with regional cellulitis, tiny developing subperiosteal abscess not organized or drainable. Started on unasyn  in the ER, broadened to zosyn  by admitting MD. Patient reports mild improvement in pain and swelling today, nothing draining. Dr. Blair of ENT says no intervention indicated - d/c fluids, patient not septic and is tolerating PO - advance diet - likely d/c tomorrow assuming continued progress. Would repeat CT if not - f/u hcg   DVT prophylaxis: lovenox  Code Status: full Family Communication: none  Level of care: Telemetry Medical Status is: Inpatient Remains inpatient appropriate because: need for IV abx    Consultants:  none  Procedures: none  Antimicrobials:  Unasyn  > zosyn  > unasyn     Subjective: Reports interval improvement in pain and swelling, though mild  Objective: Vitals:   11/13/23 2034 11/13/23 2226 11/14/23 0229 11/14/23 0602  BP: 121/67 133/85 127/74 135/84  Pulse: (!) 112 100 (!) 101 94  Resp: 18 18 16 16   Temp: 98.7 F (37.1 C) 98.1 F (36.7 C) 98.6 F (37 C) 98.1 F (36.7 C)  TempSrc:   Oral Oral  SpO2: 99% 99% 99% 100%  Weight:      Height:        Intake/Output Summary (Last 24 hours)  at 11/14/2023 0926 Last data filed at 11/13/2023 1900 Gross per 24 hour  Intake 1000 ml  Output --  Net 1000 ml   Filed Weights   11/13/23 0923  Weight: 49.9 kg    Examination:  General exam: Appears calm and comfortable.  ENT: left facial and lower eyelid swelling. No fluctuance in mouth. Missing most of left anterior molar. Respiratory system: Clear to auscultation.  Cardiovascular system: S1 & S2 heard, RRR.   Gastrointestinal system: Abdomen is nondistended, soft and nontender.  Central nervous system: Alert and oriented. No focal neurological deficits. Extremities: Symmetric 5 x 5 power. Skin: No rashes, lesions or ulcers Psychiatry: Judgement and insight appear normal. Mood & affect appropriate.     Data Reviewed: I have personally reviewed following labs and imaging studies  CBC: Recent Labs  Lab 11/13/23 0925 11/14/23 0552  WBC 11.0* 7.0  NEUTROABS 8.1*  --   HGB 11.6* 9.3*  HCT 35.9* 29.5*  MCV 96.2 96.4  PLT 302 238   Basic Metabolic Panel: Recent Labs  Lab 11/13/23 0925 11/14/23 0552  NA 142 140  K 4.0 4.2  CL 108 108  CO2 24 26  GLUCOSE 76 97  BUN 15 11  CREATININE 0.73 0.52  CALCIUM  9.3 8.9   GFR: Estimated Creatinine Clearance: 82.5 mL/min (by C-G formula based on SCr of 0.52 mg/dL). Liver Function Tests: Recent Labs  Lab 11/14/23 0552  AST 17  ALT 14  ALKPHOS 50  BILITOT 0.4  PROT 6.2*  ALBUMIN 3.2*   No results for input(s): LIPASE, AMYLASE in the last 168 hours. No results for input(s): AMMONIA in the last 168 hours. Coagulation Profile: No results for input(s): INR, PROTIME in the last 168 hours. Cardiac Enzymes: No results for input(s): CKTOTAL, CKMB, CKMBINDEX, TROPONINI in the last 168 hours. BNP (last 3 results) No results for input(s): PROBNP in the last 8760 hours. HbA1C: No results for input(s): HGBA1C in the last 72 hours. CBG: No results for input(s): GLUCAP in the last 168 hours. Lipid  Profile: No results for input(s): CHOL, HDL, LDLCALC, TRIG, CHOLHDL, LDLDIRECT in the last 72 hours. Thyroid Function Tests: No results for input(s): TSH, T4TOTAL, FREET4, T3FREE, THYROIDAB in the last 72 hours. Anemia Panel: No results for input(s): VITAMINB12, FOLATE, FERRITIN, TIBC, IRON , RETICCTPCT in the last 72 hours. Urine analysis:    Component Value Date/Time   COLORURINE STRAW (A) 11/13/2023 1209   APPEARANCEUR CLEAR (A) 11/13/2023 1209   APPEARANCEUR Clear 01/15/2023 1515   LABSPEC 1.040 (H) 11/13/2023 1209   LABSPEC 1.006 01/27/2013 2028   PHURINE 6.0 11/13/2023 1209   GLUCOSEU NEGATIVE 11/13/2023 1209   GLUCOSEU Negative 01/27/2013 2028   HGBUR NEGATIVE 11/13/2023 1209   BILIRUBINUR NEGATIVE 11/13/2023 1209   BILIRUBINUR Negative 01/15/2023 1515   BILIRUBINUR Negative 01/27/2013 2028   KETONESUR NEGATIVE 11/13/2023 1209   PROTEINUR NEGATIVE 11/13/2023 1209   UROBILINOGEN 0.2 08/19/2017 1550   NITRITE NEGATIVE 11/13/2023 1209   LEUKOCYTESUR NEGATIVE 11/13/2023 1209   LEUKOCYTESUR 2+ 01/27/2013 2028   Sepsis Labs: @LABRCNTIP (procalcitonin:4,lacticidven:4)  ) Recent Results (from the past 240 hours)  Culture, blood (routine x 2)     Status: None (Preliminary result)   Collection Time: 11/13/23 11:55 AM   Specimen: BLOOD  Result Value Ref Range Status   Specimen Description BLOOD RIGHT ANTECUBITAL  Final   Special Requests   Final    BOTTLES DRAWN AEROBIC AND ANAEROBIC Blood Culture adequate volume   Culture   Final    NO GROWTH < 24 HOURS Performed at [facility], [address], [place], [place] [address]    Report Status PENDING  Incomplete  Culture, blood (routine x 2)     Status: None (Preliminary result)   Collection Time: 11/13/23 12:06 PM   Specimen: BLOOD  Result Value Ref Range Status   Specimen Description BLOOD BLOOD LEFT FOREARM  Final   Special Requests   Final    BOTTLES DRAWN AEROBIC AND ANAEROBIC  Blood Culture results may not be optimal due to an inadequate volume of blood received in culture bottles   Culture   Final    NO GROWTH < 24 HOURS Performed at [facility], [address] [place], [place] [address]    Report Status PENDING  Incomplete         Radiology Studies: CT Maxillofacial W Contrast Result Date: 11/13/2023 CLINICAL DATA:  28 year old female with left facial swelling and dental pain onset 2 days ago. EXAM: CT MAXILLOFACIAL WITH CONTRAST TECHNIQUE: Multidetector CT imaging of the maxillofacial structures was performed with intravenous contrast. Multiplanar CT image reconstructions were also generated. RADIATION DOSE REDUCTION: This exam was performed according to the departmental dose-optimization program which includes automated exposure control, adjustment of the mA and/or kV according to patient size and/or use of iterative reconstruction technique. CONTRAST:  75mL OMNIPAQUE  IOHEXOL  300 MG/ML  SOLN COMPARISON:  Head CT 01/18/2012. FINDINGS: Osseous: Mandible intact and normally located. Bilateral carious  posterior dentition. In particular, highly carious left maxillary anterior molar with periapical lucency, lateral cortical dehiscence (series 5, image 40), and may also might communicate with the left maxillary sinus alveolar recess. Bilateral maxilla, zygoma, pterygoid, and nasal bones otherwise intact. Visible calvarium and central skull base appear intact. Orbits: No orbital wall fracture. Globes and intraorbital soft tissues appear normal. Sinuses: Left maxillary alveolar recess mucosal thickening. Otherwise well aerated. Visible tympanic cavities, mastoids, petrous apex air cells appear clear. Soft tissues: Confluent soft tissue swelling and stranding left upper buccal space along the maxillary alveolus, and tracking to the left premalar space, left orbit inferior preseptal space. No soft tissue gas. Evidence of tiny developing subperiosteal abscess overlying  the area of maxillary cortical dehiscence series 3, image 39, but no organized or drainable fluid collection at this time. Negative visible thyroid, larynx, pharynx, parapharyngeal spaces, retropharyngeal space, sublingual space and submandibular spaces. Inflammation in the anterior and inferior left masticator space. Left parotid space, contralateral right masticator and parotid spaces remain normal. Major vascular structures in the visible neck and at the skull base are enhancing and patent. Limited intracranial: Negative. IMPRESSION: 1. Odontogenic left facial infection. Carious left maxillary anterior molar with periapical lucency and lateral cortical dehiscence. Regional cellulitis. Tiny developing subperiosteal abscess at the site of dehiscence but no organized or drainable fluid collection. 2. Mild left maxillary sinus inflammation also probably odontogenic. Electronically Signed   By: VEAR Hurst M.D.   On: 11/13/2023 10:54        Scheduled Meds:  enoxaparin  (LOVENOX ) injection  40 mg Subcutaneous Q24H   Continuous Infusions:   LOS: 1 day     Devaughn KATHEE Ban, MD [facility]   If 7PM-7AM, please contact night-coverage [email or web address] Password [email or web address] 11/14/2023, 9:26 AM

## 2023-11-15 ENCOUNTER — Other Ambulatory Visit: Payer: Self-pay

## 2023-11-15 LAB — CBC
HCT: 32 % — ABNORMAL LOW (ref 36.0–46.0)
Hemoglobin: 10.7 g/dL — ABNORMAL LOW (ref 12.0–15.0)
MCH: 31.1 pg (ref 26.0–34.0)
MCHC: 33.4 g/dL (ref 30.0–36.0)
MCV: 93 fL (ref 80.0–100.0)
Platelets: 289 K/uL (ref 150–400)
RBC: 3.44 MIL/uL — ABNORMAL LOW (ref 3.87–5.11)
RDW: 13.6 % (ref 11.5–15.5)
WBC: 7.3 K/uL (ref 4.0–10.5)
nRBC: 0 % (ref 0.0–0.2)

## 2023-11-15 MED ORDER — IBUPROFEN 400 MG PO TABS
600.0000 mg | ORAL_TABLET | Freq: Four times a day (QID) | ORAL | Status: DC | PRN
  Administered 2023-11-15: 600 mg via ORAL
  Filled 2023-11-15: qty 2

## 2023-11-15 MED ORDER — ACETAMINOPHEN 325 MG PO TABS
650.0000 mg | ORAL_TABLET | Freq: Four times a day (QID) | ORAL | Status: DC
  Administered 2023-11-15: 650 mg via ORAL
  Filled 2023-11-15: qty 2

## 2023-11-15 MED ORDER — AMOXICILLIN-POT CLAVULANATE 875-125 MG PO TABS
1.0000 | ORAL_TABLET | Freq: Two times a day (BID) | ORAL | 0 refills | Status: AC
  Filled 2023-11-15: qty 24, 12d supply, fill #0

## 2023-11-15 NOTE — Discharge Summary (Signed)
 [name] FMW:[id number] DOB: 1995/10/22 DOA: 11/13/2023  PCP: Pcp, No  Admit date: 11/13/2023 Discharge date: 11/15/2023  Time spent: 35 minutes  Recommendations for Outpatient Follow-up:  Close dental f/u     Discharge Diagnoses:  Principal Problem:   Facial cellulitis Active Problems:   Odontogenic infection of jaw   Discharge Condition: improving  Diet recommendation: regular  Filed Weights   11/13/23 0923  Weight: 49.9 kg    History of present illness:  From admission h and p  [name] is a 28 y.o. female with medical history significant of tobacco abuse, prior hx of methamphetamine use, marijuana use  MDD who presents to ED with 2 days of dental and facial pain and this am note significant swelling and pain to the left face. She note fever but does endorse chills. She also note no sore throat or difficulty swallowing.   Hospital Course:   # Left odontogenic facial infection CT of face shows left maxillary anterior molar periapical lucency with regional cellulitis, tiny developing subperiosteal abscess not organized or drainable. Dr. Blair of ENT says no intervention indicated. Started on unasyn  in the ER, broadened to zosyn  by admitting MD, narrowed again to [place] on hospital day one. Now s/p 2 days IV abx and has shown marked improvement in swelling and pain. - augmentin  for the next 5-12 days, until resolution of symptoms - close dental f/u (list of local resources provided)  Procedures: none   Consultations: none  Discharge Exam: Vitals:   11/14/23 2001 11/15/23 0809  BP: 134/76 126/77  Pulse: 96 95  Resp: 18 14  Temp: 98.5 F (36.9 C) 98.6 F (37 C)  SpO2: 100% 99%    General: NAD. Left sided facial swelling (significantly improved) Cardiovascular: rrr Respiratory: ctab  Discharge Instructions   Discharge Instructions     Diet - low sodium heart healthy   Complete by: As directed    Increase activity slowly   Complete by: As  directed       Allergies as of 11/15/2023   No Known Allergies      Medication List     STOP taking these medications    Fusion Plus Caps   ondansetron  4 MG disintegrating tablet Commonly known as: ZOFRAN -ODT   Prenatal Vitamins 0.8 MG tablet       TAKE these medications    acetaminophen  500 MG tablet Commonly known as: TYLENOL  Take 2 tablets (1,000 mg total) by mouth every 6 (six) hours.   amoxicillin -clavulanate 875-125 MG tablet Commonly known as: AUGMENTIN  Take 1 tablet by mouth 2 (two) times daily for 12 days.   etonogestrel  68 MG Impl implant Commonly known as: NEXPLANON  1 each (68 mg total) by Subdermal route.   ibuprofen  800 MG tablet Commonly known as: ADVIL  Take 1 tablet (800 mg total) by mouth every 8 (eight) hours. What changed: Another medication with the same name was removed. Continue taking this medication, and follow the directions you see here.       No Known Allergies  Follow-up Information     Christy Alm Agent, MD Follow up.   Specialties: Obstetrics and Gynecology, Radiology Contact information: [address] [place] [address] [phone]                  The results of significant diagnostics from this hospitalization (including imaging, microbiology, ancillary and laboratory) are listed below for reference.    Significant Diagnostic Studies: CT Maxillofacial W Contrast Result Date: 11/13/2023 CLINICAL DATA:  28 year old female with left facial swelling and dental pain onset 2 days ago. EXAM: CT MAXILLOFACIAL WITH CONTRAST TECHNIQUE: Multidetector CT imaging of the maxillofacial structures was performed with intravenous contrast. Multiplanar CT image reconstructions were also generated. RADIATION DOSE REDUCTION: This exam was performed according to the departmental dose-optimization program which includes automated exposure control, adjustment of the mA and/or kV according to patient size and/or use of iterative  reconstruction technique. CONTRAST:  75mL OMNIPAQUE  IOHEXOL  300 MG/ML  SOLN COMPARISON:  Head CT 01/18/2012. FINDINGS: Osseous: Mandible intact and normally located. Bilateral carious posterior dentition. In particular, highly carious left maxillary anterior molar with periapical lucency, lateral cortical dehiscence (series 5, image 40), and may also might communicate with the left maxillary sinus alveolar recess. Bilateral maxilla, zygoma, pterygoid, and nasal bones otherwise intact. Visible calvarium and central skull base appear intact. Orbits: No orbital wall fracture. Globes and intraorbital soft tissues appear normal. Sinuses: Left maxillary alveolar recess mucosal thickening. Otherwise well aerated. Visible tympanic cavities, mastoids, petrous apex air cells appear clear. Soft tissues: Confluent soft tissue swelling and stranding left upper buccal space along the maxillary alveolus, and tracking to the left premalar space, left orbit inferior preseptal space. No soft tissue gas. Evidence of tiny developing subperiosteal abscess overlying the area of maxillary cortical dehiscence series 3, image 39, but no organized or drainable fluid collection at this time. Negative visible thyroid, larynx, pharynx, parapharyngeal spaces, retropharyngeal space, sublingual space and submandibular spaces. Inflammation in the anterior and inferior left masticator space. Left parotid space, contralateral right masticator and parotid spaces remain normal. Major vascular structures in the visible neck and at the skull base are enhancing and patent. Limited intracranial: Negative. IMPRESSION: 1. Odontogenic left facial infection. Carious left maxillary anterior molar with periapical lucency and lateral cortical dehiscence. Regional cellulitis. Tiny developing subperiosteal abscess at the site of dehiscence but no organized or drainable fluid collection. 2. Mild left maxillary sinus inflammation also probably odontogenic.  Electronically Signed   By: VEAR Hurst M.D.   On: 11/13/2023 10:54    Microbiology: Recent Results (from the past 240 hours)  Culture, blood (routine x 2)     Status: None (Preliminary result)   Collection Time: 11/13/23 11:55 AM   Specimen: BLOOD  Result Value Ref Range Status   Specimen Description BLOOD RIGHT ANTECUBITAL  Final   Special Requests   Final    BOTTLES DRAWN AEROBIC AND ANAEROBIC Blood Culture adequate volume   Culture   Final    NO GROWTH < 24 HOURS Performed at [facility], [address], [place], [place] [address]    Report Status PENDING  Incomplete  Culture, blood (routine x 2)     Status: None (Preliminary result)   Collection Time: 11/13/23 12:06 PM   Specimen: BLOOD  Result Value Ref Range Status   Specimen Description BLOOD BLOOD LEFT FOREARM  Final   Special Requests   Final    BOTTLES DRAWN AEROBIC AND ANAEROBIC Blood Culture results may not be optimal due to an inadequate volume of blood received in culture bottles   Culture   Final    NO GROWTH < 24 HOURS Performed at [facility], [address] [place], [place] [address]    Report Status PENDING  Incomplete     Labs: Basic Metabolic Panel: Recent Labs  Lab 11/13/23 0925 11/14/23 0552  NA 142 140  K 4.0 4.2  CL 108 108  CO2 24 26  GLUCOSE 76 97  BUN 15  11  CREATININE 0.73 0.52  CALCIUM  9.3 8.9   Liver Function Tests: Recent Labs  Lab 11/14/23 0552  AST 17  ALT 14  ALKPHOS 50  BILITOT 0.4  PROT 6.2*  ALBUMIN 3.2*   No results for input(s): LIPASE, AMYLASE in the last 168 hours. No results for input(s): AMMONIA in the last 168 hours. CBC: Recent Labs  Lab 11/13/23 0925 11/14/23 0552 11/15/23 0503  WBC 11.0* 7.0 7.3  NEUTROABS 8.1*  --   --   HGB 11.6* 9.3* 10.7*  HCT 35.9* 29.5* 32.0*  MCV 96.2 96.4 93.0  PLT 302 238 289   Cardiac Enzymes: No results for input(s): CKTOTAL, CKMB, CKMBINDEX, TROPONINI in the last 168 hours. BNP: BNP  (last 3 results) No results for input(s): BNP in the last 8760 hours.  ProBNP (last 3 results) No results for input(s): PROBNP in the last 8760 hours.  CBG: No results for input(s): GLUCAP in the last 168 hours.     Signed:  Devaughn KATHEE Ban MD.  [facility] 11/15/2023, 9:41 AM

## 2023-11-15 NOTE — TOC CM/SW Note (Signed)
 Transition of Care [facility]) - Inpatient Brief Assessment   Patient Details  Name: [name] MRN: [id number] Date of Birth: 11/04/1995  Transition of Care [facility]) CM/SW Contact:    Corean ONEIDA Haddock, RN Phone Number: 11/15/2023, 9:51 AM   Clinical Narrative:   Transition of Care [facility]) Screening Note   Patient Details  Name: [name] Date of Birth: [birth year]   Transition of Care [facility]) CM/SW Contact:    Corean ONEIDA Haddock, RN Phone Number: 11/15/2023, 9:51 AM    Transition of Care Department [facility]) has reviewed patient and no TOC needs have been identified at this time.  If new patient transition needs arise, please place a TOC consult.  [facility]  information added to AVS  Transition of Care Asessment: Insurance and Status: Selfpay Patient has primary care physician: No ([facility] information added to AVS)     Prior/Current Home Services: No current home services Social Drivers of Health Review: SDOH reviewed no interventions necessary Readmission risk has been reviewed: Yes Transition of care needs: no transition of care needs at this time

## 2023-11-18 LAB — CULTURE, BLOOD (ROUTINE X 2)
Culture: NO GROWTH
Culture: NO GROWTH
Special Requests: ADEQUATE

## 2023-12-11 ENCOUNTER — Emergency Department
Admission: EM | Admit: 2023-12-11 | Discharge: 2023-12-11 | Discharge status: Against Medical Advice | Payer: MEDICAID | Source: Home / Self Care
